# Patient Record
Sex: Female | Born: 1955 | Race: White | Hispanic: No | Marital: Married
Health system: Midwestern US, Community
[De-identification: ages and names within clinical notes are randomized; demographics above are authoritative.]

## PROBLEM LIST (undated history)

## (undated) DIAGNOSIS — M47816 Spondylosis without myelopathy or radiculopathy, lumbar region: Secondary | ICD-10-CM

## (undated) DIAGNOSIS — Z01818 Encounter for other preprocedural examination: Secondary | ICD-10-CM

## (undated) DIAGNOSIS — Z1231 Encounter for screening mammogram for malignant neoplasm of breast: Secondary | ICD-10-CM

## (undated) DIAGNOSIS — M961 Postlaminectomy syndrome, not elsewhere classified: Secondary | ICD-10-CM

## (undated) DIAGNOSIS — H9390 Unspecified disorder of ear, unspecified ear: Secondary | ICD-10-CM

## (undated) DIAGNOSIS — M461 Sacroiliitis, not elsewhere classified: Secondary | ICD-10-CM

## (undated) DIAGNOSIS — R52 Pain, unspecified: Secondary | ICD-10-CM

## (undated) DIAGNOSIS — R9230 Dense breasts, unspecified: Secondary | ICD-10-CM

## (undated) DIAGNOSIS — G894 Chronic pain syndrome: Secondary | ICD-10-CM

## (undated) DIAGNOSIS — R748 Abnormal levels of other serum enzymes: Secondary | ICD-10-CM

## (undated) DIAGNOSIS — R922 Inconclusive mammogram: Secondary | ICD-10-CM

## (undated) DIAGNOSIS — R079 Chest pain, unspecified: Secondary | ICD-10-CM

## (undated) DIAGNOSIS — Z1239 Encounter for other screening for malignant neoplasm of breast: Secondary | ICD-10-CM

## (undated) DIAGNOSIS — H6592 Unspecified nonsuppurative otitis media, left ear: Secondary | ICD-10-CM

## (undated) DIAGNOSIS — M4800 Spinal stenosis, site unspecified: Secondary | ICD-10-CM

## (undated) DIAGNOSIS — G4733 Obstructive sleep apnea (adult) (pediatric): Secondary | ICD-10-CM

## (undated) DIAGNOSIS — M5417 Radiculopathy, lumbosacral region: Secondary | ICD-10-CM

## (undated) DIAGNOSIS — Z9989 Dependence on other enabling machines and devices: Secondary | ICD-10-CM

## (undated) DIAGNOSIS — J209 Acute bronchitis, unspecified: Secondary | ICD-10-CM

## (undated) DIAGNOSIS — R519 Headache, unspecified: Secondary | ICD-10-CM

---

## 2009-08-25 LAB — GLOMERULAR FILTRATION RATE, ESTIMATED: Est, Glom Filt Rate: 75 mL/min/{1.73_m2} — AB

## 2009-08-25 LAB — LIPID PANEL
Cholesterol, Total: 175 mg/dl (ref 100–199)
HDL: 40 mg/dl
LDL Calculated: 92 mg/dl
Triglycerides: 216 mg/dl — ABNORMAL HIGH (ref 0–199)

## 2009-08-25 LAB — ELECTROLYTES
CO2: 29 meq/l (ref 23–33)
Chloride: 106 meq/l (ref 98–111)
Potassium: 3.9 meq/l (ref 3.5–5.2)
Sodium: 139 meq/l (ref 135–145)

## 2009-08-25 LAB — ALT/SGPT ALANINE TRANSAMINASE: ALT: 19 U/L (ref 11–66)

## 2009-08-25 LAB — GLUCOSE: Glucose: 115 mg/dl — ABNORMAL HIGH (ref 70–108)

## 2009-08-25 LAB — CREATININE: Creatinine: 0.8 mg/dl (ref 0.4–1.2)

## 2009-08-25 LAB — BUN: BUN: 16 mg/dl (ref 7–22)

## 2009-08-28 LAB — OCCULT BLOOD X 3, STOOL
Occult Blood Fecal: NEGATIVE
Occult Blood Fecal: NEGATIVE
Occult Blood Fecal: NEGATIVE

## 2010-02-09 NOTE — Progress Notes (Signed)
Subjective:      Patient ID: Theresa Vargas is a 55 y.o. female comes to the office today for a 6 month follow up for her ears. An Audiogram was completed on 11/04/09.    HPI SP LMAT 07-2009 (T-tube) by Dr Randell Loop.    Ears:         x No Complaints   Both  Right  Left     Onset:                            Insidious    Respiratory Infections    Barotrauma    Closed Head Injury    Ototoxic Drug Exposure    Noise Exposure    Previous Surgery       Ear Pain:   Pain Scale   /10 denies   Continuous    Intermittent     Precipitated by:    Relieved by:    Discharge from ear: denies   Mucoid    Pus    Mixed    Foul Smelling     Blood Stained     Hearing Loss: denies   Progressive    Improving    In status quo   x Poor discrimination in crowds    Hearing aids     Ringing in Ears: occasional buzzing   Pulsatile   x Non pulsatile    Continuous   x Intermittent    Sleep disturbance from tinnitus denies     Precipitated by:    Relieved by:    Dizziness: denies   Imbalances    Rotation    Nausea    Vomiting    Loss of COnsciousness    Positional    Duration     Previous Imaging:    Previous Labs:    Treatment so far:    Patient Active Problem List   Diagnoses   ??? OM (otitis media)   ??? ETD (eustachian tube dysfunction)       Past Medical History   Diagnosis Date   ??? Hypertension    ??? Psychiatric problem         Past Surgical History   Procedure Date   ??? Cholecystectomy 2007   ??? Hysterectomy 2007       Theresa Vargas does not currently have medications on file.    Allergies   Allergen Reactions   ??? Anesthetic Ether Other (See Comments)     Reaction to Local and general       Family History   Problem Relation Age of Onset   ??? Diabetes Other    ??? Hypertension Other    ??? Mental Illness Other    ??? Heart Disease Other    ??? Other Other      bone and joint problems       History     Social History   ??? Marital Status: Married     Spouse Name: N/A     Number of Children: N/A   ??? Years of Education: N/A     Occupational History   ??? Not on file.      Social History Main Topics   ??? Smoking status: Never Smoker    ??? Smokeless tobacco: Not on file   ??? Alcohol Use: No   ??? Drug Use: No   ??? Sexually Active: Not on file     Other Topics Concern   ??? Not on file  Social History Narrative   ??? No narrative on file         Review of Systems    General ROS: negative for - chills, fever or recent illness  ENT ROS: As in HPI  Allergy and Immunology ROS: negative for - nasal congestion, postnasal drip or seasonal allergies  Respiratory ROS: no cough, shortness of breath, or wheezing  Cardiovascular ROS: no chest pain or dyspnea on exertion  Neurological ROS: negative for - dizziness or headaches            Objective:   Physical Exam    Blood pressure 122/90, pulse 70, temperature 98.5 ??F (36.9 ??C), temperature source Oral, resp. rate 12, height 5\' 6"  (1.676 m), weight 184 lb (83.462 kg).    General appearance - alert, well appearing, and in no distress, oriented to person, place, and time and normal appearing weight  Mental status - alert, oriented to person, place, and time, normal mood, behavior, speech, dress, motor activity, and thought processes  Eyes - pupils equal and reactive, extraocular eye movements intact  Ears - Right: normal pinnae, eac, and pearly grey TM, left PET intact and aerated  Nose - normal and patent, no erythema, discharge or polyps and septal deviation to right  Mouth - mucous membranes moist, pharynx normal without lesions  Neck - supple, no significant adenopathy  Chest - clear to auscultation, no wheezes, rales or rhonchi, symmetric air entry  Heart - normal rate, regular rhythm, normal S1, S2, no murmurs, rubs, clicks or gallops  Neurological - alert, oriented, normal speech, no focal findings or movement disorder noted    Assessment:      1. OM (otitis media)    2. ETD (eustachian tube dysfunction)            Plan:      I have discussed all treatment options.  Reviewed audiogram and tympanogram. Audio: normal hearing to high FReq HL at 8000  HZ, SDS: 100:96, Tympanogram:as, B consistent with open PET    No orders of the defined types were placed in this encounter.       I will follow up with the patient in the office in 6 months

## 2010-02-23 LAB — HEMOGLOBIN A1C
AVERAGE GLUCOSE: 123 mg/dl (ref 70–126)
Hemoglobin A1C: 6.1 % (ref 4.4–6.4)

## 2010-02-23 LAB — LIPID PANEL
Cholesterol, Total: 213 mg/dl — ABNORMAL HIGH (ref 100–199)
HDL: 42 mg/dl
LDL Calculated: 114 mg/dl
Triglycerides: 285 mg/dl — ABNORMAL HIGH (ref 0–199)

## 2010-02-23 LAB — ELECTROLYTES
CO2: 27 meq/l (ref 23–33)
Chloride: 107 meq/l (ref 98–111)
Potassium: 4.2 meq/l (ref 3.5–5.2)
Sodium: 139 meq/l (ref 135–145)

## 2010-02-23 LAB — GLOMERULAR FILTRATION RATE, ESTIMATED: Est, Glom Filt Rate: 88 mL/min/{1.73_m2} — AB

## 2010-02-23 LAB — BUN: BUN: 10 mg/dl (ref 7–22)

## 2010-02-23 LAB — GLUCOSE: Glucose: 110 mg/dl — ABNORMAL HIGH (ref 70–108)

## 2010-02-23 LAB — CREATININE: Creatinine: 0.7 mg/dl (ref 0.4–1.2)

## 2010-02-23 LAB — TSH: TSH: 1.668 mcIU/ml (ref 0.400–4.200)

## 2010-02-23 LAB — ALT/SGPT ALANINE TRANSAMINASE: ALT: 31 U/L (ref 11–66)

## 2010-08-11 MED ORDER — MECLIZINE HCL 25 MG PO TABS
25 MG | ORAL_TABLET | Freq: Three times a day (TID) | ORAL | Status: AC | PRN
Start: 2010-08-11 — End: 2010-09-10

## 2010-08-11 NOTE — Patient Instructions (Signed)
Post op care of ear tubes:purpose of tubes in the ear is to equalize the pressure in the ears and improve air conduction for hearing. The ears should be protected from water and soap by using vaseline on a cotton ball, or ear plugs before showers, swimming, or shampooing. If pt experiences drainage (milky white, bloody, green, or foul-smelling) from the ears pt needs to be seen in ENT office. Please call ENT if experiencing ear ache or decreased hearing.

## 2010-08-11 NOTE — Progress Notes (Signed)
Subjective:      Patient ID: Theresa Vargas is a 55 y.o. female.    HPISandra A Vargas is a 55 y.o. female comes to the office today for a 6 month follow up for her ears. An Audiogram was completed on 11/04/09.   HPI SP Theresa Vargas 07-2009 (T-tube) by Dr Randell Loop. Pt reports intermittent dizziness "lightheadedness" comes on insidiously lasts a few minutes. Reports taking ativan during the day seems to relieve her symptoms. Takes antivert if it occurs at hs.   Ears:     No Complaints   Both   Right   Left    Onset:    Insidious     Respiratory Infections     Barotrauma     Closed Head Injury     Ototoxic Drug Exposure     Noise Exposure    x Previous Surgery Theresa Vargas x2   Ear Pain: Pain Scale /10 denies    Continuous     Intermittent    Precipitated by:   Relieved by:   Discharge from ear: denies    Mucoid     Pus     Mixed     Foul Smelling     Blood Stained    Hearing Loss: denies    Progressive     Improving     In status quo    x  Poor discrimination in crowds     Hearing aids    Ringing in Ears: reports tinnitus decreased   Pulsatile    x  Non pulsatile     Continuous    x  Intermittent     Sleep disturbance from tinnitus denies    Precipitated by:   Relieved by:   Dizziness: reports occasional lightheadedness    Imbalances     Rotation denies      Nausea denies      Vomiting denies      Loss of COnsciousness     Positional    x Duration lasting a few minutes   Previous Imaging:   Previous Labs:   Treatment so far: ativan and antivert, Theresa Vargas  Patient Active Problem List    Diagnoses    ???  OM (otitis media)    ???  ETD (eustachian tube dysfunction)      Past Medical History    Diagnosis  Date    ???  Hypertension     ???  Psychiatric problem       Past Surgical History    Procedure  Date    ???  Cholecystectomy  2007    ???  Hysterectomy  2007      Theresa Vargas does not currently have medications on file.   Allergies    Allergen  Reactions    ???  Anesthetic Ether  Other (See Comments)      Reaction to Local and general      Family History     Problem  Relation  Age of Onset    ???  Diabetes  Other     ???  Hypertension  Other     ???  Mental Illness  Other     ???  Heart Disease  Other     ???  Other  Other         bone and joint problems      History      Social History    ???  Marital Status:  Married      Spouse Name:  N/A  Number of Children:  N/A    ???  Years of Education:  N/A      Occupational History    ???  Not on file.      Social History Main Topics    ???  Smoking status:  Never Smoker    ???  Smokeless tobacco:  Not on file    ???  Alcohol Use:  No    ???  Drug Use:  No    ???  Sexually Active:  Not on file      Other Topics  Concern    ???  Not on file      Social History Narrative    ???  No narrative on file        Review of Systems  General ROS: negative for - chills, fever, sleep disturbance, weight gain, weight loss or recent illness  ENT ROS: negative  Cardiovascular ROS: no chest pain or dyspnea on exertion  Respiratory ROS: no cough, shortness of breath, or wheezing  Gastrointestinal ROS: negative  Skin ROS:  negative  Neurological ROS: positive for - dizziness, visual changes and reports vision seems worse the last year sees her eye doctor yearly  negative for - headaches  Psychiatric ROS:  positive for depressed mood, anxiety and stable with medications  Hematologic ROS:  No abnormal bleeding and No chills  Allergy and Immunology ROS: positive cats, runny nose watery eyes and nose                        Objective:   Physical Exam  Blood pressure 108/72, pulse 88, temperature 98.2 ??F (36.8 ??C), temperature source Oral, resp. rate 16, height 5\' 6"  (1.676 m), weight 180 lb (81.647 kg).    General appearance - alert, well appearing, and in no distress, oriented to person, place, and time and normal appearing weight  Mental status - alert, oriented to person, place, and time, normal mood, behavior, speech, dress, motor activity, and thought processes  Eyes - pupils equal and reactive, extraocular eye movements intact  Ears - normal pinnae, eac, right:EAC  narrow, TM pearly gray and translucent, left: pet intact and appears aerated  Nose - normal and patent, no erythema, discharge or polyps and clear rhinorrhea  Mouth - mucous membranes moist, pharynx normal without lesions  Neck - supple, no significant adenopathy  Chest - clear to auscultation, no wheezes, rales or rhonchi, symmetric air entry  Heart - normal rate, regular rhythm, normal S1, S2, no murmurs, rubs, clicks or gallops  Neurological - alert, oriented, normal speech, no focal findings or movement disorder noted    Assessment:      1. OM (otitis media)    2. ETD (eustachian tube dysfunction)    3. Dizziness and giddiness            Plan:      I have discussed all treatment options.  Encouraged to follow-up with optometrist. Post op care of ear tubes:purpose of tubes in the ear is to equalize the pressure in the ears and improve air conduction for hearing. The ears should be protected from water and soap by using vaseline on a cotton ball, or ear plugs before showers, swimming, or shampooing. If pt experiences drainage (milky white, bloody, green, or foul-smelling) from the ears pt needs to be seen in ENT office. Please call ENT if experiencing ear ache or decreased hearing.    No orders of the defined types were placed in  this encounter.   Conferred with dr Randell Loop.   Pt fitted for Doc Proplugs: size Adult Small  I will follow up with the patient in the office in 6 months

## 2010-08-31 LAB — ALT/SGPT ALANINE TRANSAMINASE: ALT: 27 U/L (ref 11–66)

## 2010-08-31 LAB — LIPID PANEL
Cholesterol, Total: 191 mg/dl (ref 100–199)
HDL: 40 mg/dl
LDL Calculated: 115 mg/dl
Triglycerides: 180 mg/dl (ref 0–199)

## 2010-08-31 LAB — ELECTROLYTE PANEL
CO2: 31 meq/l (ref 23–33)
Chloride: 106 meq/l (ref 98–111)
Potassium: 4.2 meq/l (ref 3.5–5.2)
Sodium: 143 meq/l (ref 135–145)

## 2010-08-31 LAB — GLUCOSE, RANDOM: Glucose: 117 mg/dl — ABNORMAL HIGH (ref 70–108)

## 2010-08-31 LAB — GLOMERULAR FILTRATION RATE, ESTIMATED: Est, Glom Filt Rate: 88 mL/min/{1.73_m2} — AB

## 2010-08-31 LAB — BUN: BUN: 11 mg/dl (ref 7–22)

## 2010-08-31 LAB — CREATININE: Creatinine: 0.7 mg/dl (ref 0.4–1.2)

## 2010-11-03 LAB — CBC
Hematocrit: 42.6 % (ref 37.0–47.0)
Hemoglobin: 13.9 gm/dl (ref 12.0–16.0)
MCH: 29 pg (ref 27.0–31.0)
MCHC: 32.5 gm/dl — ABNORMAL LOW (ref 33.0–37.0)
MCV: 89.3 fL (ref 81.0–99.0)
MPV: 7.2 mcm — ABNORMAL LOW (ref 7.4–10.4)
Platelets: 344 10*3/uL (ref 130–400)
RBC: 4.77 10*6/uL (ref 4.20–5.40)
RDW: 13.1 % (ref 11.5–14.5)
WBC: 6.9 10*3/uL (ref 4.8–10.8)

## 2010-11-03 LAB — BASIC METABOLIC PANEL
BUN: 15 mg/dl (ref 7–22)
CO2: 29 meq/l (ref 23–33)
Calcium: 9.4 mg/dl (ref 8.5–10.5)
Chloride: 104 meq/l (ref 98–111)
Creatinine: 0.9 mg/dl (ref 0.4–1.2)
Glucose: 96 mg/dl (ref 70–108)
Potassium: 3.7 meq/l (ref 3.5–5.2)
Sodium: 142 meq/l (ref 135–145)

## 2010-11-03 LAB — GLOMERULAR FILTRATION RATE, ESTIMATED: Est, Glom Filt Rate: 66 mL/min/{1.73_m2} — AB

## 2010-11-04 LAB — EKG 12-LEAD
Atrial Rate: 74 {beats}/min
P Axis: 56 degrees
P-R Interval: 160 ms
Q-T Interval: 382 ms
QRS Duration: 82 ms
QTc Calculation (Bazett): 424 ms
R Axis: 57 degrees
T Axis: 58 degrees
Ventricular Rate: 74 {beats}/min

## 2010-11-04 MED ORDER — DEXTROSE 5 % IV SOLN (MINI-BAG)
5 % | INTRAVENOUS | Status: DC
Start: 2010-11-04 — End: 2010-11-07
  Administered 2010-11-05: 16:00:00 via INTRAVENOUS

## 2010-11-04 NOTE — Patient Instructions (Signed)
Instructed patient to take med the morning of procedure Instructed patient to take med the morning of procedure

## 2010-11-04 NOTE — Patient Instructions (Signed)
Bring insurance info and drivers license  Wear comfortable clean clothing  Do not bring jewelry or valuables  Shower night before and morning of surgery with a liquid antibacterial soap

## 2010-11-04 NOTE — H&P (Signed)
[facility]                                    [place], [place]                             PREOPERATIVE HISTORY and PHYSICAL     PATIENT NAME: Theresa Vargas, Theresa Vargas              DOB:  [birth year]  MEDICAL RECORD NO: [id number]                 ROOM: SD  ACCOUNT NO: [id number]                          DATE: 11/05/2010  PHYSICIAN: Margret Chance, M.D.        DATE OF SCHEDULED PROCEDURE:  11/05/2010.     PREOPERATIVE DIAGNOSIS:  1.  Thrombosed hemorrhoids, internal hemorrhoids.     SCHEDULED PROCEDURE:  Excision of thrombosed external hemorrhoids, rectal  examination under anesthesia, possible internal hemorrhoidal banding versus  surgical hemorrhoidectomy.     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 who has been having problems with hemorrhoids  for some time, mostly with complaints of bleeding and occasional anorectal  pain.  She has been treated for this by Dr. Rickey Primus with banding in his  office.  She states she had severe pain with the last episode of banding and  developed a very large thrombosed external hemorrhoid.  It is at least 3 cm  in size in the right posterior position.  Due to her significant pain, she  desires not to proceed with watchful waiting at this point.  She was informed  that as this is an external hemorrhoid, it cannot be banded and needs to be  surgically excised.  To this she agreed.  She desired that if there are any  other significant internal hemorrhoids to go ahead and proceed with either  banding or hemorrhoidectomy as well.     PAST MEDICAL HISTORY: Significant for panic disorder, depression, cervical  disk disease.     PAST SURGERIES:  Hysterectomy, cholecystectomy, ACDF, levels unknown.     SOCIAL HABITS:  She denies tobacco or alcohol use.     FAMILY HISTORY:  Significant for diabetes.     HOME MEDICATIONS:  Cozaar 100 mg daily.  She has non-insulin dependent  diabetes as well, metformin 500  mg in the morning, 1000 mg in the afternoon,  Pristiq 100 mg daily, Ativan 1 mg p.r.n., Ambien 20 mg p.r.n. at bedtime,  fish oil, Ultram and Citracal.     DRUG ALLERGIES:  None known.     IMPRESSION:  1.  Large thrombosed, symptomatic external hemorrhoid.     PLAN:  Hemorrhoidectomy and examination of internal hemorrhoids under  anesthesia and internal hemorrhoidectomy as indicated.  Risks include but not  limited to bleeding, infection, pain and recurrence were all discussed with  [facility] in the office.  She expressed understanding and wishes to proceed.     Margret Chance, M.D.        D: 11/04/2010 12:50  T: 11/04/2010 13:06  lgl     CC:  Margret Chance, M.D.                    Robert L. Rickey Primus, M.D.  [address]                  [facility]  [address]                             [address]  [place], [place] [address]                        [place], [place] [address]     Beverly Gust. Bowlus, M.D.  [address]  [place], [place] [address]

## 2010-11-04 NOTE — Patient Instructions (Signed)
1000 left message and instructions, loaded info from Dr. Kandis Fantasialt's office

## 2010-11-05 LAB — POTASSIUM: Potassium: 4 meq/l (ref 3.5–5.2)

## 2010-11-05 MED ORDER — SODIUM CHLORIDE 0.9 % IJ SOLN
0.9 % | INTRAMUSCULAR | Status: DC | PRN
Start: 2010-11-05 — End: 2010-11-05

## 2010-11-05 MED ORDER — ACETAMINOPHEN 325 MG PO TABS
325 MG | ORAL | Status: DC | PRN
Start: 2010-11-05 — End: 2010-11-07

## 2010-11-05 MED ORDER — SODIUM CHLORIDE 0.9 % IV SOLN
0.9 % | INTRAVENOUS | Status: DC
Start: 2010-11-05 — End: 2010-11-05
  Administered 2010-11-05 (×2): via INTRAVENOUS

## 2010-11-05 MED ORDER — LIDOCAINE HCL (CARDIAC) 20 MG/ML IV SOLN
20 MG/ML | INTRAVENOUS | Status: AC
Start: 2010-11-05 — End: ?

## 2010-11-05 MED ORDER — GELATIN ABSORBABLE 12-7 MM EX MISC
12-7 MM | CUTANEOUS | Status: AC
Start: 2010-11-05 — End: ?

## 2010-11-05 MED ORDER — ONDANSETRON HCL 4 MG/2ML IJ SOLN
4 MG/2ML | Freq: Four times a day (QID) | INTRAMUSCULAR | Status: DC | PRN
Start: 2010-11-05 — End: 2010-11-07

## 2010-11-05 MED ORDER — SCOPOLAMINE 1 MG/3DAYS TD PT72
1 MG/3DAYS | TRANSDERMAL | Status: AC
Start: 2010-11-05 — End: ?

## 2010-11-05 MED ORDER — DIBUCAINE 1 % RE OINT
1 % | RECTAL | Status: AC
Start: 2010-11-05 — End: ?

## 2010-11-05 MED ORDER — PROMETHAZINE HCL 25 MG/ML IJ SOLN
25 MG/ML | INTRAMUSCULAR | Status: DC | PRN
Start: 2010-11-05 — End: 2010-11-07

## 2010-11-05 MED ORDER — NALOXONE HCL 0.4 MG/ML IJ SOLN
0.4 MG/ML | INTRAMUSCULAR | Status: DC | PRN
Start: 2010-11-05 — End: 2010-11-07

## 2010-11-05 MED ORDER — CEFOXITIN SODIUM 1 G IV SOLR
1 g | INTRAVENOUS | Status: DC
Start: 2010-11-05 — End: 2010-11-07

## 2010-11-05 MED ORDER — HYDROCODONE-ACETAMINOPHEN 5-325 MG PO TABS
5-325 MG | ORAL | Status: AC
Start: 2010-11-05 — End: 2010-11-05
  Administered 2010-11-05: 17:00:00 via ORAL

## 2010-11-05 MED ORDER — MORPHINE SULFATE (PF) 2 MG/ML IJ SOLN
2 MG/ML | INTRAMUSCULAR | Status: AC
Start: 2010-11-05 — End: 2010-11-06

## 2010-11-05 MED ORDER — FENTANYL CITRATE 0.05 MG/ML IJ SOLN
0.05 MG/ML | INTRAMUSCULAR | Status: DC | PRN
Start: 2010-11-05 — End: 2010-11-07
  Administered 2010-11-05 (×2): 25 ug via INTRAVENOUS

## 2010-11-05 MED ORDER — DEXTROSE 50 % IV SOLN
50 % | INTRAVENOUS | Status: DC | PRN
Start: 2010-11-05 — End: 2010-11-07

## 2010-11-05 MED ORDER — SODIUM CHLORIDE 0.9 % IJ SOLN
0.9 % | Freq: Two times a day (BID) | INTRAMUSCULAR | Status: DC
Start: 2010-11-05 — End: 2010-11-07

## 2010-11-05 MED ORDER — DEXTROSE 5 % IV SOLN
5 % | INTRAVENOUS | Status: DC | PRN
Start: 2010-11-05 — End: 2010-11-07

## 2010-11-05 MED ORDER — ACETAMINOPHEN 650 MG RE SUPP
650 MG | RECTAL | Status: DC | PRN
Start: 2010-11-05 — End: 2010-11-07

## 2010-11-05 MED ORDER — HYDROCODONE-ACETAMINOPHEN 5-325 MG PO TABS
5-325 MG | Freq: Four times a day (QID) | ORAL | Status: DC | PRN
Start: 2010-11-05 — End: 2010-11-07
  Administered 2010-11-05: 18:00:00 1 via ORAL

## 2010-11-05 MED ORDER — ONDANSETRON HCL 4 MG/2ML IJ SOLN
4 MG/2ML | Freq: Once | INTRAMUSCULAR | Status: DC | PRN
Start: 2010-11-05 — End: 2010-11-07

## 2010-11-05 MED ORDER — MORPHINE SULFATE (PF) 2 MG/ML IJ SOLN
2 MG/ML | INTRAMUSCULAR | Status: DC | PRN
Start: 2010-11-05 — End: 2010-11-07
  Administered 2010-11-05 (×2): 2 mg via INTRAVENOUS

## 2010-11-05 MED ORDER — SODIUM CHLORIDE 0.9 % IV SOLN
0.9 % | INTRAVENOUS | Status: DC
Start: 2010-11-05 — End: 2010-11-07

## 2010-11-05 MED ORDER — ONDANSETRON HCL 4 MG/2ML IJ SOLN
4 MG/2ML | INTRAMUSCULAR | Status: AC
Start: 2010-11-05 — End: ?

## 2010-11-05 MED ORDER — SODIUM CHLORIDE 0.9 % IJ SOLN
0.9 % | INTRAMUSCULAR | Status: DC | PRN
Start: 2010-11-05 — End: 2010-11-07

## 2010-11-05 MED ORDER — SODIUM CHLORIDE 0.9 % IJ SOLN
0.9 % | Freq: Two times a day (BID) | INTRAMUSCULAR | Status: DC
Start: 2010-11-05 — End: 2010-11-05

## 2010-11-05 MED ORDER — HYDROCODONE-ACETAMINOPHEN 5-325 MG PO TABS
5-325 MG | ORAL | Status: DC | PRN
Start: 2010-11-05 — End: 2010-11-07

## 2010-11-05 MED ORDER — MORPHINE SULFATE 4 MG/ML IJ SOLN
4 MG/ML | INTRAMUSCULAR | Status: DC | PRN
Start: 2010-11-05 — End: 2010-11-07

## 2010-11-05 MED ORDER — GLUCAGON HCL (RDNA) 1 MG IJ SOLR
1 MG | INTRAMUSCULAR | Status: DC | PRN
Start: 2010-11-05 — End: 2010-11-07

## 2010-11-05 MED ORDER — MIDAZOLAM HCL 2 MG/2ML IJ SOLN
2 MG/ML | INTRAMUSCULAR | Status: AC
Start: 2010-11-05 — End: ?

## 2010-11-05 MED ORDER — DEXAMETHASONE SODIUM PHOSPHATE 4 MG/ML IJ SOLN
4 MG/ML | INTRAMUSCULAR | Status: AC
Start: 2010-11-05 — End: ?

## 2010-11-05 MED ORDER — MEPERIDINE HCL 25 MG/ML IJ SOLN
25 MG/ML | INTRAMUSCULAR | Status: DC | PRN
Start: 2010-11-05 — End: 2010-11-07

## 2010-11-05 MED ORDER — PROPOFOL 10 MG/ML IV EMUL
10 MG/ML | INTRAVENOUS | Status: AC
Start: 2010-11-05 — End: ?

## 2010-11-05 MED ORDER — FENTANYL CITRATE 0.05 MG/ML IJ SOLN
0.05 MG/ML | INTRAMUSCULAR | Status: AC
Start: 2010-11-05 — End: ?

## 2010-11-05 MED ORDER — HYDROCODONE-ACETAMINOPHEN 5-325 MG PO TABS
5-325 MG | ORAL_TABLET | ORAL | Status: AC | PRN
Start: 2010-11-05 — End: 2010-11-12

## 2010-11-05 MED ORDER — FENTANYL CITRATE 0.05 MG/ML IJ SOLN
0.05 MG/ML | INTRAMUSCULAR | Status: AC
Start: 2010-11-05 — End: 2010-11-06

## 2010-11-05 MED ORDER — GELATIN ABSORBABLE 100 EX MISC
100 | CUTANEOUS | Status: AC
Start: 2010-11-05 — End: ?

## 2010-11-05 MED ORDER — MORPHINE SULFATE 10 MG/ML IJ SOLN
10 MG/ML | INTRAMUSCULAR | Status: AC
Start: 2010-11-05 — End: ?

## 2010-11-05 MED ORDER — HYDRALAZINE HCL 20 MG/ML IJ SOLN
20 MG/ML | INTRAMUSCULAR | Status: DC | PRN
Start: 2010-11-05 — End: 2010-11-07

## 2010-11-05 MED ORDER — BUPIVACAINE-EPINEPHRINE (PF) 0.5% -1:200000 IJ SOLN
INTRAMUSCULAR | Status: AC
Start: 2010-11-05 — End: ?

## 2010-11-05 MED ORDER — DEXTROSE 5 % IV SOLN
5 % | INTRAVENOUS | Status: DC
Start: 2010-11-05 — End: 2010-11-07

## 2010-11-05 MED FILL — FENTANYL CITRATE 0.05 MG/ML IJ SOLN: 0.05 MG/ML | INTRAMUSCULAR | Qty: 2

## 2010-11-05 MED FILL — HYDROCODONE-ACETAMINOPHEN 5-325 MG PO TABS: 5-325 mg | ORAL | Qty: 1

## 2010-11-05 MED FILL — ONDANSETRON HCL 4 MG/2ML IJ SOLN: 4 MG/2ML | INTRAMUSCULAR | Qty: 2

## 2010-11-05 MED FILL — ACETAMINOPHEN 325 MG PO TABS: 325 MG | ORAL | Qty: 2

## 2010-11-05 MED FILL — MIDAZOLAM HCL 2 MG/2ML IJ SOLN: 2 MG/ML | INTRAMUSCULAR | Qty: 2

## 2010-11-05 MED FILL — GELFOAM SPONGE SIZE 100 EX MISC: CUTANEOUS | Qty: 1

## 2010-11-05 MED FILL — DIBUCAINE 1 % RE OINT: 1 % | RECTAL | Qty: 28

## 2010-11-05 MED FILL — GELFOAM SPONGE 12-7 MM EX MISC: 12-7 MM | CUTANEOUS | Qty: 1

## 2010-11-05 MED FILL — DEXAMETHASONE SODIUM PHOSPHATE 4 MG/ML IJ SOLN: 4 MG/ML | INTRAMUSCULAR | Qty: 5

## 2010-11-05 MED FILL — LIDOCAINE HCL (CARDIAC) 20 MG/ML IV SOLN: 20 MG/ML | INTRAVENOUS | Qty: 5

## 2010-11-05 MED FILL — CEFOXITIN SODIUM 1 G IV SOLR: 1 g | INTRAVENOUS | Qty: 1

## 2010-11-05 MED FILL — MORPHINE SULFATE (PF) 2 MG/ML IJ SOLN: 2 MG/ML | INTRAMUSCULAR | Qty: 3

## 2010-11-05 MED FILL — DIPRIVAN 10 MG/ML IV EMUL: 10 MG/ML | INTRAVENOUS | Qty: 20

## 2010-11-05 MED FILL — MORPHINE SULFATE 10 MG/ML IJ SOLN: 10 MG/ML | INTRAMUSCULAR | Qty: 1

## 2010-11-05 MED FILL — DEXTROSE 5 % IV SOLN: 5 % | INTRAVENOUS | Qty: 50

## 2010-11-05 MED FILL — SENSORCAINE-MPF/EPINEPHRINE 0.5% -1:200000 IJ SOLN: INTRAMUSCULAR | Qty: 30

## 2010-11-05 MED FILL — TRANSDERM-SCOP (1.5 MG) 1 MG/3DAYS TD PT72: 1 MG/3DAYS | TRANSDERMAL | Qty: 1

## 2010-11-05 MED FILL — HYDROCODONE-ACETAMINOPHEN 5-325 MG PO TABS: 5-325 MG | ORAL | Qty: 1

## 2010-11-05 NOTE — Progress Notes (Signed)
Pt. Requesting 2nd pain pill.

## 2010-11-05 NOTE — H&P (Signed)
[facility]  History and Physical Update    Pt Name: Theresa Vargas  MRN: [id number]  Birthdate: [birth year]  Date of evaluation: 11/05/2010    [x]  I have examined the patient and reviewed the H&P/Consult and there are no changes to the patient or plans.    []  I have examined the patient and reviewed the H&P/Consult and have noted the following changes:        Latif Nazareno L. Egidio Lofgren MD  Electronically signed 11/05/2010 at 11:28 AM

## 2010-11-05 NOTE — Progress Notes (Signed)
Returned to SDS.  Alert.  Husband at bedside.  Soda and toast taken.

## 2010-11-05 NOTE — Anesthesia Post-Procedure Evaluation (Signed)
[facility]  POST-ANESTHESIA NOTE       Name:  Theresa Vargas                                         Age:  55 y.o.  MRN:  [id number]      Last Vitals:  BP 133/64  Pulse 84  Temp(Src) 97.4 F (36.3 C) (Temporal)  Resp 14  Ht 5\' 6"  (1.676 m)  Wt 177 lb (80.287 kg)  BMI 28.57 kg/m2  SpO2 100%  Patient Vitals for the past 4 hrs:   BP Temp Temp src Pulse Resp SpO2   11/05/10 1250 133/64 mmHg - - 84  14  100 %   11/05/10 1245 146/69 mmHg - - 87  10  99 %   11/05/10 1240 125/60 mmHg - - 77  8  100 %   11/05/10 1235 122/60 mmHg - - 81  10  99 %   11/05/10 1230 127/58 mmHg - - 84  12  95 %   11/05/10 1225 123/58 mmHg - - 86  12  95 %   11/05/10 1220 106/52 mmHg - - 87  11  97 %   11/05/10 1215 117/57 mmHg - - 89  10  97 %   11/05/10 1210 120/60 mmHg - - 101  21  95 %   11/05/10 1205 127/61 mmHg - - 105  24  95 %   11/05/10 1200 134/61 mmHg 97.4 F (36.3 C) Temporal 111  10  95 %       Level of Consciousness:  Awake    Respiratory:  Stable    Oxygen Saturation:  Stable    Cardiovascular:  Stable    Hydration:  Adequate    PONV:  Stable    Post-op Pain:  Adequate analgesia    Post-op Assessment:  No apparent anesthetic complications    Additional Follow-Up / Treatment / Comment:  None    Ronalee Belts. Quinnton Bury, DO  November 05, 2010   12:55 PM

## 2010-11-05 NOTE — Progress Notes (Signed)
Amb. To br, gait steady.  Voided qs urine.

## 2010-11-05 NOTE — Op Note (Signed)
[facility]  RECORD OF OPERATION       PATIENT NAME: Theresa Vargas  MEDICAL RECORD NO. @mrn   PROCEDURE DATE: 11/05/2010  SURGEON: Dema Severin. Pharrell Ledford MD  PRIMARY CARE PHYSICIAN: Fayrene Fearing T. Bowlus, MD        PREOPERATIVE DIAGNOSIS:  I external hemorrhoids, thrombosed.     POSTOPERATIVE DIAGNOSIS:  external hemorrhoidsthrombosed     PROCEDURE PERFORMED: hemorrhoidectomy external     SURGEON: Dashanae Longfield L. Tanesha Arambula MD     ANESTHESIA:  General with local.     ESTIMATED BLOOD LOSS: 5    ml.     SPECIMENS:  Hemorrhoids to Pathology.     COMPLICATIONS:  None immediately appreciated.     DISCUSSION:  [name] is a 55 y.o.-year-old female who presented to my office and seen in evaluation at the request of Dr. Seleta Rhymes regarding symptomatic internal  and external hemorrhoids.  After history and physical examination performed,  potential diagnostic and therapeutic modalities were discussed with the  patient.  Operative and nonoperative management was discussed.  Variations on  hemorrhoidal techniques were reviewed.  The patient was given the opportunity to ask  questions.  Once answered, informed was obtained.  The patient brought to the  operating room on 11/05/2010 for procedure.     PROCEDURE:  The patient was brought to the operating room and placed in the  dorsolithotomy position.  Placed under continuous cardiac telemetry, blood  pressure, and pulse oximetry monitoring.  Placed under general anesthesia by  the Anesthesia Department.  The perianal area was prepped and draped in  sterile fashion.  A digital rectal exam performed, again revealing the  Thrombosed external hemorrhoids.  Adequate sphincter tone.  No masses palpable.  A  Ferguson retractor was then placed within the rectum.  Visual inspection was  carried out.  He had a large anterior and1 posterior complexes on the right  and left respectively.  The Ferguson retractor was then moved, and bivalve  retractor was then placed, and attention taken to the anterior  hemorrhoid  first.  This was grasped and elevated using the Pennington forceps.  Internal  sphincter fibers were palpated.  An additional dissection carried out with  electrocautery, elevating the hemorrhoidal tissue off theexternal sphincter.  The remainder of the hemorrhoid was then removed using the Harmonic Scalpel.  There appeared to be no evidence of sphincter injury, and the mucosal edge  was then re-approximated using 2-0 chromic suture in running fashion.  Adequate hemostasis appreciated.  In similar fashion, the left posterior  complexes were also removed.  And then following removal, the sphincter was  noted to be intact.  No evidence of bleeding was evident.  A Gelfoam pack  with triple antibiotic ointment placed in the rectum, and the wounds were  infiltrated with local aesthetic.  The wound was then cleansed.  Sterile  dressings were applied.  The patient brought out of anesthesia and  transferred to PACU in stable and satisfactory condition.  No immediate  complication evident.  All sponge, instrument, and needle counts were correct  at completion of procedure.     Postoperative findings were discussed with the patient's family.The patient was given  discharge instructions, prescriptions for analgesics, antibiotics, and will  follow up in my office in a 3 week period of time for evaluation.       Decorey Wahlert L. Melvenia Favela MD  Electronically signed @td  at 11:56 AM

## 2010-11-05 NOTE — Progress Notes (Signed)
1200 Arouses to verbal stimulus. Relaxed facial muscles. Sleepy. Denies pain.  1216 Complains of 6/10 rectal pain.   1217 Titrating analgesics.  1230 Rates rectal pain 2/10. Relaxed. SPO2 decreased to 90% while asleep. Oxygen started at 2 liters/ nasal cannula.  1232 SPO2 =97 %.  1240 Alert. Oriented. Comfortable. Sleeps for brief intervals.  1250 To SDS.

## 2010-11-05 NOTE — Progress Notes (Signed)
Admitted to SDS.  Oriented to room and surgery.

## 2010-11-05 NOTE — Progress Notes (Signed)
Up in room, dressed.  Pt. States ready for discharge, rectal pain easing, 4/10.  Pt. And husband voice understanding of discharge instructions.

## 2010-11-05 NOTE — Anesthesia Pre-Procedure Evaluation (Signed)
Kennedy Bucker  [facility]  PRE-ANESTHESIA EVALUATION FORM       Name:  Theresa Vargas                                         Age:  55 y.o.  MRN:  [id number]           Allergies   Allergen Reactions   . Anesthetic Ether Other (See Comments)     Reaction to Local and general     Patient Active Problem List   Diagnoses   . OM (otitis media)   . ETD (eustachian tube dysfunction)   . Dizziness and giddiness     Past Medical History   Diagnosis Date   . Hypertension    . Psychiatric problem    . Nausea & vomiting      Past Surgical History   Procedure Date   . Cholecystectomy 2007   . Hysterectomy 2007   . Neck surgery 2008 [facility]     ACDF   . Tympanostomy tube placement [id number]   . Skin biopsy 2007     left cheek   . Colonoscopy      2006   . Hemorrhoid surgery 05/2010     History   Substance Use Topics   . Smoking status: Never Smoker    . Smokeless tobacco: Not on file   . Alcohol Use: No     Medications  Current Outpatient Prescriptions on File Prior to Encounter   Medication Sig Dispense Refill   . Calcium Citrate (CITRACAL PO) Take 1 each by mouth 2 times daily. 2 am, 2pm        . Omega-3 Fatty Acids (FISH OIL) 1000 MG CAPS Take 1,000 mg by mouth daily. Hold last dose as of  10-1       . HYDROcodone-acetaminophen (NORCO) 5-325 MG per tablet Take 1 tablet by mouth every 6 hours as needed.         Marland Kitchen losartan (COZAAR) 50 MG tablet Take 100 mg by mouth daily.       Marland Kitchen desvenlafaxine (PRISTIQ) 50 MG TB24 Take 100 mg by mouth daily.       Marland Kitchen lorazepam (ATIVAN) 1 MG tablet Take 1 mg by mouth every 6 hours as needed.         . traMADol (ULTRAM) 50 MG tablet Take 50-100 mg by mouth every 6 hours as needed.         . fluoxetine (PROZAC) 40 MG capsule Take 40 mg by mouth daily.       . metformin (GLUCOPHAGE) 1000 MG tablet Take 1,000 mg by mouth Daily with supper.       . metformin (GLUCOPHAGE) 500 MG tablet Take 500 mg by mouth daily (with breakfast).       Marland Kitchen zolpidem (AMBIEN) 10 MG tablet Take 10  mg by mouth nightly as needed.           No current facility-administered medications on file prior to encounter.     Current Outpatient Prescriptions   Medication Sig Dispense Refill   . Calcium Citrate (CITRACAL PO) Take 1 each by mouth 2 times daily. 2 am, 2pm        . Omega-3 Fatty Acids (FISH OIL) 1000 MG CAPS Take 1,000 mg by mouth daily. Hold last dose as of  10-1       .  HYDROcodone-acetaminophen (NORCO) 5-325 MG per tablet Take 1 tablet by mouth every 6 hours as needed.         Marland Kitchen losartan (COZAAR) 50 MG tablet Take 100 mg by mouth daily.       Marland Kitchen desvenlafaxine (PRISTIQ) 50 MG TB24 Take 100 mg by mouth daily.       Marland Kitchen lorazepam (ATIVAN) 1 MG tablet Take 1 mg by mouth every 6 hours as needed.         . traMADol (ULTRAM) 50 MG tablet Take 50-100 mg by mouth every 6 hours as needed.         . fluoxetine (PROZAC) 40 MG capsule Take 40 mg by mouth daily.       . metformin (GLUCOPHAGE) 1000 MG tablet Take 1,000 mg by mouth Daily with supper.       . metformin (GLUCOPHAGE) 500 MG tablet Take 500 mg by mouth daily (with breakfast).       Marland Kitchen zolpidem (AMBIEN) 10 MG tablet Take 10 mg by mouth nightly as needed.           Current Facility-Administered Medications   Medication Dose Route Frequency Provider Last Rate Last Dose   . 0.9%  NaCl infusion   Intravenous Continuous Salina April, MD 125 mL/hr at 11/05/10 [phone]     . sodium chloride (PF) 0.9 % injection 10 mL  10 mL Intravenous Q12H [facility] Salina April, MD       . sodium chloride (PF) 0.9 % injection 10 mL  10 mL Intravenous PRN Salina April, MD       . cefOXitin (MEFOXIN) 1 g in dextrose 5% 50 mL (mini-bag)  1 g Intravenous 60 Min Pre-Op Salina April, MD         Vitals   Filed Vitals:    11/05/10 0835   BP: 131/76   Pulse: 93   Temp: 97.6 F (36.4 C)   Resp: 20     BP Readings from Last 3 Encounters:   11/05/10 131/76   08/11/10 108/72   02/09/10 122/90     BMI  Ht Readings from Last 1 Encounters:   11/05/10 5\' 6"  (1.676 m)     Wt Readings from Last 1 Encounters:   11/05/10 177  lb (80.287 kg)     Body mass index is 28.57 kg/(m^2).  Estimated Body mass index is 28.57 kg/(m^2) as calculated from the following:    Height as of this encounter: 5\' 6" (1.676 m).    Weight as of this encounter: 177 lb(80.287 kg).    CBC   Lab Results   Component Value Date    WBC 6.9 11/03/2010    RBC 4.77 11/03/2010    HGB 13.9 11/03/2010    HCT 42.6 11/03/2010    MCV 89.3 11/03/2010    RDW 13.1 11/03/2010    PLT 344 11/03/2010     CMP    Lab Results   Component Value Date    NA 142 11/03/2010    K 4.0 11/05/2010    CL 104 11/03/2010    CO2 29 11/03/2010    BUN 15 11/03/2010    BUN 10 02/23/2010    CREATININE 0.9 11/03/2010    CREATININE 0.7 02/23/2010    LABGLOM 66 11/03/2010    GLUCOSE 96 11/03/2010    GLUCOSE 110 02/23/2010    CALCIUM 9.4 11/03/2010    ALT 27 08/31/2010     BMP    Lab Results   Component Value Date  NA 142 11/03/2010    K 4.0 11/05/2010    CL 104 11/03/2010    CO2 29 11/03/2010    BUN 15 11/03/2010    BUN 10 02/23/2010    CREATININE 0.9 11/03/2010    CREATININE 0.7 02/23/2010    CALCIUM 9.4 11/03/2010    LABGLOM 66 11/03/2010    GLUCOSE 96 11/03/2010    GLUCOSE 110 02/23/2010     POCGlucose  Recent Labs   Basename 11/03/10 1026    GLUCOSE 96      Coags    No results found for this basename: PROTIME, INR, APTT     HCG (If Applicable)   No results found for this basename: PREGTESTUR, PREGSERUM, HCG, HCGQUANT      ABGs   No results found for this basename: PHART, PO2ART, PCO2ART, HCO3ART, BEART, O2SATART      Type & Screen (If Applicable)  No results found for this basename: LABABO, LABRH     EKG: NSR    CXR (If Applicable)   Cardiac Testing (If Applicable)      Anesthesia Evaluation     Patient summary reviewed    History of anesthetic complications (ponv)   Airway   Mallampati: II  TM distance: >3 FB  Neck ROM: full  Dental      Pulmonary - normal exam   Cardiovascular - normal exam    Neuro/Psych    GI/Hepatic/Renal      Endo/Other    (+) type II diabetes  Abdominal   (-) obese  Abdomen: soft.                  Allergies:  Anesthetic ether    NPO Status: Time of last liquid consumption: 2330                            Anesthesia Plan    ASA 2     general     intravenous induction   Anesthetic plan and risks discussed with patient.    Plan discussed with attending.          Juliet Rude  11/05/2010        DOS STAFF ADDENDUM:    Chart reviewed.  Pt seen and examined.  Plan as above, discussed with CRNA.    Discussed risks, benefits, alternatives, and personnel involved with patient, who agrees to proceed.      Ronalee Belts Abbegail Matuska, DO  Staff Anesthesiologist  1:00 PM

## 2010-11-05 NOTE — Progress Notes (Signed)
To surgery per cart.

## 2010-11-05 NOTE — Discharge Instructions (Addendum)
DR OLT'S DISCHARGE INSTRUCTIONS    Pt Name: Theresa Vargas  Medical Record Number: [id number]  Today's Date: 11/05/2010    GENERAL ANESTHESIA OR SEDATION  1. Do not drive or operate hazardous machinery for 24 hours.  2. Do not make important business or personal decisions for 24 hours.  3. Do not drink alcoholic beverages or use tobacco for 24 hours.    ACTIVITY INSTRUCTIONS:  []  Rest today. Resume light to normal activity tomorrow.   []  You may resume normal activity tomorrow. Do not engage in strenuous activity that may place stress on your incision.  [x]  Do not drive for 3-5 days and avoid heavy lifting, tugging, pullings greater than 10-20 lbs until seen in the office.      DIET INSTRUCTIONS:  [] Begin with clear liquids. If not nauseated, may increase to a low-fat diet when you desire. Greasy and spicy foods are not advised.  [x] Regular diet as tolerated.  [] Other:     MEDICATIONS  [x] Prescription sent with you to be used as directed.   [] Lortab   [x] Vicodin   [] Percocet   [] Tylenol #3   [] Oxycontin   Do not drink alcohol or drive while taking these medications. You may experience dizziness or drowsiness with these medications. You may also experience constipation which can be relieved with stool softners or laxatives.  [x] You may resume your daily prescription medication schedule unless otherwise specified.  [x] Do not take 325mg  Aspirin or other blood thinners such as Coumadin or Plavix for 5 days.     WOUND/DRESSING INSTRUCTIONS:  Always ensure you and your care giver clean hands before and after caring for the wound.  []  Keep dressing clean and dry for 48 hours. Change when soiled or wet.      []  Allow steri-strips to fall off on their own.   []  Ice operative site for 20 minutes 4 times a day.     []  May wash over incision in shower in 48 hours, but do not soak in a bath.  [x]  Take sitz bath for 20 minutes twice daily and after bowel movements.  []  Keep the abdominal binder in place during the day. May remove  to shower and at night.  []  Remove packing from wound in 24 hours and replace with AMD dressings daily.  []  Empty JP drain daily and record the amounts.    BREAST PROCEDURES  [] Following a breast procedure, it is important to continue to where supportive garments.  [] Following a sentinal lymph node biopsy, you should not be alarmed if your urine has a blue color to it. This is your body eliminating the dye used for the procedure.    ABDOMINAL/LAPAROSCOPIC SURGERY  [x] You are encouraged to get up and move around as this helps with the circulation and speeds up the healing process.  [x] Breath deeply and cough from time to time. This helps to clear your lungs and helps prevent pneumonia.  [] Supporting your incision with a pillow or your hand helps to minimize discomfort and pain.  [] Laparoscopic patients may develop shoulder pain in the first 48 hours from the gas used during the procedure.    FOLLOW-UP CARE. SPECIFICALLY WATCH FOR:   Fever over 101 degrees by mouth   Increased redness, warmth, hardness at operative site.   Blood soaked dressing (small amounts of oozing may be normal.)   Increased or progressive drainage from the surgical area   Inability to urinate or blood in the urine   Pain not relieved by the  medications ordered   Persistent nausea and/or vomiting, unable to retain fluids.    FOLLOW-UP APPOINTMENT   [] 1 week   [] 2 weeks   [x] Other 3 weeks  Call my office if you have any problem that concerns you [phone]. After hours, you can reach the answering service via the office phone number. IF YOU NEED IMMEDIATE ATTENTION, GO TO THE EMERGENCY ROOM AND YOUR DOCTOR WILL BE CONTACTED.    NORCO 2 TABS TAKEN AT [facility] 1:45PM      Sarah L. Olt MD  [address]  [place], [place] [address]  Electronically signed 11/05/2010 at 11:55 AM

## 2011-02-10 MED ORDER — HYDROCODONE-ACETAMINOPHEN 5-325 MG PO TABS
5-325 MG | ORAL_TABLET | Freq: Four times a day (QID) | ORAL | Status: DC | PRN
Start: 2011-02-10 — End: 2011-02-23

## 2011-02-10 MED ORDER — LIDOCAINE-EPINEPHRINE 1 %-1:100000 IJ SOLN
1 %-:00000 | Freq: Once | INTRAMUSCULAR | Status: AC
Start: 2011-02-10 — End: 2011-02-10
  Administered 2011-02-10: 15:00:00 via INTRADERMAL

## 2011-02-10 MED FILL — LIDOCAINE-EPINEPHRINE 1-1:100000 % IJ SOLN: INTRAMUSCULAR | Qty: 20

## 2011-02-10 NOTE — Discharge Instructions (Addendum)
DR OLT'S DISCHARGE INSTRUCTIONS    Pt Name: Theresa Vargas  Medical Record Number: [id number]  Today's Date: 02/10/2011    GENERAL ANESTHESIA OR SEDATION  1. Do not drive or operate hazardous machinery for 24 hours.  2. Do not make important business or personal decisions for 24 hours.  3. Do not drink alcoholic beverages or use tobacco for 24 hours.    ACTIVITY INSTRUCTIONS:   Rest today. Resume light to normal activity tomorrow.    You may resume normal activity tomorrow. Do not engage in strenuous activity that may place stress on your incision.   Do not drive for 3-5 days and avoid heavy lifting, tugging, pullings greater than 10-20 lbs until seen in the office.      DIET INSTRUCTIONS:  Begin with clear liquids. If not nauseated, may increase to a low-fat diet when you desire. Greasy and spicy foods are not advised.  Regular diet as tolerated.  Other:     MEDICATIONS  Prescription sent with you to be used as directed.   Lortab   Vicodin   Percocet   Tylenol #3   Oxycontin   Do not drink alcohol or drive while taking these medications. You may experience dizziness or drowsiness with these medications. You may also experience constipation which can be relieved with stool softners or laxatives.  You may resume your daily prescription medication schedule unless otherwise specified.  Do not take  Aspirin or other blood thinners such as Coumadin or Plavix for 5 days.     WOUND/DRESSING INSTRUCTIONS:  Always ensure you and your care giver clean hands before and after caring for the wound.   Keep dressing clean and dry for 48 hours. Change when soiled or wet.       Allow steri-strips to fall off on their own.    Ice operative site for 20 minutes 4 times a day.      May wash over incision in shower in 48 hours, but do not soak in a bath.   Take sitz bath for 20 minutes twice daily and after bowel movements.   Keep the abdominal binder in place during the day. May remove  to shower and at night.   Remove packing from wound in 24 hours and replace with AMD dressings daily.   Empty JP drain daily and record the amounts.    BREAST PROCEDURES  Following a breast procedure, it is important to continue to where supportive garments.  Following a sentinal lymph node biopsy, you should not be alarmed if your urine has a blue color to it. This is your body eliminating the dye used for the procedure.    ABDOMINAL/LAPAROSCOPIC SURGERY  You are encouraged to get up and move around as this helps with the circulation and speeds up the healing process.  Breath deeply and cough from time to time. This helps to clear your lungs and helps prevent pneumonia.  Supporting your incision with a pillow or your hand helps to minimize discomfort and pain.  Laparoscopic patients may develop shoulder pain in the first 48 hours from the gas used during the procedure.    FOLLOW-UP CARE. SPECIFICALLY WATCH FOR:   Fever over 101 degrees by mouth   Increased redness, warmth, hardness at operative site.   Blood soaked dressing (small amounts of oozing may be normal.)   Increased or progressive drainage from the surgical area   Inability to urinate or blood in the urine   Pain not relieved by the  medications ordered   Persistent nausea and/or vomiting, unable to retain fluids.    FOLLOW-UP APPOINTMENT   [] 1 week   [] 2 weeks   [x] Other  3 months or call for problems mon. April 8 @ 1000  Call my office if you have any problem that concerns you [phone]. After hours, you can reach the answering service via the office phone number. IF YOU NEED IMMEDIATE ATTENTION, GO TO THE EMERGENCY ROOM AND YOUR DOCTOR WILL BE CONTACTED.      Sarah L. Olt MD  [address]  [place], [place] [id number]  Electronically signed 02/10/2011 at 10:38 AM

## 2011-02-10 NOTE — Progress Notes (Signed)
10:00 AM ADMITTED TO [facility] FOR SURGERY. PROCEDURE REVIEWED WITH PT.

## 2011-02-10 NOTE — Brief Op Note (Signed)
Brief Postoperative Note    Theresa Vargas  Date of Birth:  1955/03/25  [id number]    Pre-operative Diagnosis: anal tag    Post-operative Diagnosis: Same    Procedure:excision anal tag    Anesthesia: local 3 ml 1% lidocaine with epinephrine    Surgeons/Assistants: Noa Constante    Estimated Blood Loss: Minimal    Complications: none    Specimens: were obtained  Discarded per pt request      Margret Chance, MD 02/10/2011  10:36 AM

## 2011-02-10 NOTE — Op Note (Signed)
[facility]                                    [place], [place]                                   RECORD OF OPERATION     PATIENT NAME: Theresa Vargas, Theresa Vargas.              DOB: [birth year]  MEDICAL RECORD NO. [id number]                 ROOM: ON  ACCOUNT NO: [id number]                          DATE: 02/10/2011  SURGEON: Margret Chance, M.D.        DATE:  02/10/2011.     PRE-PROCEDURE DIAGNOSIS:  Anal tags/fiber epithelial polyp.     POST-PROCEDURE DIAGNOSIS:  Anal tags/fiber epithelial polyp.     PROCEDURE:  Excision of anal fibroepithelial polyp.     ANESTHESIA:  1 percent lidocaine with epinephrine.     ESTIMATED BLOOD LOSS:  5 ml.     INDICATIONS:  The patient is a 56 year old female who has had prior  hemorrhoidal banding and then subsequent, surgical hemorrhoidectomy.  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  She desires  excision.  She desired to proceed to do this in outpatient nursing.     PROCEDURE:  In the outpatient nursing unit, the patient was placed in the  left lateral decubitus position.  The perianal area was prepped with  Betadine.  The base of the polyp was infiltrated locally with 1 percent  lidocaine with epinephrine.  She did still experience some discomfort.  This  was amputated at its base using a vasector cautery device.  There was a small  amount of bleeding at the stump.  This was additionally cauterized with  adequate hemostasis.  The patient tolerated the procedure well.  Call the  office for any problems.  Otherwise, follow up in the office in 3 months.  She was given a prescription for Vicodin 5/325 mg, 1 every 6 hours as needed  for pain.           Margret Chance, M.D.        D: 02/10/2011 10:39                                    T: 02/10/2011 14:18  bw     CC:  Margret Chance, M.D.  [address]  [address]  [place], [place] [address]

## 2011-02-10 NOTE — Progress Notes (Signed)
Dr. Neoma Laming here.     Site cleansed with betadine.     1030: Anal tag removed using cautery. Pressure applied to site for several minutes.    1035: No bleeding noted. Pain level # 3 @ present time.    1040:Pt discharged with instructions.  Pt discharged ambulatory in stable condition.

## 2011-02-23 LAB — GLOMERULAR FILTRATION RATE, ESTIMATED: Est, Glom Filt Rate: 75 mL/min/{1.73_m2} — AB

## 2011-02-23 LAB — LIPID PANEL
Cholesterol, Total: 200 mg/dl — ABNORMAL HIGH (ref 100–199)
HDL: 41 mg/dl
LDL Calculated: 117 mg/dl
Triglycerides: 208 mg/dl — ABNORMAL HIGH (ref 0–199)

## 2011-02-23 LAB — BUN: BUN: 10 mg/dl (ref 7–22)

## 2011-02-23 LAB — ELECTROLYTE PANEL
CO2: 30 meq/l (ref 23–33)
Chloride: 103 meq/l (ref 98–111)
Potassium: 4 meq/l (ref 3.5–5.2)
Sodium: 139 meq/l (ref 135–145)

## 2011-02-23 LAB — CREATININE: Creatinine: 0.8 mg/dl (ref 0.4–1.2)

## 2011-02-23 LAB — HEMOGLOBIN A1C
AVERAGE GLUCOSE: 126 mg/dl (ref 70–126)
Hemoglobin A1C: 6.2 % (ref 4.4–6.4)

## 2011-02-23 LAB — GLUCOSE, RANDOM: Glucose: 115 mg/dl — ABNORMAL HIGH (ref 70–108)

## 2011-02-23 LAB — ALT/SGPT ALANINE TRANSAMINASE: ALT: 26 U/L (ref 11–66)

## 2011-02-23 MED ORDER — MOMETASONE FUROATE 50 MCG/ACT NA SUSP
50 MCG/ACT | Freq: Two times a day (BID) | NASAL | Status: DC
Start: 2011-02-23 — End: 2013-07-19

## 2011-02-23 NOTE — Progress Notes (Signed)
Subjective:      Patient ID: Theresa Vargas is a 56 y.o. female.    HPI  dizzness HPI SP LMAT 07-2009 (T-tube) by Dr Randell Loop    Dizziness: reports dizziness has resolved.   Tinnitus: reports bilateral "swooshing' sounds in both ears worse in the mornings and during winter. Reports had an acute URI in late December has had a round on antibiotics. denies fever. Denies headaches and dizziness. Reports increased nasal congestion and post nasal drainage and "green'rhintis resolved now. Denies ear drainage or ear infections sp LMAT.  Sinusitis: Patient presents with acute on chronic sinusitis. The patient reports chronic sinus infections for 2 months. Patient reports2 infections in the last 2 months reports took left over antibiotics on her own and rx from pcp. Patient reports onset insidious/ after respiratory symptoms. Patient denies nasal trauma. Her symptoms include nasal congestion, purulent rhinorrhea, cough, sneezing, sniffing, sore throats, frequent clearing of the throat, facial pain.  There has not been a history of fevers, epistaxis. There has been a history of chronic otitis media or pharyngotonsillitis.  Prior antibiotic therapy has included multiple antibiotics. Other medications have included nothing additional. Shehas not utilized nasal sinus rinse.  She has had allergy testing which was positive - cats.         Patient Active Problem List   Diagnosis   . OM (otitis media)   . ETD (eustachian tube dysfunction)   . Dizziness and giddiness   . Anal skin tag   . Tinnitus of both ears   . Chronic sinusitis   . Acute URI       Past Medical History   Diagnosis Date   . Hypertension    . Psychiatric problem    . Nausea & vomiting         Past Surgical History   Procedure Laterality Date   . Cholecystectomy  2007   . Hysterectomy  2007   . [facility]ck [facility]  [facility]     [facility]   . Tympanostomy tube placement  [id number]   . Skin biopsy  2007     left cheek   . Colonoscopy       2006   . Hemorrhoid surgery   05/2010   . Other surgical history  11/05/10     hemmroidectomy       Theresa Vargas had no medications administered during this visit.    Allergies   Allergen Reactions   . Anesthetic Ether Other (See Comments)     Reaction to Local and general       Family History   Problem Relation Age of Onset   . Diabetes Other    . Hypertension Other    . Mental Illness Other    . Heart Disease Other    . Other Other      bone and joint problems       History     Social History   . Marital Status: Married     Spouse Name: N/A     Number of Children: N/A   . Years of Education: N/A     Occupational History   . Not on file.     Social History Main Topics   . Smoking status: Never Smoker    . Smokeless tobacco: Not on file   . Alcohol Use: No   . Drug Use: No   . Sexually Active: Not on file     Other Topics Concern   . Not  on file     Social History Narrative   . No narrative on file       Review of Systems   Constitutional: Negative for fever, chills, diaphoresis, activity change, appetite change, fatigue and unexpected weight change.   HENT: Positive for tinnitus. Negative for hearing loss, ear pain, nosebleeds, congestion, sore throat, facial swelling, rhinorrhea, sneezing, drooling, mouth sores, trouble swallowing, neck pain, neck stiffness, dental problem, voice change, postnasal drip, sinus pressure and ear discharge.    Eyes: Negative for photophobia, pain, discharge, redness, itching and visual disturbance.   Respiratory: Negative for apnea, cough, choking, chest tightness, shortness of breath, wheezing and stridor.    Cardiovascular: Negative for chest pain, palpitations and leg swelling.   Gastrointestinal: Negative for nausea, vomiting, abdominal pain, diarrhea, constipation, blood in stool, abdominal distention, anal bleeding and rectal pain.   Endocrine: Negative for cold intolerance, heat intolerance, polydipsia, polyphagia and polyuria.   Genitourinary: Negative for dysuria, urgency, frequency, hematuria, flank pain,  decreased urine volume, vaginal bleeding, vaginal discharge, enuresis, difficulty urinating, genital sores, vaginal pain, menstrual problem, pelvic pain and dyspareunia.   Musculoskeletal: Negative for myalgias, back pain, joint swelling, arthralgias and gait problem.   Skin: Negative for color change, pallor, rash and wound.   Allergic/Immunologic: Negative for environmental allergies, food allergies and immunocompromised state.   Neurological: Negative for dizziness, tremors, seizures, syncope, facial asymmetry, speech difficulty, weakness, light-headedness, numbness and headaches.   Hematological: Negative for adenopathy. Does not bruise/bleed easily.   Psychiatric/Behavioral: Negative for suicidal ideas, hallucinations, behavioral problems, confusion, disturbed wake/sleep cycle, self-injury, dysphoric mood, decreased concentration and agitation. The patient is nervous/anxious. The patient is not hyperactive.        Objective:   Physical Exam  Blood pressure 130/84, pulse 84, temperature 97.9 F (36.6 C), temperature source Oral, resp. rate 16, height 5\' 6"  (1.676 m), weight 180 lb (81.647 kg).    General appearance - alert, well appearing, and in no distress, oriented to person, place, and time and normal appearing weight  Mental status - alert, oriented to person, place, and time, normal mood, behavior, speech, dress, motor activity, and thought processes  Eyes - pupils equal and reactive, extraocular eye movements intact  Ears - normal pinne and eac. Right:translucent, left; pet intact and appears aerated.   Nose - mucosal congestion, purulent rhinorrhea and septal deviation to left  Mouth - mucous membranes moist, pharynx normal without lesions  Neck - supple, no significant adenopathy  Chest - clear to auscultation, no wheezes, rales or rhonchi, symmetric air entry  Heart - normal rate, regular rhythm, normal S1, S2, no murmurs, rubs, clicks or gallops  Neurological - alert, oriented, normal speech, no  focal findings or movement disorder noted    Assessment:      1. OM (otitis media)  Audiometry with tympanometry   2. ETD (eustachian tube dysfunction)  Audiometry with tympanometry   3. Dizziness and giddiness     4. Tinnitus of both ears  Audiometry with tympanometry   5. Chronic sinusitis  mometasone (NASONEX) 50 MCG/ACT nasal spray   6. Acute URI  mometasone (NASONEX) 50 MCG/ACT nasal spray           Plan:      I have discussed all treatment options.  Use nasal sinus rinse twice a day morning and night. Mix packets with distilled water. Warm water to a little warmer than luke warm . Spray half up the right nostril keeping your chin to your chest, then  rinse the left nostril with remaining. Wait 15 minutes use nasal steroid spray. Nasonex: One spray each nostril twice a day for 30 days. Use opposite hand to side of nose to instill spray.    Orders Placed This Encounter   Procedures   . Audiometry with tympanometry     Orders Placed This Encounter   Medications   . mometasone (NASONEX) 50 MCG/ACT nasal spray     Sig: 1 spray by Nasal route 2 times daily for 30 days.     Dispense:  1 Inhaler     Refill:  3     Sample given of nasonex.   I will follow up with the patient in the office in 3 months

## 2011-02-25 NOTE — Progress Notes (Signed)
Chart reviewed and agree with plan of care.

## 2011-05-25 NOTE — Progress Notes (Signed)
Subjective:      Patient ID: Theresa Vargas is a 56 y.o. female.  "audio done on same day."    HPI  Theresa Vargas is a very pleasant 56 year old caucasian female who was reviewed back at this office today regarding her ear problems.    She is status post placement of a left PET (T tube) and endoscopic biopsy of the nasopharynx on 07/30/2009. Pathological exam suggested only benign findings on the biopsy specimen.     Audio today suggested her hearing thresholds are within normal limits, SDS scores normal at 100% bilaterally, and tympanograms of type A on the right side and no seal on the left side.     Today she reported no further ear problems.  She also denied any nasal or throat symptoms at this time.    Patient Active Problem List   Diagnosis   . OM (otitis media)   . ETD (eustachian tube dysfunction)   . Dizziness and giddiness   . Anal skin tag   . Tinnitus of both ears   . Chronic sinusitis   . Acute URI     Past Medical History   Diagnosis Date   . Hypertension    . Psychiatric problem    . Nausea & vomiting       Past Surgical History   Procedure Laterality Date   . Cholecystectomy  2007   . Hysterectomy  2007   . Neck surgery  2008 [facility]     [facility]DF   . Tympanostomy tube placement  [id number]   . Skin biopsy  2007     left cheek   . Colonoscopy       2006   . Hemorrhoid surgery  05/2010   . Other surgical history  11/05/10     hemmroidectomy     Theresa Vargas had no medications administered during this visit.    Allergies   Allergen Reactions   . Anesthetic Ether Other (See Comments)     Reaction to Local and general     Family History   Problem Relation Age of Onset   . Diabetes Other    . Hypertension Other    . Mental Illness Other    . Heart Disease Other    . Other Other      bone and joint problems     History     Social History   . Marital Status: Married     Spouse Name: N/A     Number of Children: N/A   . Years of Education: N/A     Occupational History   . Not on file.     Social History Main  Topics   . Smoking status: Never Smoker    . Smokeless tobacco: Not on file   . Alcohol Use: No   . Drug Use: No   . Sexually Active: Not on file     Other Topics Concern   . Not on file     Social History Narrative   . No narrative on file     Review of Systems   Constitutional: Negative for fever, chills, diaphoresis, activity change, appetite change, fatigue and unexpected weight change.   HENT: Negative for hearing loss, ear pain, nosebleeds, congestion, sore throat, facial swelling, rhinorrhea, sneezing, drooling, mouth sores, trouble swallowing, neck pain, neck stiffness, dental problem, voice change, postnasal drip, sinus pressure, tinnitus and ear discharge.    Eyes: Negative for pain, discharge, redness, itching and  visual disturbance.   Respiratory: Negative for apnea, cough, choking, chest tightness, shortness of breath, wheezing and stridor.    Cardiovascular: Negative for chest pain, palpitations and leg swelling.   Gastrointestinal: Negative for nausea, vomiting, abdominal pain, diarrhea, constipation, blood in stool, abdominal distention, anal bleeding and rectal pain.   Endocrine: Negative for cold intolerance, heat intolerance, polydipsia, polyphagia and polyuria.   Genitourinary: Negative for dysuria, urgency, frequency, hematuria, flank pain, decreased urine volume, vaginal bleeding, vaginal discharge, enuresis, difficulty urinating, genital sores, vaginal pain, menstrual problem and pelvic pain.   Musculoskeletal: Negative for myalgias, back pain, joint swelling, arthralgias and gait problem.   Skin: Negative for color change, pallor, rash and wound.   Allergic/Immunologic: Negative for environmental allergies, food allergies and immunocompromised state.   Neurological: Negative for dizziness, tremors, seizures, syncope, facial asymmetry, speech difficulty, weakness, light-headedness, numbness and headaches.   Hematological: Negative for adenopathy. Does not bruise/bleed easily.    Psychiatric/Behavioral: Negative.  Negative for suicidal ideas, hallucinations, behavioral problems, confusion, disturbed wake/sleep cycle, self-injury, dysphoric mood, decreased concentration and agitation. The patient is not nervous/anxious and is not hyperactive.      Objective:   Physical Exam    Blood pressure 120/100, pulse 64, temperature 98.4 F (36.9 C), temperature source Oral, resp. rate 20, height 5\' 6"  (1.676 m), weight 178 lb 4.8 oz (80.876 kg).    General appearance - alert, well appearing, and in no distress  Mental status - alert, oriented to person, place, and time  Eyes - pupils equal and reactive, extraocular eye movements intact  Ears - The left PE tube was in place and functioning.  Both the tympanic membranes appeared normal without any acute findings.  The ear canals also appeared normal.  Nose - Nasal septal deviation to the right, hypertrophic inferior turbinates  Mouth - mucous membranes moist, pharynx normal without lesions  Neck - supple, no significant adenopathy  Chest - deferred  Heart - deferred  Neurological - alert, oriented, normal speech, no focal findings or movement disorder noted    Assessment:     1. Unspecified otitis media    2. Eustachian tube dysfunction    3. Status post myringotomy with insertion of tube    4. Nasal septal deviation    5. Hypertrophy of nasal turbinates       Plan:     I have discussed all treatment options.    I have reassured her that the left PE tube is still in place and functioning and she has to continue following the water precautions for this ear.    We will follow up with the patient in the office in 11 months time.

## 2011-05-26 NOTE — Communication Body (Signed)
Subjective:      Patient ID: Theresa Vargas is a 56 y.o. female.  "audio done on same day."    HPI  Theresa Vargas is a very pleasant 56 year old caucasian female who was reviewed back at this office today regarding her ear problems.    She is status post placement of a left PET (T tube) and endoscopic biopsy of the nasopharynx on 07/30/2009. Pathological exam suggested only benign findings on the biopsy specimen.     Audio today suggested her hearing thresholds are within normal limits, SDS scores normal at 100% bilaterally, and tympanograms of type A on the right side and no seal on the left side.     Today she reported no further ear problems.  She also denied any nasal or throat symptoms at this time.    Patient Active Problem List   Diagnosis   . OM (otitis media)   . ETD (eustachian tube dysfunction)   . Dizziness and giddiness   . Anal skin tag   . Tinnitus of both ears   . Chronic sinusitis   . Acute URI     Past Medical History   Diagnosis Date   . Hypertension    . Psychiatric problem    . Nausea & vomiting       Past Surgical History   Procedure Laterality Date   . Cholecystectomy  2007   . Hysterectomy  2007   . Neck surgery  2008 [facility]     ACDF   . Tympanostomy tube placement  [id number]   . Skin biopsy  2007     left cheek   . Colonoscopy       2006   . Hemorrhoid surgery  05/2010   . Other surgical history  11/05/10     hemmroidectomy     Theresa Vargas had no medications administered during this visit.    Allergies   Allergen Reactions   . Anesthetic Ether Other (See Comments)     Reaction to Local and general     Family History   Problem Relation Age of Onset   . Diabetes Other    . Hypertension Other    . Mental Illness Other    . Heart Disease Other    . Other Other      bone and joint problems     History     Social History   . Marital Status: Married     Spouse Name: N/A     Number of Children: N/A   . Years of Education: N/A     Occupational History   . Not on file.     Social History Main  Topics   . Smoking status: Never Smoker    . Smokeless tobacco: Not on file   . Alcohol Use: No   . Drug Use: No   . Sexually Active: Not on file     Other Topics Concern   . Not on file     Social History Narrative   . No narrative on file     Review of Systems   Constitutional: Negative for fever, chills, diaphoresis, activity change, appetite change, fatigue and unexpected weight change.   HENT: Negative for hearing loss, ear pain, nosebleeds, congestion, sore throat, facial swelling, rhinorrhea, sneezing, drooling, mouth sores, trouble swallowing, neck pain, neck stiffness, dental problem, voice change, postnasal drip, sinus pressure, tinnitus and ear discharge.    Eyes: Negative for pain, discharge, redness, itching and  visual disturbance.   Respiratory: Negative for apnea, cough, choking, chest tightness, shortness of breath, wheezing and stridor.    Cardiovascular: Negative for chest pain, palpitations and leg swelling.   Gastrointestinal: Negative for nausea, vomiting, abdominal pain, diarrhea, constipation, blood in stool, abdominal distention, anal bleeding and rectal pain.   Endocrine: Negative for cold intolerance, heat intolerance, polydipsia, polyphagia and polyuria.   Genitourinary: Negative for dysuria, urgency, frequency, hematuria, flank pain, decreased urine volume, vaginal bleeding, vaginal discharge, enuresis, difficulty urinating, genital sores, vaginal pain, menstrual problem and pelvic pain.   Musculoskeletal: Negative for myalgias, back pain, joint swelling, arthralgias and gait problem.   Skin: Negative for color change, pallor, rash and wound.   Allergic/Immunologic: Negative for environmental allergies, food allergies and immunocompromised state.   Neurological: Negative for dizziness, tremors, seizures, syncope, facial asymmetry, speech difficulty, weakness, light-headedness, numbness and headaches.   Hematological: Negative for adenopathy. Does not bruise/bleed easily.    Psychiatric/Behavioral: Negative.  Negative for suicidal ideas, hallucinations, behavioral problems, confusion, disturbed wake/sleep cycle, self-injury, dysphoric mood, decreased concentration and agitation. The patient is not nervous/anxious and is not hyperactive.      Objective:   Physical Exam    Blood pressure 120/100, pulse 64, temperature 98.4 F (36.9 C), temperature source Oral, resp. rate 20, height 5\' 6"  (1.676 m), weight 178 lb 4.8 oz (80.876 kg).    General appearance - alert, well appearing, and in no distress  Mental status - alert, oriented to person, place, and time  Eyes - pupils equal and reactive, extraocular eye movements intact  Ears - The left PE tube was in place and functioning.  Both the tympanic membranes appeared normal without any acute findings.  The ear canals also appeared normal.  Nose - Nasal septal deviation to the right, hypertrophic inferior turbinates  Mouth - mucous membranes moist, pharynx normal without lesions  Neck - supple, no significant adenopathy  Chest - deferred  Heart - deferred  Neurological - alert, oriented, normal speech, no focal findings or movement disorder noted    Assessment:     1. Unspecified otitis media    2. Eustachian tube dysfunction    3. Status post myringotomy with insertion of tube    4. Nasal septal deviation    5. Hypertrophy of nasal turbinates       Plan:     I have discussed all treatment options.    I have reassured her that the left PE tube is still in place and functioning and she has to continue following the water precautions for this ear.    We will follow up with the patient in the office in 11 months time.

## 2011-09-02 LAB — CBC
Hematocrit: 41.8 % (ref 37.0–47.0)
Hemoglobin: 13.6 gm/dl (ref 12.0–16.0)
MCH: 28.8 pg (ref 27.0–31.0)
MCHC: 32.5 gm/dl — ABNORMAL LOW (ref 33.0–37.0)
MCV: 88.5 fL (ref 81.0–99.0)
MPV: 7 mcm — ABNORMAL LOW (ref 7.4–10.4)
Platelets: 299 10*3/uL (ref 130–400)
RBC: 4.72 10*6/uL (ref 4.20–5.40)
RDW: 13.1 % (ref 11.5–14.5)
WBC: 5.5 10*3/uL (ref 4.8–10.8)

## 2011-09-02 LAB — LIPID PANEL
Cholesterol, Total: 189 mg/dl (ref 100–199)
HDL: 43 mg/dl
LDL Calculated: 115 mg/dl
Triglycerides: 156 mg/dl (ref 0–199)

## 2011-09-02 LAB — BUN: BUN: 12 mg/dl (ref 7–22)

## 2011-09-02 LAB — TSH: TSH: 1.74 mcIU/ml (ref 0.400–4.200)

## 2011-09-02 LAB — ALT: ALT: 13 U/L (ref 11–66)

## 2011-09-02 LAB — ELECTROLYTE PANEL
CO2: 30 meq/l (ref 23–33)
Chloride: 108 meq/l (ref 98–111)
Potassium: 4.3 meq/l (ref 3.5–5.2)
Sodium: 141 meq/l (ref 135–145)

## 2011-09-02 LAB — GLUCOSE, RANDOM: Glucose: 123 mg/dl — ABNORMAL HIGH (ref 70–108)

## 2011-09-02 LAB — CREATININE: Creatinine: 0.9 mg/dl (ref 0.4–1.2)

## 2011-09-02 LAB — GLOMERULAR FILTRATION RATE, ESTIMATED: Est, Glom Filt Rate: 65 mL/min/{1.73_m2} — AB

## 2012-01-24 ENCOUNTER — Inpatient Hospital Stay: Admit: 2012-01-24 | Discharge: 2012-01-24 | Disposition: A | Discharge status: Home / Self Care

## 2012-01-24 MED ORDER — IBUPROFEN 800 MG PO TABS
800 MG | ORAL_TABLET | Freq: Three times a day (TID) | ORAL | Status: DC | PRN
Start: 2012-01-24 — End: 2013-07-19

## 2012-01-24 MED ORDER — HYDROCODONE-ACETAMINOPHEN 5-325 MG PO TABS
5-325 MG | ORAL_TABLET | Freq: Four times a day (QID) | ORAL | Status: AC | PRN
Start: 2012-01-24 — End: 2012-01-27

## 2012-01-24 NOTE — ED Notes (Signed)
Appt. Made with [facility] for Thursday at 12:10    Collene Mares, RN  01/24/12 1358

## 2012-01-24 NOTE — ED Provider Notes (Signed)
Patient is a 56 y.o. female presenting with foot injury. The history is provided by the patient.   Foot Injury   The incident occurred more than 1 week ago. The incident occurred at home. The injury mechanism was a direct blow. The pain is present in the left foot and left toes. The quality of the pain is described as aching and throbbing. The pain is at a severity of 10/10. The pain is severe. The pain has been intermittent since onset. Pertinent negatives include no numbness, no inability to bear weight, no loss of motion, no muscle weakness, no loss of sensation and no tingling. She reports no foreign bodies present. The symptoms are aggravated by activity, bearing weight and palpation. She has tried nothing for the symptoms.    C/o left lateral/midfoot pain and left 3rd digit pain.  Pt stubbed her toe 1 week ago.  States pain is no better.  Denies weakness or numbness/tingling.  Past Medical History   Diagnosis Date   ??? Hypertension    ??? Psychiatric problem    ??? Nausea & vomiting      Past Surgical History   Procedure Laterality Date   ??? Cholecystectomy  2007   ??? Hysterectomy  2007   ??? [facility]ck surgery  [facility]     [facility]DF   ??? Tympanostomy tube placement  [id number]   ??? Skin biopsy  2007     left cheek   ??? Colonoscopy       2006   ??? Hemorrhoid surgery  05/2010   ??? Other surgical history  11/05/10     hemmroidectomy     Family History   Problem Relation Age of Onset   ??? Diabetes Other    ??? Hypertension Other    ??? Mental Illness Other    ??? Heart Disease Other    ??? Other Other      bone and joint problems     BP 127/81   Pulse 90   Temp(Src) 97.5 ??F (36.4 ??C)   Resp 16   Ht 5\' 6"  (1.676 m)   Wt 178 lb (80.74 kg)   BMI 28.74 kg/m2   SpO2 97%      Review of Systems   Musculoskeletal: Positive for arthralgias. Negative for joint swelling and gait problem.   Neurological: Negative for tingling, weakness and numbness.   All other systems reviewed and are negative.        Physical Exam   Nursing note and vitals  reviewed.  Constitutional: She is oriented to person, place, and time. Vital signs are normal. She appears well-developed and well-nourished.  Non-toxic appearance. She does not have a sickly appearance. She does not appear ill. No distress.   HENT:   Head: Normocephalic and atraumatic.   Cardiovascular: Intact distal pulses.    Pulses:       Dorsalis pedis pulses are 2+ on the left side.        Posterior tibial pulses are 2+ on the left side.   Pulmonary/Chest: Effort normal. No respiratory distress.   Musculoskeletal: Normal range of motion. She exhibits tenderness.        Left foot: She exhibits tenderness and bony tenderness. She exhibits normal range of motion, no swelling, normal capillary refill and no deformity.        Feet:    Neurological: She is alert and oriented to person, place, and time.   Skin: Skin is warm and dry. She is not diaphoretic.  Procedures    MDM  Number of Diagnoses or Management Options  Toe fracture: minor     Amount and/or Complexity of Data Reviewed  Tests in the radiology section of CPT??: ordered and reviewed    Risk of Complications, Morbidity, and/or Mortality  Presenting problems: low  Diagnostic procedures: low  Management options: low  General comments: Fx proximal phalanx of 3rd digit.  Buddy tape/post op shoe.  Pt has own crutches.  To f/u with [facility] on 12/26.        Keep follow up for 01/27/12 at 1210.  Arrive 15 minutes early.  Meds as prescribed.  Activity as tolerated.  If worse go to ER.  Problem List Items Addressed This Visit    None      Visit Diagnoses    Toe fracture    -  Primary     Relevant Medications        ibuprofen (MOTRIN) tablet        HYDROcodone-acetaminophen (NORCO) 5-325 MG per tablet     Other Relevant Orders        XR Foot Left Standard        Post-op shoe        Buddy tape (specify site)           Labs      Radiology    Left foot: 3rd prox phalanx fx read by Dr. Hyacinth Meeker at 1334    EKG Interpretation.        Estill Dooms, NP  01/24/12 [phone]

## 2012-01-24 NOTE — Discharge Instructions (Signed)
Hard-Soled Shoe Use  This is a flat, soft shoe with a hard (sometimes wood) sole. It is used for toe fractures and certain foot surgeries. Your doctor will tell you how much weight to put on your foot.  HOME CARE INSTRUCTIONS    Lace the shoe to make it secure and comfortable. You do not want to feel pressure or rubbing on the painful area.   Follow instructions for wear as directed by your caregiver.  Document Released: 10/24/2003 Document Revised: 04/12/2011 Document Reviewed: 01/18/2005  [facility] Patient Information 2013 [place].    Toe Fracture  You have a broken toe. Unless you have broken the big toe, these injuries usually heal in 2 to 3 weeks. The fifth toe (little toe) is the toe that is broken most often. Pain and swelling are common, and sometimes there is a deformity of the toe. Treatment includes reducing deformity, followed by rest, elevation, ice, mild pain medicine, and taping the toe. For the next 2 to 3 days stay off your feet as much as possible and apply ice packs to the injury for 20 to 30 minutes every 2 to 3 hours. Pain with weight bearing often is present until the fracture heals.   When you start walking again, use a shoe with a firm sole and plenty of room to keep the pressure off the broken toe. You can immobilize a toe fracture by taping it to the toe next to it. Place a small cotton ball between the toes to reduce skin rubbing and irritation when you apply the tape. Use paper tape if you are sensitive to adhesive tape. Change the tape every 2 days or more often if it gets wet. For fractures of the big toe, you may need crutches for several weeks and a better way to immobilize your toe such as a splint, cast or fracture boot. The reason for this is that the big toe bears most of your weight when you walk. Follow up with your caregiver as recommended to ensure that your injury is healing.   SEEK MEDICAL CARE IF:   You have increased pain, swelling, redness, or deformity of your  toe.  MAKE SURE YOU:    Understand these instructions.   Will watch your condition.   Will get help right away if you are not doing well or get worse.  Document Released: 02/26/2004 Document Revised: 04/12/2011 Document Reviewed: 02/21/2008  [facility] Patient Information 2013 [place].

## 2012-03-31 NOTE — Telephone Encounter (Signed)
Theresa Vargas would like to cancel the following appointments:     Tommie ArdSusan Lesinski, CNP in [facility] ([phone]), 04/24/2012  2:00 PM       Comments: please cancel my appointment due to dr. Randell Loopvanan leaving the      practice. thank you. [name]     APPT HAS BEEN CANCELLED -ba*

## 2012-10-12 MED ORDER — METHYLPREDNISOLONE ACETATE 80 MG/ML IJ SUSP
80 MG/ML | INTRAMUSCULAR | Status: AC
Start: 2012-10-12 — End: ?

## 2012-10-12 MED ORDER — BUPIVACAINE HCL (PF) 0.25 % IJ SOLN
0.25 % | Freq: Once | INTRAMUSCULAR | Status: AC
Start: 2012-10-12 — End: 2012-10-12
  Administered 2012-10-12: 17:00:00 via EPIDURAL

## 2012-10-12 MED ORDER — IOHEXOL 180 MG/ML IJ SOLN
180 MG/ML | Freq: Once | INTRAMUSCULAR | Status: AC | PRN
Start: 2012-10-12 — End: 2012-10-12
  Administered 2012-10-12: 17:00:00

## 2012-10-12 MED ORDER — METHYLPREDNISOLONE ACETATE 80 MG/ML IJ SUSP
80 MG/ML | Freq: Once | INTRAMUSCULAR | Status: AC
Start: 2012-10-12 — End: 2012-10-12
  Administered 2012-10-12: 17:00:00 via EPIDURAL

## 2012-10-12 MED ORDER — BUPIVACAINE HCL (PF) 0.25 % IJ SOLN
0.25 % | INTRAMUSCULAR | Status: AC
Start: 2012-10-12 — End: ?

## 2012-10-12 MED ORDER — LIDOCAINE HCL 2 % IJ SOLN
2 % | INTRAMUSCULAR | Status: AC
Start: 2012-10-12 — End: ?

## 2012-10-12 MED FILL — LIDOCAINE HCL 2 % IJ SOLN: 2 % | INTRAMUSCULAR | Qty: 20

## 2012-10-12 MED FILL — SENSORCAINE-MPF 0.25 % IJ SOLN: 0.25 % | INTRAMUSCULAR | Qty: 30

## 2012-10-12 MED FILL — DEPO-MEDROL 80 MG/ML IJ SUSP: 80 MG/ML | INTRAMUSCULAR | Qty: 1

## 2012-10-12 NOTE — Plan of Care (Signed)
_M___ Safety:       (Environmental)  ??? Orient to environment  ??? Ensure ID band is correct and in place/ allergy band as needed  ??? Assess for fall risk  ??? Initiate fall precautions as applicable (fall band, side rails, etc.)  ??? Call light within reach  ??? Bed in low position/ wheels locked    _M___ Pain:       ??? Assess pain level and characteristics  ??? Administer analgesics as ordered  ??? Assess effectiveness of pain management and report to MD as needed    _M___ Knowledge Deficit:  ??? Assess baseline knowledge  ??? Provide teaching at level of understanding  ??? Provide teaching via preferred learning method  ??? Evaluate teaching effectiveness    __M__ Hemodynamic/Respiratory Status:       (Pre and Post Procedure Monitoring)  ??? Assess/Monitor vital signs  ??? Obtain weight/height  ???   ??? Monitor procedure site and notify MD of any issues    ???   ???

## 2012-10-12 NOTE — Progress Notes (Signed)
Pt here for epidural . Procedure explained to patient and husband    1300 : To radiology

## 2012-10-12 NOTE — Discharge Instructions (Signed)
NERVE BLOCK DISCHARGE INSTRUCTION    1.  Take it easy and rest the remainder of the day.  2.  Do not drive for the remainder of the day.  3.  You may use ice on the area of the injection to help alleviate discomfort.  Avoid heat for the remainder of the day.  4.  Do not soak in water or use a tub bath or hot tub for the remainder of the day.  5.  You may resume normal eating and drinking.  6. This evening you may resume your pain medications and any other medications you had stopped prior to the injection.  7.  Resume activities starting tomorrow in gradual manner.  Avoid activities with a lot of bending and twisting (golf, tennis, etc.) for at least a week.  You may resume physical therapy.  8.  Schedule an appointment to have another injection if this is the first or second of the series of three injections ordered.  Cancel 48 hrs prior the scheduled appointment if you are pain free.  Call [phone].  9.  Date:  Thursday Oct 16   Time:  145 pm           Be here at: 1 pm   Bring someone to drive you home.   Nothing to eat or drink 2 hrs prior.   No blood thinners or aspirin products 5 days prior.  10.  Notify Radiology [phone]), or go to the emergency room immediately if you develop any of the following symptoms: fever, chills, changes in mental status, severe pain, difficulty breathing, prolonged, severe headache, numbness or weakness in your arm or legs, loss of control of your bladder or bowel, excessive redness, swelling, or drainage from the area of injection.    ____________________________________________  Signature of patient, guardian, or other responsible party    Theresa MainlandCharlotte R Cypress Fanfan 10/12/2012 2:11 PM

## 2012-10-12 NOTE — Progress Notes (Signed)
Pt returned to floor. Band aid  Dry and intact to low back. Pt complaining of some numbness in left hip and thigh.    1430: Pt up in room. Gait steady. No pain or numbness @ present. Discharge instructions given. Pt verbalizes understanding. Pt discharged in stable condition. Pt taken to car per wheelchair.

## 2012-10-12 NOTE — Progress Notes (Signed)
1300 Pt arrived to special procedure room 1 for lumbar epidural.   1302 Procedure explained using teach back method. Pt states understanding.  1303 Dr Consepcion HearingPunukollu into assess patient and explain procedure.  1305 This procedure has been fully reviewed with the patient and written informed consent has been obtained.   1310 Pt assisted to table in prone position. Comfort ensured.  1324 Pre-procedure images obtained.  1327 Procedure begins.  1330 Procedure complete.   1332 Pt assisted to cart in supine position. Comfort ensured.  1336 Pt transported to OPN in stable condition.

## 2012-11-17 MED ORDER — BUPIVACAINE HCL (PF) 0.25 % IJ SOLN
0.25 % | Freq: Once | INTRAMUSCULAR | Status: AC
Start: 2012-11-17 — End: 2012-11-17
  Administered 2012-11-17: 18:00:00 via EPIDURAL

## 2012-11-17 MED ORDER — LIDOCAINE HCL 2 % IJ SOLN
2 % | INTRAMUSCULAR | Status: AC
Start: 2012-11-17 — End: ?

## 2012-11-17 MED ORDER — IOHEXOL 180 MG/ML IJ SOLN
180 MG/ML | Freq: Once | INTRAMUSCULAR | Status: AC | PRN
Start: 2012-11-17 — End: 2012-11-17
  Administered 2012-11-17: 18:00:00

## 2012-11-17 MED ORDER — METHYLPREDNISOLONE ACETATE 80 MG/ML IJ SUSP
80 MG/ML | Freq: Once | INTRAMUSCULAR | Status: AC
Start: 2012-11-17 — End: 2012-11-17
  Administered 2012-11-17: 18:00:00 via EPIDURAL

## 2012-11-17 MED ORDER — METHYLPREDNISOLONE ACETATE 80 MG/ML IJ SUSP
80 MG/ML | INTRAMUSCULAR | Status: AC
Start: 2012-11-17 — End: ?

## 2012-11-17 MED ORDER — BUPIVACAINE HCL (PF) 0.25 % IJ SOLN
0.25 % | INTRAMUSCULAR | Status: AC
Start: 2012-11-17 — End: ?

## 2012-11-17 MED FILL — DEPO-MEDROL 80 MG/ML IJ SUSP: 80 MG/ML | INTRAMUSCULAR | Qty: 1

## 2012-11-17 MED FILL — LIDOCAINE HCL 2 % IJ SOLN: 2 % | INTRAMUSCULAR | Qty: 20

## 2012-11-17 MED FILL — SENSORCAINE-MPF 0.25 % IJ SOLN: 0.25 % | INTRAMUSCULAR | Qty: 30

## 2012-11-17 NOTE — Progress Notes (Signed)
Department of Radiology  Post Procedure Progress Note      Pre-Procedure Diagnosis:  Pain   Post-Procedure Diagnosis:  Same    Procedure Performed:  EPIDURAL BLOCK at the left L3/4 level    Physician and Assistants:  Derrill Kay. Wandalee Klang, MD    Anesthesia: Lidocaine    Findings: As per dictated report    Complications:  None.    Estimated Blood Loss: Negligble.    See dictated procedure report for full details.    Reyce Lubeck K. Brigette Hopfer 11/17/2012 2:24 PM

## 2012-11-17 NOTE — Plan of Care (Signed)
M____ Safety:       (Environmental)  ??? Orient to environment  ??? Ensure ID band is correct and in place/ allergy band as needed  ??? Assess for fall risk  ??? Initiate fall precautions as applicable (fall band, side rails, etc.)  ??? Call light within reach  ??? Bed in low position/ wheels locked    _NM___ Pain:       ??? Assess pain level and characteristics  ??? Administer analgesics as ordered  ??? Assess effectiveness of pain management and report to MD as needed    M____ Knowledge Deficit:  ??? Assess baseline knowledge  ??? Provide teaching at level of understanding  ??? Provide teaching via preferred learning method  ??? Evaluate teaching effectiveness    _M___ Hemodynamic/Respiratory Status:       (Pre and Post Procedure Monitoring)  ??? Assess/Monitor vital signs and LOC  ??? Assess Baseline SpO2 prior to any sedation  ??? Obtain weight/height  ??? Assess vital signs/ LOC until patient meets discharge criteria  ??? Monitor procedure site and notify MD of any issues    _

## 2012-11-17 NOTE — Progress Notes (Signed)
1:18 PM PT ADMITTED TO OPND FOR NERVE BLOCK #2-PT STATES TO RELIEF OF LEG PAIN POST FIRST BLOCK-CONT. WITH BACK PAIN  1:31 PM PT NPO FOR 2 HRS  1:31 PM PEDAL PUSH AND PULL STRONG AND EQUAL.

## 2012-11-17 NOTE — Progress Notes (Signed)
2:40 PM PT RETURNS FROM X-RAY-BAND AID DRY AND INTACT TO LOWER BACK AREA  2:40 PM NEURO STATUS AS BEFORE-LUNCH OFFERED  3:14 PM UP IN ROOM-GAIT STEADY-INJECTION SITE SOFT AND DRY  3:15 PM WRITTEN DISCHARGE INSTRUCTIONS GIVEN TO PT-VERBALIZES UNDERSTANDING  3:15 PM PT DISCHARGED AMBULATORY Vitali Seibert A Sharnika Binney 11/17/2012 3:15 PM

## 2012-11-17 NOTE — Discharge Instructions (Signed)
NERVE BLOCK DISCHARGE INSTRUCTION    1.  Take it easy and rest the remainder of the day.  2.  Do not drive for the remainder of the day.  3.  You may use ice on the area of the injection to help alleviate discomfort.  Avoid heat for the remainder of the day.  4.  Do not soak in water or use a tub bath or hot tub for the remainder of the day.  5.  You may resume normal eating and drinking.  6. This evening you may resume your pain medications and any other medications you had stopped prior to the injection.  7.  Resume activities starting tomorrow in gradual manner.  Avoid activities with a lot of bending and twisting (golf, tennis, etc.) for at least a week.  You may resume physical therapy.  8.  Schedule an appointment to have another injection if this is the first or second of the series of three injections ordered.  Cancel 48 hrs prior the scheduled appointment if you are pain free.  Call [phone].  9.  Date: 12-14-12   Time:  2;00 pm           Be here at: 1;15 PM   Bring someone to drive you home.   Nothing to eat or drink 2 hrs prior.   No blood thinners or aspirin products 5 days prior.  10.  Notify Radiology [phone]), or go to the emergency room immediately if you develop any of the following symptoms: fever, chills, changes in mental status, severe pain, difficulty breathing, prolonged, severe headache, numbness or weakness in your arm or legs, loss of control of your bladder or bowel, excessive redness, swelling, or drainage from the area of injection.    ____________________________________________  Signature of patient, guardian, or other responsible party    Theresa Vargas 11/17/2012 2:43 PM

## 2012-11-17 NOTE — Progress Notes (Signed)
1409 Pt arrived to special procedure room for lumbar epirdural.   1410 Procedure explained using teach back method. Pt states understanding.  1411 Dr Consepcion HearingPunukollu into assess patient and explain procedure.  1412 This procedure has been fully reviewed with the patient and written informed consent has been obtained.   1414 Pt assisted to table in prone position. Comfort ensured.  1415 Pre-procedure images obtained.  1418 Procedure begins.  1421 Procedure complete.   1422 Pt assisted to cart in supine position. Comfort ensured.  1434 Pt transported to OPN in stable condition.

## 2012-12-20 MED ORDER — BUPIVACAINE HCL (PF) 0.25 % IJ SOLN
0.25 % | Freq: Once | INTRAMUSCULAR | Status: AC
Start: 2012-12-20 — End: 2012-12-20
  Administered 2012-12-20: 20:00:00 via INTRA_ARTICULAR

## 2012-12-20 MED ORDER — IOHEXOL 180 MG/ML IJ SOLN
180 MG/ML | Freq: Once | INTRAMUSCULAR | Status: AC | PRN
Start: 2012-12-20 — End: 2012-12-20
  Administered 2012-12-20: 20:00:00 via INTRAVENOUS

## 2012-12-20 MED ORDER — BUPIVACAINE HCL (PF) 0.25 % IJ SOLN
0.25 % | INTRAMUSCULAR | Status: AC
Start: 2012-12-20 — End: ?

## 2012-12-20 MED ORDER — METHYLPREDNISOLONE ACETATE 80 MG/ML IJ SUSP
80 MG/ML | INTRAMUSCULAR | Status: AC
Start: 2012-12-20 — End: ?

## 2012-12-20 MED ORDER — METHYLPREDNISOLONE ACETATE 80 MG/ML IJ SUSP
80 MG/ML | Freq: Once | INTRAMUSCULAR | Status: AC
Start: 2012-12-20 — End: 2012-12-20
  Administered 2012-12-20: 20:00:00 via EPIDURAL

## 2012-12-20 MED ORDER — LIDOCAINE HCL 2 % IJ SOLN
2 % | INTRAMUSCULAR | Status: AC
Start: 2012-12-20 — End: ?

## 2012-12-20 MED FILL — LIDOCAINE HCL 2 % IJ SOLN: 2 % | INTRAMUSCULAR | Qty: 20

## 2012-12-20 MED FILL — SENSORCAINE-MPF 0.25 % IJ SOLN: 0.25 % | INTRAMUSCULAR | Qty: 30

## 2012-12-20 MED FILL — DEPO-MEDROL 80 MG/ML IJ SUSP: 80 MG/ML | INTRAMUSCULAR | Qty: 1

## 2012-12-20 NOTE — Plan of Care (Signed)
M____ Safety:       (Environmental)  ??? Orient to environment  ??? Ensure ID band is correct and in place/ allergy band as needed  ??? Assess for fall risk  ??? Initiate fall precautions as applicable (fall band, side rails, etc.)  ??? Call light within reach  ??? Bed in low position/ wheels locked    NM____ Pain:       ??? Assess pain level and characteristics  ??? Administer analgesics as ordered  ??? Assess effectiveness of pain management and report to MD as needed    _M___ Knowledge Deficit:  ??? Assess baseline knowledge  ??? Provide teaching at level of understanding  ??? Provide teaching via preferred learning method  ??? Evaluate teaching effectiveness    M____ Hemodynamic/Respiratory Status:       (Pre and Post Procedure Monitoring)  ??? Assess/Monitor vital signs and LOC  ??? Assess Baseline SpO2 prior to any sedation  ??? Obtain weight/height  ??? Assess vital signs/ LOC until patient meets discharge criteria  ??? Monitor procedure site and notify MD of any issues    _

## 2012-12-20 NOTE — Discharge Instructions (Signed)
NERVE BLOCK DISCHARGE INSTRUCTION    1.  Take it easy and rest the remainder of the day.  2.  Do not drive for the remainder of the day.  3.  You may use ice on the area of the injection to help alleviate discomfort.  Avoid heat for the remainder of the day.  4.  Do not soak in water or use a tub bath or hot tub for the remainder of the day.  5.  You may resume normal eating and drinking.  6. This evening you may resume your pain medications and any other medications you had stopped prior to the injection.  7.  Resume activities starting tomorrow in gradual manner.  Avoid activities with a lot of bending and twisting (golf, tennis, etc.) for at least a week.  You may resume physical therapy.  8.Follow up with Dr. Seleta RhymesBowlus if any problems  9. If another injection is ordered, follow instructions below.    Bring someone to drive you home.   Nothing to eat or drink 2 hrs prior.   No blood thinners or aspirin products 5 days prior.  10.  Notify Radiology [phone]), or go to the emergency room immediately if you develop any of the following symptoms: fever, chills, changes in mental status, severe pain, difficulty breathing, prolonged, severe headache, numbness or weakness in your arm or legs, loss of control of your bladder or bowel, excessive redness, swelling, or drainage from the area of injection.    ____________________________________________  Signature of patient, guardian, or other responsible party    Theresa MainlandCharlotte R Dimitri Dsouza 12/20/2012 3:12 PM

## 2012-12-20 NOTE — Progress Notes (Signed)
Pt returned to unit. Band aid dry and intact to low back. No pain at present. Fluids given    1520: Pt up in room. gait steady.  Discharge instructions were given to patient. Verbalizes understanding.    1525: Pt discharged ambulatory in stable condition.

## 2012-12-20 NOTE — Progress Notes (Signed)
12:38 PM PT ADMITTED TO OPND FOR #3 NERVE BLOCK  12:38 PM PT NPO FOR 2 HRS   12:39 PM PEDAL PUSH AND PULL STRONG AND EQUAL  12:39 PM PATIENT RIGHTS AND RESPONSIBILITIES SHEET GIVEN TO PT TO READ.  2:12 PM TO X-RAY PER CART Harriett Sine A Brizeyda Holtmeyer 12/20/2012 2:13 PM

## 2012-12-20 NOTE — Progress Notes (Signed)
1420 Pt in specials radiology for lumbar epidural. Explained procedure to pt and pt verbalizes understanding. Consent signed.  1427 Dr Wendee Coppecanio to speak to pt.  1433 Positioned prone on table.  1442 Lumbar epidural complete per Dr Wendee Coppecanio. Pt tolerated well.  1449 Transferred to OPN per cart. [facility].

## 2013-07-19 ENCOUNTER — Inpatient Hospital Stay: Admit: 2013-07-19 | Discharge: 2013-07-19 | Disposition: A | Discharge status: Home / Self Care

## 2013-07-19 MED ORDER — CHLORZOXAZONE 500 MG PO TABS
500 MG | ORAL_TABLET | Freq: Four times a day (QID) | ORAL | Status: AC | PRN
Start: 2013-07-19 — End: 2013-07-29

## 2013-07-19 MED ORDER — IBUPROFEN 200 MG PO TABS
200 MG | Freq: Once | ORAL | Status: AC
Start: 2013-07-19 — End: 2013-07-19
  Administered 2013-07-19: 13:00:00 400 mg via ORAL

## 2013-07-19 MED FILL — IBU-200 200 MG PO TABS: 200 MG | ORAL | Qty: 2

## 2013-07-19 NOTE — ED Notes (Signed)
Pt presents to ED with fall. Pt states she was at work when she sat on a stool with wheels and fell of stool. Pt states she did hit her head. Pt denies passing out at any time. Pt rates pain 7/10 in head and hip. Will continue to monitor.     Theresa Vargas  07/19/13 0932

## 2013-07-19 NOTE — ED Provider Notes (Signed)
[facility]      CHIEF COMPLAINT       Chief Complaint   Patient presents with   ??? Fall       Nurses Notes reviewed and I agree except as noted in the HPI.      HISTORY OF PRESENT ILLNESS    Theresa Vargas is a 58 y.o. female who presents to emergency department patient states that while at work today she went to sit on a chair, the chair slid out from underneath her and she fell.  Patient complains of pain posterior neck, occipital head, right hip, right hand.  States pain is an ache 4-5/10.  Denies any loss of consciousness, nausea or vomiting, numbness paresthesias, any decreased range of motion of the extremities, any lower back pain, thoracic pain, visual hearing changes.     REVIEW OF SYSTEMS     Review of Systems   Constitutional: Negative for fever, chills, activity change, appetite change and fatigue.   HENT: Negative for congestion, drooling, ear discharge, ear pain, sore throat and trouble swallowing.    Eyes: Negative for pain, discharge and visual disturbance.   Respiratory: Negative for cough, chest tightness and shortness of breath.    Cardiovascular: Negative for chest pain and leg swelling.   Gastrointestinal: Negative for nausea, vomiting, abdominal pain, diarrhea and constipation.   Endocrine: Negative for cold intolerance and heat intolerance.   Genitourinary: Negative for dysuria, hematuria, flank pain and difficulty urinating.   Musculoskeletal: Positive for arthralgias and neck pain. Negative for joint swelling and neck stiffness.   Skin: Negative for pallor and rash.   Allergic/Immunologic: Negative for immunocompromised state.   Neurological: Positive for headaches. Negative for dizziness and light-headedness.   Hematological: Negative for adenopathy.   Psychiatric/Behavioral: Negative for confusion and agitation. The patient is not nervous/anxious.         PAST MEDICAL HISTORY    has a past medical history of Hypertension; Psychiatric problem; Nausea & vomiting; and  Diabetes mellitus ([place]).    SURGICAL HISTORY      has past surgical history that includes Cholecystectomy (2007); Hysterectomy (2007); Neck surgery (2008 [facility]); Tympanostomy tube placement [id number]); skin biopsy (2007); Colonoscopy; Hemorrhoid surgery (05/2010); and other surgical history (11/05/10).    CURRENT MEDICATIONS       Previous Medications    CHOLESTYRAMINE (QUESTRAN) 4 GM/DOSE POWDER    Take  by mouth as needed.    DESVENLAFAXINE (PRISTIQ) 50 MG TB24    Take 100 mg by mouth daily.    HYDROCODONE-ACETAMINOPHEN (NORCO) 5-325 MG PER TABLET    Take 1 tablet by mouth every 6 hours as needed for Pain    LORAZEPAM (ATIVAN) 1 MG TABLET    Take 1 mg by mouth every 6 hours as needed.      LOSARTAN (COZAAR) 50 MG TABLET    Take 100 mg by mouth daily.    METFORMIN (GLUCOPHAGE) 1000 MG TABLET    Take 1,000 mg by mouth Daily with supper.    METFORMIN (GLUCOPHAGE) 500 MG TABLET    Take 500 mg by mouth daily (with breakfast).    VORTIOXETINE (BRINTELLIX) 10 MG TABS TABLET    Take 10 mg by mouth daily    ZOLPIDEM (AMBIEN) 10 MG TABLET    Take 10 mg by mouth nightly as needed.         ALLERGIES     is allergic to anesthetic ether.    FAMILY HISTORY  has no family status information on file.  family history includes Diabetes in an other family member; Heart Disease in an other family member; Hypertension in an other family member; Mental Illness in an other family member; Other in an other family member.    SOCIAL HISTORY      reports that she has never smoked. She does not have any smokeless tobacco history on file. She reports that she does not drink alcohol or use illicit drugs.    PHYSICAL EXAM     INITIAL VITALS:  height is 5' 6"$  (1.676 m) and weight is 200 lb (90.719 kg). Her oral temperature is 98.3 ??F (36.8 ??C). Her blood pressure is 158/85 and her pulse is 76. Her respiration is 18 and oxygen saturation is 96%.    Physical Exam   Constitutional: She is oriented to person, place, and time. She appears  well-developed and well-nourished. No distress.   HENT:   Head: Normocephalic and atraumatic.   Mouth/Throat: Oropharynx is clear and moist.   Eyes: Conjunctivae are normal. Pupils are equal, round, and reactive to light. Right eye exhibits no discharge. Left eye exhibits no discharge.   Neck: Normal range of motion. Neck supple. No tracheal deviation present.   Cardiovascular: Normal rate and regular rhythm.    Pulmonary/Chest: Effort normal and breath sounds normal. No respiratory distress.   Abdominal: Soft. Bowel sounds are normal. There is no tenderness. There is no guarding.   Musculoskeletal: Normal range of motion.   Pain on palpation posterior neck.   Neurological: She is alert and oriented to person, place, and time. No cranial nerve deficit.   Skin: Skin is warm and dry. No rash noted. She is not diaphoretic. No pallor.   Psychiatric: She has a normal mood and affect. Her behavior is normal. Thought content normal.   Nursing note and vitals reviewed.      DIFFERENTIAL DIAGNOSIS:   Head contusion, cervical strain, hip contusion, hand injury, rule out fractures intercranial subdural    DIAGNOSTIC RESULTS     EKG: All EKG's are interpreted by the Emergency Department Physician who either signs or Co-signs this chart in the absence of a cardiologist.  None    RADIOLOGY: non-plain film images(s) such as CT, Ultrasound and MRI are read by the radiologist.  Plain radiographic images are visualized and preliminarily interpreted by the emergency physician unless otherwise stated below.  CT of the head and C-spine were negative as read by the radiologist; x-rays of the pelvis and right hand were negative as read by the radiologist.    LABS:   Labs Reviewed - No data to display    EMERGENCY DEPARTMENT COURSE:   Vitals:    Filed Vitals:    07/19/13 0914 07/19/13 1014   BP: 160/80 158/85   Pulse: 81 76   Temp: 98.3 ??F (36.8 ??C)    TempSrc: Oral    Resp: 18 18   Height: 5' 6"$  (1.676 m)    Weight: 200 lb (90.719 kg)     SpO2: 98% 96%     H&P is obtained.  CAT scan x-rays were ordered, results reviewed.  Patient is discharged home stable condition, home-going prescription for Parafon forte and ibuprofen patient was Norco at home.    CRITICAL CARE:   None    CONSULTS:      PROCEDURES:  None    FINAL IMPRESSION      1. Head contusion    2. Neck pain    3.  Right hip pain    4. Hand pain          DISPOSITION/PLAN       PATIENT REFERRED TO:  No follow-up provider specified.    DISCHARGE MEDICATIONS:  New Prescriptions    No medications on file       (Please note that portions of this note were completed with a voice recognition program.  Efforts were made to edit the dictations but occasionally words are mis-transcribed.)    Lajean Silvius, [place], [place]  07/19/13 1032

## 2013-07-19 NOTE — ED Notes (Signed)
Reassessment of the patients Fall   is unchanged, the patients pain reassessment is a 7/10, Side rails up times 2, call light in reach, will continue to monitor.      Theresa BuddLeslie A Wannetta Vargas  07/19/13 1015

## 2013-09-13 ENCOUNTER — Encounter

## 2014-05-01 ENCOUNTER — Encounter

## 2014-05-01 ENCOUNTER — Encounter: Admit: 2014-05-01 | Discharge: 2014-05-01 | Payer: PRIVATE HEALTH INSURANCE | Attending: Otolaryngology

## 2014-05-01 ENCOUNTER — Ambulatory Visit: Payer: PRIVATE HEALTH INSURANCE | Attending: Otolaryngology

## 2014-05-01 MED ORDER — HYDROGEN PEROXIDE 3 % EX MISC
3 % | Freq: Two times a day (BID) | CUTANEOUS | Status: DC
Start: 2014-05-01 — End: 2014-08-16

## 2014-05-01 MED ORDER — OFLOXACIN 0.3 % OT SOLN
0.3 % | Freq: Two times a day (BID) | OTIC | Status: DC
Start: 2014-05-01 — End: 2014-08-16

## 2014-05-01 NOTE — Progress Notes (Signed)
Subjective:      Patient ID: Theresa BuckerSandra A [place] is a 59 y.o. female. Patient is here for ear pain, thinks she might of puncture ear drum    HPI: When last seen in 2013, still had tube in left ear.  In today because stuck a Qtip in left ear, and ear ear bleeding X 2 days.    Review of Systems   Constitutional: Negative for fever, chills, diaphoresis, activity change, appetite change, fatigue and unexpected weight change.   HENT: Positive for ear pain, rhinorrhea and tinnitus. Negative for congestion, dental problem, drooling, ear discharge, facial swelling, hearing loss, mouth sores, nosebleeds, postnasal drip, sinus pressure, sneezing, sore throat, trouble swallowing and voice change.    Eyes: Negative for photophobia, pain, discharge, redness, itching and visual disturbance.   Respiratory: Negative for apnea, cough, choking, chest tightness, shortness of breath, wheezing and stridor.    Cardiovascular: Negative for chest pain, palpitations and leg swelling.   Gastrointestinal: Negative for nausea, vomiting, abdominal pain, diarrhea, constipation, blood in stool, abdominal distention, anal bleeding and rectal pain.   Endocrine: Negative for cold intolerance, heat intolerance, polydipsia, polyphagia and polyuria.   Genitourinary: Negative for dysuria, urgency, frequency, hematuria, flank pain, decreased urine volume, vaginal bleeding, vaginal discharge, enuresis, difficulty urinating, genital sores, vaginal pain, menstrual problem, pelvic pain and dyspareunia.   Musculoskeletal: Negative for myalgias, back pain, joint swelling, arthralgias, gait problem, neck pain and neck stiffness.   Skin: Negative for color change, pallor, rash and wound.   Allergic/Immunologic: Negative for environmental allergies, food allergies and immunocompromised state.   Neurological: Positive for dizziness. Negative for tremors, seizures, syncope, facial asymmetry, speech difficulty, weakness, light-headedness, numbness and headaches.    Hematological: Negative for adenopathy. Does not bruise/bleed easily.   Psychiatric/Behavioral: Negative for suicidal ideas, hallucinations, behavioral problems, confusion, sleep disturbance, self-injury, dysphoric mood, decreased concentration and agitation. The patient is not nervous/anxious and is not hyperactive.      Patient Active Problem List   Diagnosis   ??? OM (otitis media)   ??? ETD (eustachian tube dysfunction)   ??? Dizziness and giddiness   ??? Anal skin tag   ??? Tinnitus of both ears   ??? Chronic sinusitis   ??? Acute URI   ??? Hypertrophy of nasal turbinates   ??? Nasal septal deviation   ??? Status post myringotomy with insertion of tube   ??? Eustachian tube dysfunction   ??? Otitis media       Past Medical History   Diagnosis Date   ??? Hypertension    ??? Psychiatric problem    ??? Nausea & vomiting    ??? Diabetes mellitus [facility](HCC)         Past Surgical History   Procedure Laterality Date   ??? Cholecystectomy  2007   ??? Hysterectomy  2007   ??? Neck surgery  2008 [facility]     ACDF   ??? Tympanostomy tube placement  [id number]   ??? Skin biopsy  2007     left cheek   ??? Colonoscopy       2006   ??? Hemorrhoid surgery  05/2010   ??? Other surgical history  11/05/10     hemmroidectomy       Current Outpatient Prescriptions   Medication Sig Dispense Refill   ??? SEROQUEL XR 50 MG XR tablet      ??? losartan (COZAAR) 100 MG tablet      ??? PRISTIQ 100 MG TB24      ??? HYDROcodone-acetaminophen (  NORCO) 7.5-325 MG per tablet      ??? Hydrogen Peroxide 3 % MISC Place 5 drops in ear(s) 2 times daily Mix equal portions of hydrogen peroxide and distilled water and instill 7 drops into affected ear; wait 10 minutes; drain off; then instill antibiotic drops.  Do twice a day.  Stop peroxide after 6 days. 1 each 1   ??? ofloxacin (FLOXIN) 0.3 % otic solution Place 3 drops into the left ear 2 times daily 1 Bottle 0   ??? metformin (GLUCOPHAGE) 1000 MG tablet Take 1,000 mg by mouth Daily with supper.     ??? metformin (GLUCOPHAGE) 500 MG tablet Take 500 mg by mouth  daily (with breakfast).     ??? zolpidem (AMBIEN) 10 MG tablet   Take 10 mg by mouth nightly as needed for Sleep Takes every night     ??? lorazepam (ATIVAN) 1 MG tablet Take 1 mg by mouth every 6 hours as needed.         No current facility-administered medications for this visit.       Allergies   Allergen Reactions   ??? Anesthetic Ether Nausea Only     Reaction to Local and general       Family History   Problem Relation Age of Onset   ??? Diabetes Other    ??? Hypertension Other    ??? Mental Illness Other    ??? Heart Disease Other    ??? Other Other      bone and joint problems       History     Social History   ??? Marital Status: Married     Spouse Name: N/A     Number of Children: N/A   ??? Years of Education: N/A     Occupational History   ??? Not on file.     Social History Main Topics   ??? Smoking status: Never Smoker    ??? Smokeless tobacco: Not on file   ??? Alcohol Use: No   ??? Drug Use: No   ??? Sexual Activity: Not on file     Other Topics Concern   ??? Not on file     Social History Narrative       Objective:   Physical Exam  This is a 59 y.o. female. Patient is in no respiratory distress, alert and oriented to time and place. Not nasal, not hoarse. Not obviously hearing impaired.    Filed Vitals:    05/01/14 1307   BP: 110/70   Pulse: 96   Temp: 98.2 ??F (36.8 ??C)   TempSrc: Oral   Resp: 16   Height: 5' 6.5" (1.689 m)   Weight: 178 lb 8 oz (80.967 kg)       Head is normocephalic. PERL, EOM full,  no diplopia, no nystagmus. Conjunctivae pink, no discharge    Pinnae are WNL  R External auditory canal clear and free of any pathology  L  External auditory canal: old dried blood fills ear canal, unable to remove                                              Tympanic membranes:      R intact, translucent  L not seen    Nasal bones midline  Discharge:  none    No facial redness, tenderness or swelling    No facial weakness.    Salivary glands not enlarged or palpable    Lips, tongue and  oral cavity show tongue is midline, mobile.   Oropharynx: clear  Uvula midline.   Gag reflex present and symmetrical      Neck supple  Cervical adenopathy: none    Trachea midline  Thyroid not enlarged, no masses    Chest equal and symmetrical expansion, no retractions    Extremities: no clubbing, cyanosis or edema                        Gait normal    Cranial nerves grossly intact      Assessment:      1. Trauma of ear canal, initial encounter             Plan:       No orders of the defined types were placed in this encounter.     Orders Placed This Encounter   Medications   ??? Hydrogen Peroxide 3 % MISC     Sig: Place 5 drops in ear(s) 2 times daily Mix equal portions of hydrogen peroxide and distilled water and instill 7 drops into affected ear; wait 10 minutes; drain off; then instill antibiotic drops.  Do twice a day.  Stop peroxide after 6 days.     Dispense:  1 each     Refill:  1   ??? ofloxacin (FLOXIN) 0.3 % otic solution     Sig: Place 3 drops into the left ear 2 times daily     Dispense:  1 Bottle     Refill:  0         RTC: 2 weeks

## 2014-05-15 ENCOUNTER — Ambulatory Visit: Admit: 2014-05-15 | Discharge: 2014-05-15 | Payer: PRIVATE HEALTH INSURANCE | Attending: Otolaryngology

## 2014-05-15 DIAGNOSIS — S0991XD Unspecified injury of ear, subsequent encounter: Secondary | ICD-10-CM

## 2014-05-15 NOTE — Progress Notes (Signed)
Subjective:      Patient ID: Theresa Vargas is a 59 y.o. female. Patient is here for follow up after ear drops.     HPI: had scratched ear with QTip. In for follow up    Review of Systems   Constitutional: Negative for fever, chills, diaphoresis, activity change, appetite change, fatigue and unexpected weight change.   HENT: Positive for sinus pressure. Negative for congestion, dental problem, drooling, ear discharge, ear pain, facial swelling, hearing loss, mouth sores, nosebleeds, postnasal drip, rhinorrhea, sneezing, sore throat, tinnitus, trouble swallowing and voice change.    Eyes: Positive for visual disturbance. Negative for photophobia, pain, discharge, redness and itching.   Respiratory: Negative for apnea, cough, choking, chest tightness, shortness of breath, wheezing and stridor.    Cardiovascular: Negative for chest pain, palpitations and leg swelling.   Gastrointestinal: Negative for nausea, vomiting, abdominal pain, diarrhea, constipation, blood in stool, abdominal distention, anal bleeding and rectal pain.   Endocrine: Negative for cold intolerance, heat intolerance, polydipsia, polyphagia and polyuria.   Genitourinary: Negative for dysuria, urgency, frequency, hematuria, flank pain, decreased urine volume, vaginal bleeding, vaginal discharge, enuresis, difficulty urinating, genital sores, vaginal pain, menstrual problem, pelvic pain and dyspareunia.   Musculoskeletal: Negative for myalgias, back pain, joint swelling, arthralgias, gait problem, neck pain and neck stiffness.   Skin: Negative for color change, pallor, rash and wound.   Allergic/Immunologic: Negative for environmental allergies, food allergies and immunocompromised state.   Neurological: Positive for numbness and headaches. Negative for dizziness, tremors, seizures, syncope, facial asymmetry, speech difficulty, weakness and light-headedness.   Hematological: Negative for adenopathy. Does not bruise/bleed easily.    Psychiatric/Behavioral: Negative for suicidal ideas, hallucinations, behavioral problems, confusion, sleep disturbance, self-injury, dysphoric mood, decreased concentration and agitation. The patient is nervous/anxious. The patient is not hyperactive.      Patient Active Problem List   Diagnosis   ??? OM (otitis media)   ??? ETD (eustachian tube dysfunction)   ??? Dizziness and giddiness   ??? Anal skin tag   ??? Tinnitus of both ears   ??? Chronic sinusitis   ??? Acute URI   ??? Hypertrophy of nasal turbinates   ??? Nasal septal deviation   ??? Status post myringotomy with insertion of tube   ??? Eustachian tube dysfunction   ??? Otitis media       Past Medical History   Diagnosis Date   ??? Hypertension    ??? Psychiatric problem    ??? Nausea & vomiting    ??? Diabetes mellitus [facility])         Past Surgical History   Procedure Laterality Date   ??? Cholecystectomy  2007   ??? Hysterectomy  2007   ??? Neck surgery  2008 [facility]     ACDF   ??? Tympanostomy tube placement  [id number]   ??? Skin biopsy  2007     left cheek   ??? Colonoscopy       2006   ??? Hemorrhoid surgery  05/2010   ??? Other surgical history  11/05/10     hemmroidectomy       Current Outpatient Prescriptions   Medication Sig Dispense Refill   ??? VORTIoxetine (BRINTELLIX) 10 MG TABS tablet Take 10 mg by mouth daily     ??? SEROQUEL XR 50 MG XR tablet      ??? losartan (COZAAR) 100 MG tablet      ??? PRISTIQ 100 MG TB24      ??? HYDROcodone-acetaminophen (NORCO) 7.5-325 MG  per tablet      ??? Hydrogen Peroxide 3 % MISC Place 5 drops in ear(s) 2 times daily Mix equal portions of hydrogen peroxide and distilled water and instill 7 drops into affected ear; wait 10 minutes; drain off; then instill antibiotic drops.  Do twice a day.  Stop peroxide after 6 days. 1 each 1   ??? ofloxacin (FLOXIN) 0.3 % otic solution Place 3 drops into the left ear 2 times daily 1 Bottle 0   ??? metformin (GLUCOPHAGE) 1000 MG tablet Take 1,000 mg by mouth Daily with supper.     ??? metformin (GLUCOPHAGE) 500 MG tablet Take 500 mg  by mouth daily (with breakfast).     ??? zolpidem (AMBIEN) 10 MG tablet   Take 10 mg by mouth nightly as needed for Sleep Takes every night     ??? lorazepam (ATIVAN) 1 MG tablet Take 1 mg by mouth every 6 hours as needed.         No current facility-administered medications for this visit.       Allergies   Allergen Reactions   ??? Anesthetic Ether Nausea Only     Reaction to Local and general       Family History   Problem Relation Age of Onset   ??? Diabetes Other    ??? Hypertension Other    ??? Mental Illness Other    ??? Heart Disease Other    ??? Other Other      bone and joint problems       History     Social History   ??? Marital Status: Married     Spouse Name: N/A     Number of Children: N/A   ??? Years of Education: N/A     Occupational History   ??? Not on file.     Social History Main Topics   ??? Smoking status: Never Smoker    ??? Smokeless tobacco: Not on file   ??? Alcohol Use: No   ??? Drug Use: No   ??? Sexual Activity: Not on file     Other Topics Concern   ??? Not on file     Social History Narrative       Objective:   Physical Exam  This is a 59 y.o. female. Patient is in no respiratory distress, alert and oriented to time and place. Not nasal, not hoarse. Not obviously hearing impaired.    Filed Vitals:    05/15/14 1429   BP: 122/68   Pulse: 72   Temp: 99.1 ??F (37.3 ??C)   TempSrc: Oral   Resp: 12   Height: 5' 6.5" (1.689 m)   Weight: 176 lb 1.6 oz (79.878 kg)       Head is normocephalic. PERL, EOM full,  no diplopia, no nystagmus. Conjunctivae pink, no discharge    Pinnae are WNL  R External auditory canal clear and free of any pathology  L  External auditory canal : clump of dried blood removed. T tube in old blood                                               Tympanic membranes:      R intact, tanslucent  L intact, translucent          Assessment:      1. Trauma of ear canal, subsequent encounter            Plan:      Reviewed and discussed: Advised against using  Qtips.  RTC: prn

## 2014-05-16 ENCOUNTER — Encounter: Attending: Otolaryngology

## 2014-08-16 ENCOUNTER — Institutional Professional Consult (permissible substitution): Admit: 2014-08-16 | Discharge: 2014-08-16 | Payer: PRIVATE HEALTH INSURANCE | Attending: Pulmonary Disease

## 2014-08-16 DIAGNOSIS — R0683 Snoring: Secondary | ICD-10-CM

## 2014-08-16 NOTE — Progress Notes (Deleted)
Chief Complaint: [name] is here as a new patient for a sleep consult, referred by Dr. Seleta RhymesBowlus for snoring, and sleep apnea.    Mallampati airway Class: 4   Neck Circumference: 14.5 Inches    Epworth sleepiness score 08/16/14: 0
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
## 2014-08-16 NOTE — Patient Instructions (Signed)
Recommendations/Plan:  -Will schedule patient for polysomnogram in the sleep lab to evaluate for obstructive Vs central sleep apnea once her sleep hygiene and insomnia improved   - She was advised to discuss with Dr. Kandis CockingBruno,MD regarding Cognitive behavioral therapy for her insomia.  -We will see [name] A Jafari back in 1week after the sleep study to go over the sleep study results and further management options.  -She was educated about sleep restriction therapy and advised to practice to improve her insomnia.  -She was educated about stimulus control therapy and advise to practice.  -She was instructed to restrict her sleep schedule to 7 to 9 hours in a given 24hour period continuously.  -She was educated to practice good sleep hygiene practices. She was provided with a good sleep hygiene hand out.  -[name] was advised to make earlier appointment with my clinic if she develops any worsening of sleep symptoms. She verbalizes understanding.  - Kennedy BuckerSandra A Frankum was educated about my impression and plan. She verbalizes understanding.

## 2014-08-19 ENCOUNTER — Ambulatory Visit: Admit: 2014-08-19 | Discharge: 2014-08-19 | Payer: PRIVATE HEALTH INSURANCE | Attending: Cardiovascular Disease

## 2014-08-19 DIAGNOSIS — R079 Chest pain, unspecified: Secondary | ICD-10-CM

## 2014-08-19 MED ORDER — DILTIAZEM HCL ER COATED BEADS 240 MG PO CP24
240 MG | ORAL_CAPSULE | Freq: Every day | ORAL | 3 refills | Status: DC
Start: 2014-08-19 — End: 2014-12-03

## 2014-08-19 MED ORDER — HYDROCHLOROTHIAZIDE 25 MG PO TABS
25 MG | ORAL_TABLET | Freq: Every day | ORAL | 3 refills | Status: DC
Start: 2014-08-19 — End: 2014-12-03

## 2014-08-19 NOTE — Addendum Note (Signed)
Addended by: Jacqualyn PoseyHAWK, Kiernan Farkas A on: 08/19/2014 03:35 PM     Modules accepted: Orders

## 2014-08-19 NOTE — Progress Notes (Signed)
Theresa Vargas is a 59 y.o. female is here for chest pains and was lifting 5 pounds weight and developed sharpe and 10/10 and took nor co and asa and ativan without radiation and has had no new chest pains .  Intensity of the pain 10/10 , provocation of the pain none , what relieves the pain motrin was used and nor co and ativan where does  the pain migrates to  No where  and no nausea no fever and chills and no sob with and without activity. No evidence of swelling in legs no palpitations and no syncopal episodes and no dizziness ,no bleeding ,or urination  Problems ,no double visions ,no speech problems and no bowel problems or diarrhea and tolerating medications       Family history for cad mother     Patient Active Problem List   Diagnosis   ??? OM (otitis media)   ??? ETD (eustachian tube dysfunction)   ??? Dizziness and giddiness   ??? Anal skin tag   ??? Tinnitus of both ears   ??? Chronic sinusitis   ??? Acute URI   ??? Hypertrophy of nasal turbinates   ??? Nasal septal deviation   ??? Status post myringotomy with insertion of tube   ??? Eustachian tube dysfunction   ??? Otitis media     Past Medical History   Diagnosis Date   ??? Diabetes mellitus (HCC)    ??? Hypertension    ??? Nausea & vomiting    ??? Psychiatric problem      Social History   Substance Use Topics   ??? Smoking status: Never Smoker   ??? Smokeless tobacco: Never Used   ??? Alcohol use No     Allergies   Allergen Reactions   ??? Anesthetic Ether Nausea Only     Reaction to Local and general     Current Outpatient Prescriptions   Medication Sig Dispense Refill   ??? diltiazem (TIAZAC) 180 MG SR capsule Take 180 mg by mouth daily     ??? SEROQUEL XR 50 MG XR tablet Take 100 mg by mouth daily     ??? cyclobenzaprine (FLEXERIL) 10 MG tablet Take 10 mg by mouth nightly Indications: as needed     ??? ibuprofen (ADVIL;MOTRIN) 800 MG tablet Take 800 mg by mouth every 6 hours as needed for Pain     ??? VORTIoxetine (BRINTELLIX) 10 MG TABS tablet Take 10 mg by mouth daily     ??? PRISTIQ 100 MG TB24       ??? HYDROcodone-acetaminophen (NORCO) 7.5-325 MG per tablet      ??? metformin (GLUCOPHAGE) 1000 MG tablet Take 1,000 mg by mouth Daily with supper.     ??? metformin (GLUCOPHAGE) 500 MG tablet Take 500 mg by mouth daily (with breakfast).     ??? zolpidem (AMBIEN) 10 MG tablet   Take 10 mg by mouth nightly as needed for Sleep Takes every night     ??? lorazepam (ATIVAN) 1 MG tablet Take 1 mg by mouth every 6 hours as needed.         No current facility-administered medications for this visit.        REVIEW OF SYSTEM  Constitutional  symptoms such as fever chills and malaise and weakness  Are negative   HE ENT negative  HEART all negative  LUNGS all negative   ABDOMEN all negative  GENITAL URINARY all within normal limits  NEUROLOGY all negative  NECK all negative  SKIN all negative  MUSCULOSKELETAL negative  HEMATOLOGICAL negative  PSYCHIATRIC  negative    Vitals:    08/19/14 1432   BP: (!) 180/114   Site: Right Arm   Position: Sitting   Pulse: 97   Weight: 181 lb (82.1 kg)   Height: 5' 6.5" (1.689 m)       EXAMINATION   Gen. Appearance is reflective of nutritional normal nutrition and well-groomed   Appears  stated age    Carotid the amplitude of  carotid artery  And up stroke are present or diminished and bruits are absent   Thyroid palpation appears to be  normal  HEENT  teeth appears to be symmetrical without poor dentition and gums  and palate appears to be healthy and  head is normal cephalic  Without signs of trauma and eyes are without trauma no conjunctiva and Iids retracting and not retracted ptosis and no ptosis and without  erythematous changes and  Nose is symmetrical  without drainage  and ears are  without tinnitus  and throat is clear   JUGULAR VEINS  are not elevated and tongue is mid line no masses presence and no cannon A waves present   LYMPHATICS are without adenopathies at least on exam   CHEST  No biventricular heaves and thrills and no right ventricular heaves and examination without signs of  trauma and lesions  HEART not distant and regular rate and rhythm without   gallop signs and and no murmurs and systolic crescendo  Decrescendo  normal s1 and s2 sounds and no s3 and s4 split   Point of maximum intensity of the heartbeat  is at or displaced from the fifth intercostal space  LUNGS no   presence of intercostal retractions and no  use of the accessory muscles with a diaphragmatic movement present and not present pectus and pigeon chest and without wheezes and rhonchi and non diminished in all posterior lungs  and without rales  ABDOMEN abdominal bruit not present  And  Not present morbid obesity and with no   anasarca and no  pickwickian syndrome non distended without organomegaly and no rebound tenderness and bowel sound are present  all quadrants   NEUROLOGY all the cranial nerves are intact and no motor deficits  and no sensory deficits  MUSCULAR SKELETAL without signs of trauma and kyphosis and scoliosis  INTEGUMENTARY without tenting and skin rashes indentation and  dryness  NECK without lymph adenopathy   NEUROPSYCHIATRIC has no anxiety, no mood swings and depression  VASCULAR EXAM for carotid ,radial femoral arteries are  steady  and not diminished   Extremities no evidence of peripheral edema  And pulses are as follows     Assessment and plans    1. Chest pain, unspecified type    2. Essential hypertension        EKG with anterior wall mi     cardizem to 240 mg day   hctz 25 mg a day  Bmp in one week     patient was advised of the side effects of the medications and watch for any change in medical status if any of those side effects develop to call immediately and may consider  discontinuing the medicine after talking to [place] and  depending on the clinical scenario    Nuclear stress test is recommended  to patient to exclude ischemia  Due to chest pains and patient  agrees with that work up and its risks and benefits and potential  need for additional testing if stress test is abnormal such as  cardiac catheterization.  In addition patient is advised to avoid strenuous activity that not only  Includes all strenuous activities  and is not limited to sexual activities and driving heavy equipments and traveling long distances and hunting or even stressfully events  In the mean time while we a wait stress test testing.       Echocardiogram was ordered to exclude structural heart disease  Due to chest  pains and patient agrees too.    Patient was advised to return in 3 months for follow up or sooner if there is any change in care.    Electronically signed by Modena Nunnery, DO on 08/19/2014 at 2:54 PM

## 2014-08-19 NOTE — Progress Notes (Signed)
Patient here to establish cardiologist with c/o chest pain  HTN, and tachycardia  EKG done today

## 2014-08-22 NOTE — Telephone Encounter (Signed)
Get it sooner and increase potassium intake bananas and or orange juice

## 2014-08-22 NOTE — Telephone Encounter (Signed)
Pt called the office and said you started her on HCTZ and cardizem on Monday. SHEalso went to see Dr Seleta Rhymes on Monday and he started her on gabapentin. She is having charlie horses all over her body now. She is getting her BMP drawn on Monday unless you would like it sooner. She wants to know if this is all from the HCTZ or is it a combo of all three of them

## 2014-08-23 NOTE — Telephone Encounter (Signed)
Patient returned call. Informed on orders below. Will have BMP done today. Need to follow up with results.

## 2014-08-23 NOTE — Telephone Encounter (Signed)
Left message on both patient's home machine and voicemail to call office back.

## 2014-08-24 LAB — BASIC METABOLIC PANEL
Anion Gap: 16
BUN: 17 mg/dl (ref 10–20)
CO2: 29 mmol/L (ref 21–32)
Calcium: 10.9 mg/dl — ABNORMAL HIGH (ref 8.7–10.8)
Chloride: 94 mmol/L — ABNORMAL LOW (ref 95–111)
Creatinine: 1 mg/dl (ref 0.5–1.3)
EGFR IF NonAfrican American: 57 — ABNORMAL LOW (ref >60)
Glucose: 130 mg/dl — ABNORMAL HIGH (ref 70–100)
Potassium: 4.2 mmol/L (ref 3.5–5.4)
Sodium: 135 mmol/L (ref 134–147)
eGFR African American: 69 (ref >60)

## 2014-08-26 NOTE — Telephone Encounter (Signed)
None at this time

## 2014-08-26 NOTE — Telephone Encounter (Signed)
BMP results in chart from 08-23-14. Any recommendations?

## 2014-08-27 NOTE — Telephone Encounter (Signed)
Patient notified and voiced understanding.

## 2014-08-27 NOTE — Telephone Encounter (Signed)
LM for patient to call back.

## 2014-09-06 ENCOUNTER — Encounter: Admit: 2014-09-06

## 2014-09-06 ENCOUNTER — Inpatient Hospital Stay: Admit: 2014-09-06

## 2014-09-06 ENCOUNTER — Encounter: Admit: 2014-09-06 | Attending: Cardiovascular Disease

## 2014-09-06 DIAGNOSIS — R079 Chest pain, unspecified: Secondary | ICD-10-CM

## 2014-09-06 LAB — CARDIAC STRESS TEST EXERCISE ONLY: Left Ventricular Ejection Fraction: 71

## 2014-09-06 LAB — ECHOCARDIOGRAM COMPLETE 2D W DOPPLER W COLOR: Left Ventricular Ejection Fraction: 55

## 2014-09-06 MED ORDER — TECHNETIUM TC 99M SESTAMIBI - CARDIOLITE
Freq: Once | Status: AC | PRN
Start: 2014-09-06 — End: 2014-09-06
  Administered 2014-09-06: 17:00:00 31.2 via INTRAVENOUS

## 2014-09-06 MED ORDER — TECHNETIUM TC 99M SESTAMIBI - CARDIOLITE
Freq: Once | Status: AC | PRN
Start: 2014-09-06 — End: 2014-09-06
  Administered 2014-09-06: 15:00:00 9.2 via INTRAVENOUS

## 2014-09-06 MED FILL — TECHNETIUM TC 99M SESTAMIBI - CARDIOLITE: Qty: 0.4

## 2014-09-06 MED FILL — TECHNETIUM TC 99M SESTAMIBI - CARDIOLITE: Qty: 0.1

## 2014-09-11 NOTE — Telephone Encounter (Signed)
Why are u asking me this what  is up

## 2014-09-11 NOTE — Telephone Encounter (Signed)
Why are asking me this

## 2014-09-11 NOTE — Telephone Encounter (Signed)
PATIENT HAD STRESS TEST AND ECHO DONE ON 09/06/14, WHEN DO YOU WANT TO SEE HER BACK?  SHE HAS APPT SCHEDULED FOR NOVEMBER BUT SO YOU WANT HER BACK BEFORE THEN ? PLEASE ADVISE

## 2014-09-13 NOTE — Telephone Encounter (Signed)
Patient informed of ECHO and Stress Test results. She will keep 12-03-14 appointment.

## 2014-09-20 ENCOUNTER — Ambulatory Visit

## 2014-09-20 DIAGNOSIS — R0683 Snoring: Secondary | ICD-10-CM

## 2014-09-21 NOTE — Progress Notes (Signed)
Belanna arrived to sleep center for PSG study. Paper work and questions answered. R.Ross,RPSGT, RST

## 2014-09-23 LAB — BASELINE DIAGNOSTIC SLEEP STUDY

## 2014-09-23 NOTE — Progress Notes (Signed)
[facility]                       [address] [place]  [address]                                  SLEEP CENTER REPORT    PATIENT NAME:  Theresa Vargas, Theresa A.                DOB:      05/11/1955  MED REC NO:    [id number]                        ROOM:     SL   ACCOUNT NO:    [id number]                          ADMISSION DATE: 09/20/2014  PHYSICIAN:     Kathlyn Sacramento, M.D.          DATE OF STUDY:  09/20/2014    SLEEP STUDY REPORT    REFERRING PHYSICIAN:  Dr. Colin Ina, MD.    The patient's height is 56.5 inches, weight is 181 pounds with a BMI of 39.9    HISTORY:  The patient is a 59 year old female who was initially evaluated by  me on 08/16/2014.  The patient gave a history of snoring with witnessed  apneas.  The patient had associated comorbidities including depression and  diabetes mellitus.  The patient is scheduled for a sleep study to further  workup for sleep apnea.     METHODS:  The patient underwent digital polysomnography in compliance with the  standards and specifications from the AASM Manual including the simultaneous  recording of 3 EEG channels (F4-M1, C4-M1, and O2-M1 with back up electrodes  F3-M2, C3-M2, and O1-M2), 2 EOG channels (E1-M2, and E2-M1,), EMG (chin, left  and right leg), EKG, Nonin pulse oximetry with  less than 2 second averaging  time, body position, airflow recorded by oral-nasal thermal sensor and nasal  air pressure transducer, plus respiratory effort recorded by calibrated  respiratory inductance plethysmography (RIP), flow volume loop, sound and  video.  Sleep staging and scoring followed the standard put forth by the  American Academy of Sleep Medicine and utilized the 4A obstructive hypopnea  event desaturation of 4 percent or greater.    INTERPRETATION:  This is a baseline sleep study and the study was performed on  09/20/2014.  The study was started at 10:55 p.m. and was terminated at 5:22  Vargasm. with a total recording time of  386.9 minutes.  Sleep period time was  357.1 minutes and the total sleep time was 334.6 minutes.  Overall sleep  efficiency was 86.5%.  The sleep onset latency was 29.8 minutes and wake after  sleep onset was 22.5 minutes and REM sleep latency was 336.5 minutes.    SLEEP STAGING AND DISTRIBUTION SUMMARY:  Revealed the patient spent 18.5  minutes in stage I consisting of 5.5%, 274 minutes in stage II consisting of  81.9%, 22 minutes in stage III consisting of 6.6%, and 20.1 minutes in REM  sleep consisting of 6% of the total sleep time.    RESPIRATORY EVENT ANALYSIS:  Revealed the patient had a total of 1 apnea  event, which was central in nature.  The patient also had a  total of 79  obstructive hypopneas.  The total number of apneas and hypopneas recorded  during the study were 80 with the apnea-hypopnea index of 14.3.  The patient's  REM sleep apnea-hypopnea index was 3.    POSITION ANALYSIS:  Revealed the patient spent 143.5 minutes in supine  position and 109.1 minutes in the left lateral position and 82 minutes in the  right lateral position with a supine apnea-hypopnea index was 41.8, whereas  left lateral position apnea-hypopnea index was 1.1, and the right lateral  position apnea-hypopnea index was 2.9.    PERIODIC LIMB MOVEMENT ANALYSIS:  Revealed the patient had a total of zero  periodic limb movements.  The patient had a total of 28 spontaneous arousals  with a spontaneous arousal index of 5.    OXYGEN SATURATION MONITORING:  Revealed the patient had a maximum oxygen  desaturation to 88% with a mean oxygen saturation of 92.8%.  The patient spent  a total of 0.8 minutes below oxygen saturation less than 88%.    EKG monitoring revealed a normal sinus rhythm.  The patient was found to have  mild snoring during the sleep study.  The patient took Ambien 10 mg as a sleep  aid before the onset of the sleep study.    IMPRESSION:  1.  Mild obstructive sleep apnea with worsening of respiratory events during  supine  position.  2.  Decreased and delayed REM sleep.  3.  Depression.  4.  Hypertension.  5.  Chronic back pain, currently on treatment with Norco.  6.  Panic disorder.  7.  Diabetes mellitus.    RECOMMENDATIONS:  1.  For the patient's sleep disordered breathing, the patient needs treatment.  2.  If the patient chooses to go for CPAP titration as a treatment, the  patient should be scheduled for a CPAP titration and follow with my clinic in  6-8 weeks on recommended CPAP therapy for clinical evaluation with review of  download.  3.  If the patient wishes to discuss her sleep study report, the patient  should be scheduled for followup with my clinic as soon as possible.    I have reviewed the raw data of the above sleep study epoch by epoch and  interpreted.    Thanks to Dr. Colin Ina for giving me this opportunity to participate in  the care of this pleasant lady.        Kathlyn Sacramento, M.D.      D: 09/23/2014 19:02:00  T: 09/23/2014 20:21:30  SC/nts  Job#: [id number]  Doc#: [id number]

## 2014-10-01 ENCOUNTER — Ambulatory Visit
Admit: 2014-10-01 | Discharge: 2014-10-01 | Payer: PRIVATE HEALTH INSURANCE | Attending: Physical Medicine & Rehabilitation

## 2014-10-01 DIAGNOSIS — M47816 Spondylosis without myelopathy or radiculopathy, lumbar region: Secondary | ICD-10-CM

## 2014-10-01 NOTE — Patient Instructions (Addendum)
Learning About Medial Branch Block and Neurotomy  What are medial branch block and neurotomy?     Facet joints connect your vertebrae to each other. Problems in these joints can cause chronic (long-term) pain in the neck or back. They can sometimes affect the shoulders, arms, buttocks, or legs.  Medial branch nerves are the nerves that carry many of the pain messages from your facet joints.  Radiofrequency medial branch neurotomy is a type of medial branch neurotomy that is used to relieve arthritis pain. It uses radio waves to damage nerves in your neck or back so that they can no longer send pain messages to your brain.  Before your doctor knows if a neurotomy will help you, he or she will do a medial branch block to find out if certain nerves are the ones that are a source of your pain. You will need two separate visits to the outpatient center or hospital to have both procedures.  How is a medial branch block done?  The doctor will use a tiny needle to numb the skin where you will get the block. Then he or she puts the block needle into the numbed area. You may feel some pressure, but you should not feel pain. Using fluoroscopy (live X-ray) to guide the needle, the doctor injects medicine onto one or more nerves to make them numb.  If you get relief from your pain in the next 4 to 6 hours, it's a sign that those nerves may be contributing to your pain. The relief will last only a short time. You may then have a medial branch neurotomy at a later visit to try to get longer relief.  It takes 20 to 30 minutes to get the block. You can go home after the doctor watches you for about an hour. You will get instructions on how to report how much pain you have when you are at home.  You will need someone to drive you home.  How is medial branch neurotomy done?  The doctor will use a tiny needle to numb the skin where you will get the neurotomy. Then he or she puts the neurotomy needle into the numbed area. You may feel  some pressure. Using fluoroscopy (live X-ray) to guide the needle, the doctor sends radio waves through the needle to the nerve for 60 to 90 seconds. The radio waves heat the nerve, which damages it. The doctor may do this several times. And he or she may treat more than one nerve.  It takes 45 to 90 minutes to get a neurotomy, depending on how many nerves are heated. You will probably go home 30 to 60 minutes later.  You will need someone to drive you home.  What can you expect after a neurotomy?  You may feel a little sore or tender at the injection site at first. But after a successful neurotomy, most people have pain relief right away. It often lasts for 9 to 12 months or longer. Sometimes the pain relief is permanent.  If your pain does come back, it may mean that the damaged nerve has healed and can send pain messages again. Or it can mean that a different nerve is causing pain. Your doctor will discuss your options with you.  Follow-up care is a key part of your treatment and safety. Be sure to make and go to all appointments, and call your doctor if you are having problems. It's also a good idea to know your test results   and keep a list of the medicines you take.   Where can you learn more?   Go to [email or web address] and sign in to your MyChart account. Enter T494 in the Search Health Information box to learn more about ???Learning About Medial Branch Block and Neurotomy.???    If you do not have an account, please click on the ???Sign Up Now??? link.   ?? 2006-2016 [employer]. Care instructions adapted under license by [facility]. This care instruction is for use with your licensed healthcare professional. If you have questions about a medical condition or this instruction, always ask your healthcare professional. [employer] disclaims any warranty or liability for your use of this information.  Content Version: 10.8.513193; Current as of: May 25, 2013

## 2014-10-01 NOTE — Progress Notes (Signed)
[facility]    Physical Medicine & Rehabilitation  Outpatient Consult Note         Chief Complaint:    Chief Complaint   Patient presents with   ??? New Patient     Lumbar disc disease        Referring Provider: Alena Bills, MD    History of Present Illness:  I had the opportunity to evaluate Theresa Vargas today in my office.  As you know, she   is a 59 y.o. female who presents for evaluation of both lumbar and cervical complaints today.    We first discussed lumbar issues.  She has a long-standing history of lumbar pain which has lasted several years.  She has been through therapy, chiropractics, imaging, conservative medication management, and epidural steroidal injections.  Transient benefit from any of the therapeutic activities mentioned above.  Her last MRI was in 2014 was reviewed with her today.  There is a previous disc extrusion on prior imaging that was improved, but there is considerable facet arthropathy in the lower lumbar region.  Patient describes that she has continuous pain that worsens throughout the day.  She works as a Engineer, civil (consulting) at [employer] and the workday significantly increases her pain.  She is with considerable pain as well when she tries to become more physically active.  Current medications prescribed by PCP for relief symptoms include Norco and cyclobenzaprine.    She also describes pain in the left cervical spine region referred into left upper limb.  This happens with increased activities.  She reports that has been slowly progressive since she had a cervical surgery in 2008.  At first not so severe but now becoming worse.  She would like some recommendations on further workup and management of this issue as well.  She denies any persistent paresthesias or weakness.  She reports on the pain which refers down the arm at certain times when more active.    Past Medical and Surgical History:    The patient has a past medical history of Diabetes  mellitus (HCC); Hypertension; Nausea & vomiting; and Psychiatric problem.    The patient has a past surgical history that includes Cholecystectomy (2007); Hysterectomy (2007); Neck surgery (2008 [facility]); Tympanostomy tube placement (1610,9604); skin biopsy (2007); Colonoscopy; Hemorrhoid surgery (05/2010); and other surgical history (11/05/10).     PCP: Beverly Gust. Bowlus, MD    Allergies:    Allergies   Allergen Reactions   ??? Anesthetic Ether Nausea Only     Reaction to Local and general        Current Medications:     Current Outpatient Prescriptions:   ???  HYDROcodone-acetaminophen (NORCO) 5-325 MG per tablet, Take by mouth 2 times daily as needed, Disp: , Rfl:   ???  SEROQUEL XR 50 MG XR tablet, Take 100 mg by mouth daily, Disp: , Rfl:   ???  hydrochlorothiazide (HYDRODIURIL) 25 MG tablet, Take 1 tablet by mouth daily, Disp: 30 tablet, Rfl: 3  ???  diltiazem (CARDIZEM CD) 240 MG ER capsule, Take 1 capsule by mouth daily, Disp: 30 capsule, Rfl: 3  ???  cyclobenzaprine (FLEXERIL) 10 MG tablet, Take 10 mg by mouth nightly Indications: as needed, Disp: , Rfl:   ???  ibuprofen (ADVIL;MOTRIN) 800 MG tablet, Take 800 mg by mouth every 6 hours as needed for Pain, Disp: , Rfl:   ???  VORTIoxetine (BRINTELLIX) 10 MG TABS tablet, Take 10 mg by mouth daily, Disp: ,  Rfl:   ???  PRISTIQ 100 MG TB24, Take by mouth daily , Disp: , Rfl:   ???  metformin (GLUCOPHAGE) 1000 MG tablet, Take 1,000 mg by mouth Daily with supper., Disp: , Rfl:   ???  metformin (GLUCOPHAGE) 500 MG tablet, Take 500 mg by mouth daily (with breakfast)., Disp: , Rfl:   ???  zolpidem (AMBIEN) 10 MG tablet, Take 10 mg by mouth nightly Takes every night, Disp: , Rfl:   ???  lorazepam (ATIVAN) 1 MG tablet, Take 1 mg by mouth every 6 hours as needed.  , Disp: , Rfl:      Social History:  Social History     Social History   ??? Marital status: Married     Spouse name: N/A   ??? Number of children: N/A   ??? Years of education: N/A     Occupational History   ??? Not on file.     Social  History Main Topics   ??? Smoking status: Never Smoker   ??? Smokeless tobacco: Never Used   ??? Alcohol use No   ??? Drug use: No   ??? Sexual activity: Not on file     Other Topics Concern   ??? Not on file     Social History Narrative     Works at [employer] as Engineer, civil (consulting).      Family History:   family history includes Diabetes in an other family member; Heart Disease in an other family member; Hypertension in an other family member; Mental Illness in an other family member; Other in an other family member.    Review of Systems:  Review of Systems   Constitutional: Negative for chills, fever, malaise/fatigue and weight loss.   HENT: Negative for hearing loss.    Eyes: Negative for blurred vision and double vision.   Respiratory: Negative for cough and shortness of breath.    Cardiovascular: Negative for chest pain, palpitations, claudication and leg swelling.   Gastrointestinal: Negative for abdominal pain, constipation, diarrhea, heartburn, nausea and vomiting.   Genitourinary: Negative for dysuria, frequency and urgency.   Musculoskeletal: Positive for back pain and neck pain. Negative for joint pain and myalgias.   Skin: Negative for itching and rash.   Neurological: Negative for dizziness, tingling, tremors, sensory change, speech change, focal weakness, weakness and headaches.   Psychiatric/Behavioral: Negative for depression, hallucinations and memory loss. The patient is not nervous/anxious and does not have insomnia.        Physical Exam:  Visit Vitals   ??? BP 135/87 (Site: Right Arm, Position: Sitting, Cuff Size: Small Adult)   ??? Pulse 96   ??? Ht 5\' 6"  (1.676 m)   ??? Wt 182 lb 6.4 oz (82.7 kg)   ??? BMI 29.44 kg/m2     Physical Exam   Constitutional: She is oriented to person, place, and time. She appears well-developed and well-nourished.   Cardiovascular: Intact distal pulses.    Musculoskeletal: She exhibits no edema.        Cervical back: She exhibits decreased range of motion, tenderness and pain.        Lumbar  back: She exhibits decreased range of motion and tenderness (worse with lumbar extension).        Back:         Left upper arm: She exhibits no swelling, no edema and no deformity.        Left forearm: She exhibits no tenderness, no swelling and no edema.   Neurological: She  is alert and oriented to person, place, and time. She has normal strength and normal reflexes. She displays no tremor. No cranial nerve deficit or sensory deficit. She exhibits normal muscle tone. Coordination and gait normal.   Spurling negative bilateral.   Skin: Skin is warm and dry. No rash noted. No erythema.   Psychiatric: She has a normal mood and affect. Her speech is normal and behavior is normal. Judgment and thought content normal. Cognition and memory are normal.       Diagnostics:  MRI lumbar 07/2012:   At L4-5, there are moderate facet degenerative changes. ??There is loss of   ???? ?? disc height. ??There is mild spinal canal stenosis. ??There is stenosis of   ???? ?? both lateral recesses. ??There is mild left foraminal stenosis. ??The   ???? ?? previously visualized focal disc protrusion at this level is no longer   ???? ?? present.     At L5-S1, there are facet degenerative changes. ??These are mild. ??There is   ???? ?? loss of disc height. ??There is no spinal canal stenosis. ??There is mild   ???? ?? bilateral foraminal stenosis.     Impression:  1. Lumbar pain  2. Lumbar spondylosis  3. Left cervical radiculopathy s/p prior ACDF 2008    Plan:  1. Referral to pain management for MBB and possible progression to RFA of L4-5 and L5-S1 bilateral facet  2. Continue to encourage physical activity and weight loss  3. Continue work without specific restrictions at this time  4. EMG/NCS of left upper limb for possible acute left cervical radiculopathy with history of prior cervical surgery    Return in about 6 weeks (around 11/12/2014).     It was my pleasure to evaluate Theresa Vargas today.  Please call with any concerns or questions.  50 minutes spent in  evaluation efforts    Garald Braver, MD

## 2014-10-02 ENCOUNTER — Ambulatory Visit: Admit: 2014-10-02 | Discharge: 2014-10-02 | Payer: PRIVATE HEALTH INSURANCE | Attending: Pulmonary Disease

## 2014-10-02 DIAGNOSIS — Z9989 Dependence on other enabling machines and devices: Secondary | ICD-10-CM

## 2014-10-02 NOTE — Progress Notes (Deleted)
Chief Complaint:  Theresa Vargas is here for a f/u for results of sleep study  Mallampati airway Class 4 Inches    Epworth sleepiness score 10/02/14: .0      Diagnostic Data: Sleep Study 09/20/14 AHI 14.3

## 2014-10-02 NOTE — Patient Instructions (Signed)
Recommendations/Plan:  -I had a discussion with patient regarding avialable treatment options for her sleep disorder breathing including but not limited to CPAP titration in the sleep lab Vs.Dental appliance placement with referral to a local dentist Vs other available surgical options including Uvulopalatopharyngoplasty, maxillomandibular ostomy and tracheostomy as last option. At the end of discussion, she decided to go for the CPAP titration.  -she advised to keep good compliance with recommended positive airway pressure therapy to get optimal results and clinical improvement.  -Follow with my clinic in 6 to 8 weeks after starting on positive airway pressure therapy  for clinical reevaluation with review of download. Sheadvised to bring her CPAP/BiPAP/AutoSV machine or smart memory card from machine for download review.  -She was advised to call DME company regarding supplies if needed.  -She was advised to loose weight by controlling diet and doing exercise once cleared by her family physician.   -She was instructed to not to drive any motor vehicles or operate heavy equipment if she feels sleepy. She verbalizes understanding.  -She was advised to call my office for earlier appointment if needed for worsening of sleep symptoms.  -Theresa Vargas was educated about my impression and plan. She verbalizes understanding.

## 2014-10-02 NOTE — Progress Notes (Signed)
Sleep Medicine clinic  Follow up for Sleep Apnea    [name]       Chief Complaint:  [name] is here for a f/u for results of sleep study                                         [name] is a 59 y.o.oldfemale came for follow up regarding her sleep study results. She underwent sleep study on 09/20/2014. She still complaining of excessive day time sleepiness with no improvement compared to last visit.    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 She denies any difficulty in sleeping at new places     Review of Systems   General/Constitutional: she gained 1 lb of weight from the last visit with normal appetite. No fever or chills.  HENT: Negative.   Eyes: Negative.  Upper respiratory tract: No nasal stuffiness or post nasal drip.  Lower respiratory tract/ lungs: No cough or sputum production. No hemoptysis.  Cardiovascular: No palpitations or chest pain.  Gastrointestinal: No nausea or vomiting.  Neurological: No focal neurologiacal weakness.  Extremities: No edema.  Musculoskeletal: No complaints.  Genitourinary: No complaints.  Hematological: Negative.   Psychiatric/Behavioral: Negative.   Skin: No itching.          Past Medical History   Diagnosis Date   ??? Diabetes mellitus (HCC)    ??? Hypertension    ??? Nausea & vomiting    ??? Psychiatric problem        Past Surgical History   Procedure Laterality Date   ??? Cholecystectomy  2007   ??? Hysterectomy  2007   ??? Neck surgery  [facility]     ACDF   ??? Tympanostomy tube placement  [id number]   ??? Skin biopsy  2007     left cheek   ??? Colonoscopy       2006   ??? Hemorrhoid surgery  05/2010   ??? Other surgical history  11/05/10     hemmroidectomy       Social History   Substance Use Topics   ??? Smoking status: Never Smoker   ??? Smokeless tobacco: Never Used   ??? Alcohol use No       Allergies   Allergen Reactions   ??? Anesthetic Ether Nausea Only     Reaction to Local and general       Current Outpatient Prescriptions   Medication  Sig Dispense Refill   ??? HYDROcodone-acetaminophen (NORCO) 5-325 MG per tablet Take by mouth 2 times daily as needed     ??? SEROQUEL XR 50 MG XR tablet Take 100 mg by mouth daily     ??? hydrochlorothiazide (HYDRODIURIL) 25 MG tablet Take 1 tablet by mouth daily 30 tablet 3   ??? diltiazem (CARDIZEM CD) 240 MG ER capsule Take 1 capsule by mouth daily 30 capsule 3   ??? cyclobenzaprine (FLEXERIL) 10 MG tablet Take 10 mg by mouth nightly Indications: as needed     ??? ibuprofen (ADVIL;MOTRIN) 800 MG tablet Take 800 mg by mouth every 6 hours as needed for Pain     ??? VORTIoxetine (BRINTELLIX) 10 MG TABS tablet Take 10 mg by mouth daily     ??? PRISTIQ 100 MG TB24 Take by mouth daily      ??? metformin (GLUCOPHAGE) 1000 MG tablet Take  1,000 mg by mouth Daily with supper.     ??? metformin (GLUCOPHAGE) 500 MG tablet Take 500 mg by mouth daily (with breakfast).     ??? zolpidem (AMBIEN) 10 MG tablet Take 10 mg by mouth nightly Takes every night     ??? lorazepam (ATIVAN) 1 MG tablet Take 1 mg by mouth every 6 hours as needed.         No current facility-administered medications for this visit.        Family History   Problem Relation Age of Onset   ??? Diabetes Other    ??? Hypertension Other    ??? Mental Illness Other    ??? Heart Disease Other    ??? Other Other      bone and joint problems          Visit Vitals   ??? BP 136/82   ??? Pulse 91   ??? Ht 5' 6.5" (1.689 m)   ??? Wt 182 lb 9.6 oz (82.8 kg)   ??? SpO2 95%  Comment: room air at rest   ??? BMI 29.03 kg/m2        BMI:  Body mass index is 29.03 kg/(m^2).     Mallampati airway Class 4 Inches  Epworth sleepiness score 10/02/14: .0  ( On further questioning she gives a history of hypersomnia ( Excessive daytime sleepiness) with daytime sleepy attacks. No reported motor vehicle accidents due to her sleepiness).    Physical Exam :  Constitutional: Patient appears moderately built and moderately nourished. No distress. Patient is oriented to person, place, and time.  HENT:   Head: Normocephalic and atraumatic.    Right Ear: External ear normal.   Left Ear: External ear normal.   Mouth/Throat: Oropharynx is clear and moist.   Eyes: Conjunctivae are normal. Pupils are equal and reactive to light. No scleral icterus.   Neck: Neck supple. No JVD present.   Cardiovascular: Normal rate, regular rhythm, normal heart sounds. No murmur heard.   Pulmonary/Chest: Effort normal and breath sounds normal. No stridor. No respiratory distress.  No wheezes. No rales.   Abdominal: Soft. Patient exhibits no distension. No tenderness.   Musculoskeletal: Normal range of motion.   Extremities: Patient exhibits no erythema or no edema.   Lymphadenopathy:  No cervical adenopathy.   Neurological: Patient is alert and oriented to person, place, and time.   Skin: Skin is warm and dry. Patient is not diaphoretic.   Psychiatric: Patient  has a normal mood and affect.      Diagnostic Data: Sleep Study 09/20/14 AHI 14.3.    PATIENT NAME: Theresa Vargas, Theresa A. DOB: 1955-11-12  MED REC NO: [id number] ROOM: SL   ACCOUNT NO: [id number] ADMISSION DATE: 09/20/2014  PHYSICIAN: Kathlyn Sacramento, M.D.         DATE OF STUDY: 09/20/2014     RESPIRATORY EVENT ANALYSIS: Revealed the patient had a total of 1 apnea  event, which was central in nature. The patient also had a total of 79  obstructive hypopneas. The total number of apneas and hypopneas recorded  during the study were 80 with the apnea-hypopnea index of 14.3. The patient's  REM sleep apnea-hypopnea index was 3.     IMPRESSION:  1. Mild obstructive sleep apnea with worsening of respiratory events during  supine position.  2. Decreased and delayed REM sleep.  3. Depression.  4. Hypertension.  5. Chronic back pain, currently on treatment with Norco.  6. Panic disorder.  7. Diabetes mellitus.  Assesment:  - Mild obstructive sleep apnea with worsening of respiratory events during supine position.  -Decreased and delayed REM sleep.  -Hypersomnia ( Excessive daytime sleepiness) due to obstructive sleep  apnea.  -Depression on meds. She is currently following with Dr.Bruno,MD  -Hypertension under control  -Chronic back pain, currently on treatment with Norco.  -Panic disorder.  -Diabetes mellitus.      Recommendations/Plan:  -I had a discussion with patient regarding avialable treatment options for her sleep disorder breathing including but not limited to CPAP titration in the sleep lab Vs.Dental appliance placement with referral to a local dentist Vs other available surgical options including Uvulopalatopharyngoplasty, maxillomandibular ostomy and tracheostomy as last option. At the end of discussion, she decided to go for the CPAP titration.  -she advised to keep good compliance with recommended positive airway pressure therapy to get optimal results and clinical improvement.  -Follow with my clinic in 6 to 8 weeks after starting on positive airway pressure therapy  for clinical reevaluation with review of download. Sheadvised to bring her CPAP/BiPAP/AutoSV machine or smart memory card from machine for download review.  -She was advised to call DME company regarding supplies if needed.  -She was advised to loose weight by controlling diet and doing exercise once cleared by her family physician.   -She was instructed to not to drive any motor vehicles or operate heavy equipment if she feels sleepy. She verbalizes understanding.  -She was advised to call my office for earlier appointment if needed for worsening of sleep symptoms.  -Theresa Vargas was educated about my impression and plan. She verbalizes understanding.

## 2014-10-03 ENCOUNTER — Encounter: Attending: Physical Medicine & Rehabilitation

## 2014-10-08 ENCOUNTER — Encounter: Attending: Neurology

## 2014-10-09 ENCOUNTER — Ambulatory Visit

## 2014-10-09 DIAGNOSIS — Z9989 Dependence on other enabling machines and devices: Secondary | ICD-10-CM

## 2014-10-10 ENCOUNTER — Institutional Professional Consult (permissible substitution): Admit: 2014-10-10 | Discharge: 2014-10-10 | Payer: PRIVATE HEALTH INSURANCE | Attending: Pain Medicine

## 2014-10-10 ENCOUNTER — Encounter

## 2014-10-10 DIAGNOSIS — M47816 Spondylosis without myelopathy or radiculopathy, lumbar region: Secondary | ICD-10-CM

## 2014-10-10 LAB — SLEEP STUDY WITH PAP TITRATION

## 2014-10-10 NOTE — Progress Notes (Signed)
[facility]                       [address] [place]  [address]                                  SLEEP CENTER REPORT    PATIENT NAME:  Theresa Vargas, Theresa A.                DOB:      09/01/1955  MED REC NO:    [id number]                        ROOM:     SL   ACCOUNT NO:    [id number]                          ADMISSION DATE: 10/09/2014  PHYSICIAN:     Kathlyn Sacramento, M.D.          DATE OF STUDY:  10/09/2014    REPORT TITLE:  CPAP titration study report.    REFERRING PHYSICIAN:  Dr. Colin Ina.    EXAM:  The patient's height is 66.5 inches and weight is 182 pounds with a BMI  of 28.9    HISTORY:  The patient is a 59 year old female diagnosed with mild obstructive  sleep apnea with worsening of respiratory events during supine position.  The  patient had a sleep study performed on 09/20/2014 with the apnea-hypopnea  index of 14.3.  The patient had associated comorbidities including  hypertension, depression and diabetes mellitus.    METHODS:  The patient underwent digital polysomnography in compliance with the  standards and specifications from the AASM Manual including the simultaneous  recording of 3 EEG channels (F4-M1, C4-M1, and O2-M1 with back up electrodes  F3-M2, C3-M2, and O1-M2), 2 EOG channels (E1-M2, and E2-M1,), EMG (chin, left  and right leg), EKG, Nonin pulse oximetry with  less than 2 second averaging  time, body position, airflow recorded by oral-nasal thermal sensor and nasal  air pressure transducer, plus respiratory effort recorded by calibrated  respiratory inductance plethysmography (RIP), flow volume loop, sound and  video.  Sleep staging and scoring followed the standard put forth by the  American Academy of Sleep Medicine and utilized the 4A obstructive hypopnea  event desaturation of 4 percent or greater. CPAP titration.    I have reviewed the raw data of the above sleep study epoch by epoch and  interpreted.    INTERPRETATION:  This is a CPAP  titration study, and the study was performed  on 10/09/2014.  The study was started at 11:14 p.m. and was terminated at 5:18  Vargasm. with a total recording time of 364 minutes.  Sleep period time was 346.8  minutes, and total sleep time was 341.8 minutes.  Overall sleep efficiency was  93.9%.  The sleep onset latency was 17.2 minutes and wake after sleep onset  was 5 minutes, and the REM sleep latency was 185 minutes.    SLEEP STAGING AND DISTRIBUTION SUMMARY:  Revealed the patient spent 5.5  minutes in stage I consisting of 1.6%, 249.8 minutes in stage II consisting of  73.1%, 57 minutes in stage III consisting of 16.7%, 29.5 minutes in REM sleep  consisting of 8.6% of total sleep time.    The CPAP Titration  Study was started with a CPAP pressure of 5 cm of water and  the CPAP pressure was gradually increased to a CPAP pressure of 7 cm of water  by titrating to apneas and hypopneas.  At a CPAP pressure of 7 cm of water,  the patient spent 3 hours 31 minutes in bed.  Out of 3 hours 31 minutes, the  patient slept for a period of 29.3 minutes in REM sleep and 2 hours 59 minutes  in non-REM sleep.  At a CPAP pressure of 7 cm of water, the patient did not  have any apneas or hypopneas with apnea-hypopnea index of 0.  The maximum  oxygen desaturation recorded at this pressure was 92% with a mean oxygen  saturation of 94.4%.    PERIODIC LIMB MOVEMENT ANALYSIS:  Revealed the patient did not have any  periodic limb movements.    The patient had a total of 14 spontaneous arousals with a spontaneous arousal  index of 2.5.    EKG monitoring revealed a normal sinus rhythm.    IMPRESSION:  1.  Mild obstructive sleep apnea.  The patient had optimal titration to a CPAP  pressure of 7 cm of water.  2.  Decreased and delayed REM sleep.  3.  Hypertension.  4.  Depression.  5.  Chronic back pain, currently on treatment with Norco.  6.  Diabetes mellitus.    RECOMMENDATIONS:  1.  For the patient's sleep disordered breathing, we recommend  starting  therapy with a CPAP pressure of 7 cm of water.  2.  Specific recommendations include the patient's choice of interface was  Wisp large size mask.  3.  The patient should be scheduled for followup in my clinic in 6-8 weeks and  recommended CPAP therapy for clinical evaluation with review of download.    Thanks to Dr. Colin Ina, for giving me this opportunity to participate in  the care of this pleasant lady.        Kathlyn Sacramento, M.D.      D: 10/10/2014 15:58:08  T: 10/10/2014 16:34:58  SC/nts  Job#: [id number]  Doc#: [id number]

## 2014-10-10 NOTE — Progress Notes (Signed)
Theresa DavenportSandra arrived to sleep center for PAP trial. Paper work and questions answered. R.[facility],RPSGT,RST

## 2014-10-10 NOTE — Patient Instructions (Signed)
Learning About a Facet Joint Injection  What is a facet joint injection?  A facet joint injection is a shot of medicine to help with pain from arthritis or other causes. The injection goes into your neck or back, depending on where your pain is.  Facet joints connect your vertebrae to each other. Problems in these joints can cause long-term (chronic) pain in the neck or back, or sometimes in the shoulders, arms, buttocks, or legs.  The injection contains a numbing medicine, which works right away for a short time. After the numbing medicine works, the doctor usually will add a steroid medicine to the injection. Steroids reduce swelling and pain, but they don't always work.  How is a facet joint injection done?  You may get medicine to help you relax. The doctor will use a tiny needle to numb the skin in the area where you are getting the facet joint injection.  After the skin is numb, your doctor will use a larger needle for the actual facet joint injection. He or she will use X-rays to help guide the needle into the facet joint. You may feel some pressure. But you should not feel pain.  The procedure takes 10 to 30 minutes.  You will probably go home about an hour after your injection.  What can you expect after a facet joint injection?  You may have numbness for a few hours. The numbness could be in your neck or back, or your arm or leg, depending on where you got the shot.  You will need someone to drive you home.  Your pain may be gone right away. But it may return after a few hours or days. This is because the steroid medicine has not started to work yet.  Steroids don't always work. And when they do, it takes a few days. But when they work, the pain relief can last for several days to a few months or longer.  You may want to do less than normal for a few days. But you may also be able to return to your daily routine. It's usually best to increase your activities slowly over time. Follow your doctor's  instructions carefully.  Follow-up care is a key part of your treatment and safety. Be sure to make and go to all appointments, and call your doctor if you are having problems. It's also a good idea to know your test results and keep a list of the medicines you take.   Where can you learn more?   Go to [email or web address] and sign in to your MyChart account. Enter B481 in the Search Health Information box to learn more about ???Learning About a Facet Joint Injection.???    If you do not have an account, please click on the ???Sign Up Now??? link.   ?? 2006-2016 [employer]. Care instructions adapted under license by [facility]. This care instruction is for use with your licensed healthcare professional. If you have questions about a medical condition or this instruction, always ask your healthcare professional. [employer] disclaims any warranty or liability for your use of this information.  Content Version: 10.8.513193; Current as of: Jun 22, 2013        Facet Joint Injection: What to Expect at Home  Your Recovery  A facet joint injection is a shot of medicine to help with pain from arthritis. The injection goes into your neck or back. Where you get your shot depends on where your pain is.  Facet joints connect   your vertebrae to each other along the back of your spine. Problems in these joints can cause long-term (chronic) pain in the neck or back. Sometimes the pain is in the shoulders, arms, buttocks, or legs.  You may have numbness for a few hours. The numbness could be in your neck, back, arm or leg. It depends on which facet joint was treated.  You may feel pain relief right away. Sometimes the pain returns after the numbing medicine wears off. Your shot may also have included a steroid. Steroids take a few days to work. And they don't work for everyone.  This care sheet gives you a general idea about how long it will take for you to recover. But each person recovers at  a different pace. Follow the steps below to feel better as quickly as possible.  How can you care for yourself at home?  Activity  ?? You may want to do less than normal for a few days. But you may also be able to return to your daily routine.  ?? You may shower if your doctor okays it. Do not take a bath for the first 24 hours, or until your doctor tells you it is okay.  Diet  ?? You can eat your normal diet.  Medicines  ?? Be safe with medicines. Read and follow all instructions on the label.  ?? If the doctor gave you a prescription medicine for pain, take it as prescribed.  ?? If you are not taking a prescription pain medicine, ask your doctor if you can take an over-the-counter medicine.  Ice   ?? If the site of your shot feels sore or tender, put ice or a cold pack on it for 10 to 20 minutes at a time. Put a thin cloth between the ice and your skin.  Follow-up care is a key part of your treatment and safety. Be sure to make and go to all appointments, and call your doctor if you are having problems. It's also a good idea to know your test results and keep a list of the medicines you take.  When should you call for help?  Call 911 anytime you think you may need emergency care. For example, call if:  ?? You are unable to move an arm or a leg at all.  ?? You have trouble breathing.  ?? You passed out (lost consciousness).  Call your doctor now or seek immediate medical care if:  ?? You have new or worse symptoms in your arms, legs, chest, belly, or buttocks. Symptoms may include:  ?? Numbness or tingling.  ?? Weakness.  ?? Pain.  ?? You lose bladder or bowel control.  ?? You have signs of infection, such as:  ?? Increased pain, swelling, warmth, or redness.  ?? A fever.  Watch closely for changes in your health, and be sure to contact your doctor if:  ?? You are not getting better as expected.   Where can you learn more?   Go to [email or web address] and sign in to your MyChart account. Enter X913 in the Search  Health Information box to learn more about ???Facet Joint Injection: What to Expect at Home.???    If you do not have an account, please click on the ???Sign Up Now??? link.   ?? 2006-2016 [employer]. Care instructions adapted under license by [facility]. This care instruction is for use with your licensed healthcare professional. If you have questions about a medical condition or this instruction, always   ask your healthcare professional. [employer] disclaims any warranty or liability for your use of this information.  Content Version: 10.8.513193; Current as of: September 20, 2013        Learning About Medial Branch Block and Neurotomy  What are medial branch block and neurotomy?     Facet joints connect your vertebrae to each other. Problems in these joints can cause chronic (long-term) pain in the neck or back. They can sometimes affect the shoulders, arms, buttocks, or legs.  Medial branch nerves are the nerves that carry many of the pain messages from your facet joints.  Radiofrequency medial branch neurotomy is a type of medial branch neurotomy that is used to relieve arthritis pain. It uses radio waves to damage nerves in your neck or back so that they can no longer send pain messages to your brain.  Before your doctor knows if a neurotomy will help you, he or she will do a medial branch block to find out if certain nerves are the ones that are a source of your pain. You will need two separate visits to the outpatient center or hospital to have both procedures.  How is a medial branch block done?  The doctor will use a tiny needle to numb the skin where you will get the block. Then he or she puts the block needle into the numbed area. You may feel some pressure, but you should not feel pain. Using fluoroscopy (live X-ray) to guide the needle, the doctor injects medicine onto one or more nerves to make them numb.  If you get relief from your pain in the next 4 to 6 hours, it's a sign that  those nerves may be contributing to your pain. The relief will last only a short time. You may then have a medial branch neurotomy at a later visit to try to get longer relief.  It takes 20 to 30 minutes to get the block. You can go home after the doctor watches you for about an hour. You will get instructions on how to report how much pain you have when you are at home.  You will need someone to drive you home.  How is medial branch neurotomy done?  The doctor will use a tiny needle to numb the skin where you will get the neurotomy. Then he or she puts the neurotomy needle into the numbed area. You may feel some pressure. Using fluoroscopy (live X-ray) to guide the needle, the doctor sends radio waves through the needle to the nerve for 60 to 90 seconds. The radio waves heat the nerve, which damages it. The doctor may do this several times. And he or she may treat more than one nerve.  It takes 45 to 90 minutes to get a neurotomy, depending on how many nerves are heated. You will probably go home 30 to 60 minutes later.  You will need someone to drive you home.  What can you expect after a neurotomy?  You may feel a little sore or tender at the injection site at first. But after a successful neurotomy, most people have pain relief right away. It often lasts for 9 to 12 months or longer. Sometimes the pain relief is permanent.  If your pain does come back, it may mean that the damaged nerve has healed and can send pain messages again. Or it can mean that a different nerve is causing pain. Your doctor will discuss your options with you.  Follow-up care is a key  part of your treatment and safety. Be sure to make and go to all appointments, and call your doctor if you are having problems. It's also a good idea to know your test results and keep a list of the medicines you take.   Where can you learn more?   Go to [email or web address] and sign in to your MyChart account. Enter [phone] in the Search  Health Information box to learn more about ???Learning About Medial Branch Block and Neurotomy.???    If you do not have an account, please click on the ???Sign Up Now??? link.   ?? 2006-2016 [employer]. Care instructions adapted under license by [facility]. This care instruction is for use with your licensed healthcare professional. If you have questions about a medical condition or this instruction, always ask your healthcare professional. [employer] disclaims any warranty or liability for your use of this information.  Content Version: 10.8.513193; Current as of: May 25, 2013

## 2014-10-10 NOTE — Progress Notes (Signed)
[facility]  [facility]  [address]  [address]  [place] [address]  Dept: [phone]  Dept Fax: [phone]  Loc: [phone]    Visit Date: 10/10/2014    Theresa Vargas is a 59 y.o. female who is referred for pain management evaluation and treatment per Dr. Barry Dienes.                CAGE and CAGE-AID Questions   1. In the last three months, have you felt you should cut down or stop drinking or using drugs?  Yes         No      2. In the last three months, has anyone annoyed you or gotten on your nerves by telling you to cut down or stop drinking or using drugs?  Yes         No      3. In the last three months, have you felt guilty or bad about how much you drink or use drugs?   Yes         No      4. In the last three months, have you been waking up wanting to have an alcoholic drink or use drugs?  Yes         No         Opioid Risk Tool:  Clinician Form       1. Family History of Substance Abuse: Female Female    Alcohol   1   3    Illegal drugs   2   3    Prescription drugs     4   4   2. Personal History of Substance Abuse:          Alcohol   3   3    Illegal drugs   4   4    Prescription drugs     5   5   3. Age (mark box if between 70 and 7):     1   1   4. History of Preadolescent Sexual Abuse:     3   0   5. 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 Pain is mostly on left side of low and radiates dow right posterior hip around knee and down to heal all left leg. Pain has been gradually  decreasing. ) problem. The current episode started more than 1 year ago. The problem occurs constantly. The problem has been gradually worsening since onset. The pain is present in the lumbar spine. The quality of the pain is described as aching and shooting. The pain radiates to the left thigh, left knee and left foot. The pain is at a severity of 8/10. The pain is severe. The pain is worse during the night.  The symptoms are aggravated by twisting, standing and sitting (walking, treadmill, ). Stiffness is present in the morning. Associated symptoms include weakness (after about 8 hrs of being at work ). Pertinent negatives include no abdominal pain, bladder incontinence, bowel incontinence, chest pain, fever, headaches, numbness, paresis, paresthesias or tingling. She has tried NSAIDs, walking, home exercises, ice, muscle relaxant and analgesics (had LESI in past in 2014 which helped for about 6 months ) for the symptoms. The treatment provided moderate (Currently taking Norco 5/325 mg tabs, 1 tab BID prn, states iit decreases pain to 4/10 pain never goes away. ) relief.   Neck Pain    This is a chronic (Pain started was diagnosed with stenosis in early 2000's pain started in 2007 and had cervical ACDF in 2008 with good relief of pain,. In last year pain started to increased in is posterior neck with radiation down left arm to fingers ) problem. The current episode started more than 1 year ago. The problem occurs intermittently. The problem has been gradually worsening. The pain is associated with nothing. The pain is present in the midline and occipital region. The quality of the pain is described as stabbing (left arm heavy ). The pain is at a severity of 5/10. The pain is moderate. Exacerbated by: activity, and work  The pain is worse during the night. Stiffness is present all day. Associated symptoms include weakness (after about 8 hrs of being at work ). Pertinent negatives include no chest pain, fever,  headaches, numbness, paresis, photophobia or tingling. She has tried NSAIDs, ice, home exercises and oral narcotics for the symptoms. The treatment provided significant (Norco takes pain away. Scheduled to see Dr. Karie Mainland for EGD ) relief.     ??  Diagnostics:  MRI lumbar 07/2012:   At L4-5, there are moderate facet degenerative changes. ??There is loss of   ???? ?? disc height. ??There is mild spinal canal stenosis. ??There is stenosis of   ???? ?? both lateral recesses. ??There is mild left foraminal stenosis. ??The   ???? ?? previously visualized focal disc protrusion at this level is no longer   ???? ?? present.   ??  At L5-S1, there are facet degenerative changes. ??These are mild. ??There is   ???? ?? loss of disc height. ??There is no spinal canal stenosis. ??There is mild   ???? ?? bilateral foraminal stenosis.     CT Cervical spine reviewed 07/19/2013  No acute fracture or subluxation. Previous ACDF C5-7. Central osteophyte at C6 narrows the central canal to 9.7 cm in the midline. There is bilateral neural foraminal narrowing at C6-7 appears be a right lateral disc bulging at C6-7. No    precervical soft tissue swelling is seen. Paranasal sinuses show mucus retention cyst in the left maxillary sinus.   ??   ??   Impression   IMPRESSION: ??Postsurgical changes with chronic degenerative change at C6-7. No acute findings.         Patient states symptoms interfere with:  A.  General Activity:  no   B.  Mood: yes    C.  Walking Ability:   no   D.  Normal Work (Includes both work outside the home and housework):   no    E.  Relations with Other People:  no   F.  Sleep:   no   G. Enjoyment of Life:  no     The patient is allergic to anesthetic ether.    Past Medical History  Theresa Vargas  has a past medical history of Diabetes mellitus (HCC); Hypertension; Nausea & vomiting; and Psychiatric problem.    Past Surgical History  The patient  has a past surgical history that includes Cholecystectomy (2007); Hysterectomy (2007); Neck surgery (2008 [facility]); Tympanostomy tube placement (1610,9604); skin biopsy (2007); Colonoscopy; Hemorrhoid surgery (05/2010); and other surgical history (11/05/10).    Family History  This patient's family history includes Diabetes in an other family member; Heart Disease in an other family member; Hypertension in an other family member; Mental Illness in an other family member; Other in an other family member.    Social History  Luverne  reports that she has never smoked. She has never used smokeless tobacco. She reports that she does not drink alcohol or use illicit drugs.    Medications    Current Outpatient Prescriptions:   ???  HYDROcodone-acetaminophen (NORCO) 5-325 MG per tablet, Take by mouth 2 times daily as needed, Disp: , Rfl:   ???  SEROQUEL XR 50 MG XR tablet, Take 100 mg by mouth daily, Disp: , Rfl:   ???  hydrochlorothiazide (HYDRODIURIL) 25 MG tablet, Take 1 tablet by mouth daily, Disp: 30 tablet, Rfl: 3  ???  diltiazem (CARDIZEM CD) 240 MG ER capsule, Take 1 capsule by mouth daily, Disp: 30 capsule, Rfl: 3  ???  ibuprofen (ADVIL;MOTRIN) 800 MG tablet, Take 800 mg by mouth every 6 hours as needed for Pain, Disp: , Rfl:   ???  VORTIoxetine (BRINTELLIX) 10 MG TABS tablet, Take 10 mg by mouth daily, Disp: , Rfl:   ???  PRISTIQ 100 MG TB24, Take by mouth daily , Disp: , Rfl:   ???  metformin (GLUCOPHAGE) 1000 MG tablet, Take 1,000 mg by mouth Daily with supper., Disp: , Rfl:   ???  metformin (GLUCOPHAGE) 500 MG tablet, Take 500 mg by mouth daily (with breakfast)., Disp: , Rfl:   ???  zolpidem (AMBIEN) 10 MG tablet, Take 10 mg by mouth nightly Takes every night, Disp: , Rfl:   ???  lorazepam (ATIVAN) 1 MG tablet, Take 1 mg by mouth every 6 hours as needed.  , Disp: , Rfl:   ???  cyclobenzaprine (FLEXERIL) 10 MG tablet, Take 10 mg by mouth nightly Indications: as needed, Disp: , Rfl:     Subjective:      Review of Systems   Constitutional: Negative for activity change, appetite change, chills, diaphoresis, fatigue, fever and unexpected weight  change.   HENT: Negative for congestion, ear pain, hearing loss, mouth sores, nosebleeds, rhinorrhea, sinus pressure and sore throat.    Eyes: Negative for photophobia, pain and visual disturbance.   Respiratory: Positive for apnea. Negative for cough, chest tightness, shortness of breath and wheezing.         Recently fitted for CPAP   Cardiovascular: Negative.  Negative for chest pain and palpitations.   Gastrointestinal: Negative for abdominal pain, bowel incontinence, constipation, diarrhea, nausea and vomiting.   Endocrine: Negative for cold intolerance, heat intolerance, polydipsia, polyphagia and polyuria.   Genitourinary: Negative for bladder incontinence, decreased urine volume, difficulty urinating, frequency and hematuria.   Musculoskeletal: Positive for back pain (lower back), neck pain and neck stiffness. Negative for arthralgias, gait problem, joint swelling and myalgias.   Skin: Negative for color change and rash.   Allergic/Immunologic: Negative for food allergies and immunocompromised state.   Neurological: Positive for weakness (after about 8 hrs of being at work ). Negative for dizziness, tingling, tremors, seizures, syncope, facial asymmetry, speech  difficulty, light-headedness, numbness, headaches and paresthesias.   Hematological: Does not bruise/bleed easily.   Psychiatric/Behavioral: Positive for sleep disturbance. Negative for agitation, behavioral problems, confusion, decreased concentration, dysphoric mood, hallucinations, self-injury and suicidal ideas. The patient is not nervous/anxious and is not hyperactive.         Insomnia, takes Ambien        Objective:     Vitals:    10/10/14 1033   BP: (!) 135/92   Site: Left Arm   Position: Sitting   Cuff Size: Small Adult   Pulse: 98   Weight: 180 lb (81.6 kg)   Height: 5\' 6"  (1.676 m)       Physical Exam   Constitutional: She is oriented to person, place, and time. She appears well-developed and well-nourished. No distress.   HENT:   Head:  Normocephalic and atraumatic.   Right Ear: External ear normal.   Left Ear: External ear normal.   Nose: Nose normal.   Mouth/Throat: Oropharynx is clear and moist.   Eyes: Conjunctivae and EOM are normal. Pupils are equal, round, and reactive to light.   Neck: Neck supple. No thyromegaly present.   Decreased range of motion with ear to shoulder both sides, tender posterior over bone    Cardiovascular: Normal rate, regular rhythm and intact distal pulses.  Exam reveals no gallop and no friction rub.    No murmur heard.  Pulmonary/Chest: Effort normal and breath sounds normal. No respiratory distress.   Abdominal: Soft. Bowel sounds are normal.   Musculoskeletal: She exhibits tenderness. She exhibits no edema or deformity.        Right shoulder: She exhibits normal range of motion and no tenderness.        Left shoulder: She exhibits normal range of motion and no tenderness.        Left elbow: She exhibits normal range of motion.        Right knee: She exhibits normal range of motion. No tenderness found.        Left knee: She exhibits normal range of motion. No tenderness found.        Cervical back: She exhibits tenderness.        Thoracic back: She exhibits normal range of motion and no tenderness.        Lumbar back: She exhibits decreased range of motion, tenderness and bony tenderness.        Left upper arm: She exhibits no tenderness and no bony tenderness.        Left forearm: She exhibits no tenderness and no bony tenderness.        Arms:  Right SLR + at 90 degrees, Left SLR negative    Neurological: She is alert and oriented to person, place, and time. She has normal reflexes. She displays no atrophy. No sensory deficit. She exhibits normal muscle tone. Coordination abnormal. Gait normal.   Reflex Scores:       Tricep reflexes are 2+ on the right side and 2+ on the left side.       Bicep reflexes are 2+ on the right side and 2+ on the left side.       Brachioradialis reflexes are 2+ on the right side and 2+  on the left side.       Patellar reflexes are 2+ on the right side and 2+ on the left side.       Achilles reflexes are 2+ on the right side and 2+ on the left side.  Strength left upper extremity 4/5, bilateral lower and right upper extremity 5/5.        Skin: Skin is warm and dry. No rash noted. No erythema.        Psychiatric: She has a normal mood and affect. Her behavior is normal. Thought content normal.        Assessment:     1. Spondylosis of lumbar region without myelopathy or radiculopathy    2. Osteoarthritis of lumbar spine, unspecified spinal osteoarthritis complication status    3. Left cervical radiculopathy        Plan:      ?? Patient read and signed orientation and opioid agreement.  ?? OARRS reviewed.  ?? Discussed long term side effects of medications.  ?? Discussed tolerance, dependency and addiction..UDS preformed today.  ?? Patient told can not receive any pain medications from any other source.  ?? Discussed with pt.may not be pain free.  ?? No evidence of abuse, diversion or aberrant behavior.  ?? Medications and/or procedures improve function and quality of life.  ?? Reviewed Dr. Barry Dienes referral and notes in great detail  ?? Reviewed MRI of Lumbar spine and CT cervical spine   ?? Plan L-facet MBB at L4-5, L5-S1 bilaterally for diagnostic and therapeutic treatment . Procedure and risks discussed in detail with patient with plan for RFA for long term relief if effective  ?? Continue CUrrent medications of prn Norco and Flexeril prescribed by Dr. Seleta Rhymes   ?? Await UDS    Previous treatments tried:  ?? PT: Yes,  any benefit? No, how many weeks? 3 months   ?? NSAIDs: Yes,  any benefit? Yes  ?? Chiropractic: No  ?? Muscle relaxants: Yes,  any benefit? Yes  ?? Narcotics: Yes,  any benefit? Yes  ?? Spine surgeon consult: No, for neck not low back     Meds. Prescribed:   No orders of the defined types were placed in this encounter.      Return for L-facet MBB at L4-5, L5-S1 bilaterally, follow up after procedure.        Time spent with patient was 60 minutes more than 50% was spent  Counseling/coordinated the patient's care.    Electronically signed by Halina Andreas, MD on 10/17/2014 at 1:21 PM

## 2014-10-11 NOTE — Telephone Encounter (Signed)
LM for Suleima to call us with her DME provider choice  Felipa Furnace, CMA

## 2014-10-14 ENCOUNTER — Encounter: Attending: Physical Medicine & Rehabilitation

## 2014-10-14 NOTE — Telephone Encounter (Signed)
Pt was called but had to leave message to please call in and let us know the DME she picked so we can send the order. RW

## 2014-10-15 NOTE — Telephone Encounter (Signed)
Faxed CPAP Setup Order to [facility] per [facility]  Felipa Furnace, CMA

## 2014-10-21 NOTE — Telephone Encounter (Signed)
[name] calls stating she had an appointment on 9/9 and left with papers stating she would be scheduled for facet injections. She did not hear from anyone and has since left a voicemail as well. She is calling again to find out if that is scheduled or if someone could please call her to at least know where she stands. Thank you!

## 2014-10-22 ENCOUNTER — Ambulatory Visit: Admit: 2014-10-22 | Discharge: 2014-10-22 | Payer: PRIVATE HEALTH INSURANCE | Attending: Neurology

## 2014-10-22 DIAGNOSIS — G5602 Carpal tunnel syndrome, left upper limb: Secondary | ICD-10-CM

## 2014-10-28 NOTE — Patient Instructions (Signed)
NPO after midnight  Bring insurance info and drivers license  Wear comfortable clean clothing  Do not bring jewelry or valuables  Shower night before and morning of surgery with a liquid antibacterial soap  Bring list of medications with dosage and how often taken  Follow all instructions give by your physician  Bring a driver

## 2014-10-29 ENCOUNTER — Ambulatory Visit: Admit: 2014-10-29

## 2014-10-29 ENCOUNTER — Inpatient Hospital Stay: Admit: 2014-10-29 | Attending: Pain Medicine

## 2014-10-29 LAB — POCT GLUCOSE: POC Glucose: 148 mg/dl — ABNORMAL HIGH (ref 70–108)

## 2014-10-29 MED ORDER — HYDROMORPHONE HCL 1 MG/ML IJ SOLN
1 MG/ML | INTRAMUSCULAR | Status: DC | PRN
Start: 2014-10-29 — End: 2014-10-30

## 2014-10-29 MED ORDER — DIPHENHYDRAMINE HCL 50 MG/ML IJ SOLN
50 MG/ML | Freq: Once | INTRAMUSCULAR | Status: AC | PRN
Start: 2014-10-29 — End: 2014-10-29

## 2014-10-29 MED ORDER — FENTANYL CITRATE (PF) 100 MCG/2ML IJ SOLN
100 MCG/2ML | INTRAMUSCULAR | Status: DC | PRN
Start: 2014-10-29 — End: 2014-10-30

## 2014-10-29 MED ORDER — LABETALOL HCL 5 MG/ML IV SOLN
5 MG/ML | INTRAVENOUS | Status: DC | PRN
Start: 2014-10-29 — End: 2014-10-30

## 2014-10-29 MED ORDER — PROMETHAZINE HCL 25 MG/ML IJ SOLN
25 MG/ML | Freq: Once | INTRAMUSCULAR | Status: AC | PRN
Start: 2014-10-29 — End: 2014-10-29

## 2014-10-29 MED ORDER — PROPOFOL 200 MG/20ML IV EMUL
200 MG/20ML | INTRAVENOUS | Status: AC
Start: 2014-10-29 — End: ?

## 2014-10-29 MED ORDER — BETAMETHASONE SOD PHOS & ACET 6 (3-3) MG/ML IJ SUSP
6 (3-3) MG/ML | INTRAMUSCULAR | Status: AC
Start: 2014-10-29 — End: ?

## 2014-10-29 MED ORDER — LIDOCAINE HCL (CARDIAC) 20 MG/ML IV SOLN
20 MG/ML | INTRAVENOUS | Status: AC
Start: 2014-10-29 — End: ?

## 2014-10-29 MED ORDER — BUPIVACAINE HCL (PF) 0.25 % IJ SOLN
0.25 % | INTRAMUSCULAR | Status: AC
Start: 2014-10-29 — End: ?

## 2014-10-29 MED ORDER — MEPERIDINE HCL 25 MG/ML IJ SOLN
25 MG/ML | Freq: Once | INTRAMUSCULAR | Status: AC | PRN
Start: 2014-10-29 — End: 2014-10-29

## 2014-10-29 MED ORDER — LIDOCAINE HCL 1 % IJ SOLN
1 % | INTRAMUSCULAR | Status: AC
Start: 2014-10-29 — End: ?

## 2014-10-29 MED FILL — BETAMETHASONE SOD PHOS & ACET 6 (3-3) MG/ML IJ SUSP: 6 (3-3) MG/ML | INTRAMUSCULAR | Qty: 1

## 2014-10-29 MED FILL — LIDOCAINE HCL 1 % IJ SOLN: 1 % | INTRAMUSCULAR | Qty: 20

## 2014-10-29 MED FILL — LIDOCAINE HCL (CARDIAC) 20 MG/ML IV SOLN: 20 MG/ML | INTRAVENOUS | Qty: 5

## 2014-10-29 MED FILL — DIPRIVAN 200 MG/20ML IV EMUL: 200 MG/20ML | INTRAVENOUS | Qty: 20

## 2014-10-29 MED FILL — SENSORCAINE-MPF 0.25 % IJ SOLN: 0.25 % | INTRAMUSCULAR | Qty: 30

## 2014-10-29 NOTE — Progress Notes (Signed)
Resting quietly in recliner with feet elevated and call light in reach.

## 2014-10-29 NOTE — Anesthesia Post-Procedure Evaluation (Signed)
[facility]  POST-ANESTHESIA NOTE       Name:  ANMOL PASCHEN                                         Age:  59 y.o.  MRN:  [id number]      Last Vitals:    Visit Vitals   ??? BP 124/83   ??? Pulse 75   ??? Temp 97.8 ??F (36.6 ??C) (Temporal)   ??? Resp 16   ??? Ht  (1.676 m)   ??? Wt 178 lb 6.4 oz (80.9 kg)   ??? SpO2 95%   ??? BMI 28.79 kg/m2     Patient Vitals for the past 4 hrs:   BP Temp Temp src Pulse Resp SpO2 Height Weight   10/29/14 1224 124/83 - - 75 16 95 % - -   10/29/14 1124 135/87 97.8 ??F (36.6 ??C) Temporal - 16 94 %  (1.676 m) 178 lb 6.4 oz (80.9 kg)       Level of Consciousness:  Awake    Respiratory:  Stable    Oxygen Saturation:  Stable    Cardiovascular:  Stable    Hydration:  Adequate    PONV:  Stable    Post-op Pain:  Adequate analgesia    Post-op Assessment:  No apparent anesthetic complications    Additional Follow-Up / Treatment / Comment:  None    Graciella Belton. Daxtin Leiker, MD  October 29, 2014   1:22 PM

## 2014-10-29 NOTE — Op Note (Signed)
Pre-Procedure Note    Patient Name: Theresa Vargas   Date of Birth:[birth year]  Room/Bed: Room/bed info not found  Medical Record Number: [id number]  Date: 10/29/2014      Indication:  Lower back pain    Consent: On file.    Vital Signs:   Vitals:    10/29/14 1124   BP: 135/87   Resp: 16   Temp: 97.8 ??F (36.6 ??C)   SpO2: 94%       Pre-Sedation Documentation and Exam:   Vital signs have been reviewed (see flow sheet for vitals).    Sedation/ Anesthesia Plan:   MAC    Patient is an appropriate candidate for plan of sedation: yes    Preoperative Diagnosis:   L-spondylosis    Postoperative Diagnosis:   As above    Procedure Performed:  Diagnostic/Confirmatory median branch blocks at the levels of L4-5 and L5-S1 bilateral under fluoroscopic guidance #1    Indication for the Procedure:  The patient has a history of chronic low back pain that is not responding well to the conservative treatment.  The patient's pain is mostly axial in nature.  Pain is interfering with the activities of daily living.  Physical examination revealed facet tenderness and facet loading is positive. The procedure and risks  were discussed with the patient and an informed consent was obtained.    Procedure: Bilateral    Patient is placed in prone position and skin over  the back was prepped and draped in sterile manner. Then using fluoroscopy the junction of the transverse process of the vertebra with the superior process of the facet joint was observed and the view was optimized. The skin and deep tissues posterior were infiltrated with 8 ml of 1% lidocaine. Then a #22-gauge 3-1/2  inch spinal needle was introduced through the skin wheal under fluoroscopy guidance such that the tip of the needle lies at the junction of the transverse process with the superior processes of the facet joint.  Then after negative aspiration a total of 6 ml of 0.25% Marcaine and Celestone (total 12 mg) was injected through the needles.  This was done at the levels of  L4-5 and L5-S1 bilateral.    For L5 Median branch block the junction of the ala of  the sacrum with the superior articular process of the facet joint was taken as a reference point and L4 median branch the junction of the transverse process the L5 with the superolateral possible facet joint was taken to the point and healthy median branch the junction of the transverse process of L4 with the superior lateral process of the facet joint was taken as a reference point. For S1 median branch the most lateral and superior aspect of S1 foramina was taken as a reference point.    Patient's vital signs and neurological status remained stable through  the procedure and post procedural  Period.  Patient was instructed to keep track of pain for next 24 hours. every hour and bring it with next visit. Patient tollerated the procedure well and was discharged home in stable condition.

## 2014-10-29 NOTE — Anesthesia Pre-Procedure Evaluation (Signed)
Department of Anesthesiology  Preprocedure Note       Name:  Theresa Vargas   Age:  59 y.o.  DOB:  08-06-1955                                          MRN:  [id number]         Date:  10/29/2014      Surgeon:    Procedure:    Medications prior to admission:   Prior to Admission medications    Medication Sig Start Date End Date Taking? Authorizing Provider   HYDROcodone-acetaminophen (NORCO) 5-325 MG per tablet Take by mouth 2 times daily as needed 09/27/14   Historical Provider, MD   SEROQUEL XR 50 MG XR tablet Take 100 mg by mouth daily 07/07/14   Historical Provider, MD   hydrochlorothiazide (HYDRODIURIL) 25 MG tablet Take 1 tablet by mouth daily 08/19/14   Modena Nunnery, DO   diltiazem (CARDIZEM CD) 240 MG ER capsule Take 1 capsule by mouth daily 08/19/14   Modena Nunnery, DO   cyclobenzaprine (FLEXERIL) 10 MG tablet Take 10 mg by mouth nightly Indications: as needed    Historical Provider, MD   ibuprofen (ADVIL;MOTRIN) 800 MG tablet Take 800 mg by mouth every 6 hours as needed for Pain    Historical Provider, MD   VORTIoxetine (BRINTELLIX) 10 MG TABS tablet Take 10 mg by mouth daily    Historical Provider, MD   PRISTIQ 100 MG TB24 Take by mouth daily  04/12/14   Historical Provider, MD   metformin (GLUCOPHAGE) 1000 MG tablet Take 1,000 mg by mouth Daily with supper.    Historical Provider, MD   metformin (GLUCOPHAGE) 500 MG tablet Take 500 mg by mouth daily (with breakfast).    Historical Provider, MD   zolpidem (AMBIEN) 10 MG tablet Take 10 mg by mouth nightly Takes every night    Historical Provider, MD   lorazepam (ATIVAN) 1 MG tablet Take 1 mg by mouth every 6 hours as needed.      Historical Provider, MD       Current medications:    Current Outpatient Prescriptions   Medication Sig Dispense Refill   ??? HYDROcodone-acetaminophen (NORCO) 5-325 MG per tablet Take by mouth 2 times daily as needed     ??? SEROQUEL XR 50 MG XR tablet Take 100 mg by mouth daily     ??? hydrochlorothiazide (HYDRODIURIL) 25 MG tablet Take 1  tablet by mouth daily 30 tablet 3   ??? diltiazem (CARDIZEM CD) 240 MG ER capsule Take 1 capsule by mouth daily 30 capsule 3   ??? cyclobenzaprine (FLEXERIL) 10 MG tablet Take 10 mg by mouth nightly Indications: as needed     ??? ibuprofen (ADVIL;MOTRIN) 800 MG tablet Take 800 mg by mouth every 6 hours as needed for Pain     ??? VORTIoxetine (BRINTELLIX) 10 MG TABS tablet Take 10 mg by mouth daily     ??? PRISTIQ 100 MG TB24 Take by mouth daily      ??? metformin (GLUCOPHAGE) 1000 MG tablet Take 1,000 mg by mouth Daily with supper.     ??? metformin (GLUCOPHAGE) 500 MG tablet Take 500 mg by mouth daily (with breakfast).     ??? zolpidem (AMBIEN) 10 MG tablet Take 10 mg by mouth nightly Takes every night     ??? lorazepam (ATIVAN) 1  MG tablet Take 1 mg by mouth every 6 hours as needed.         Current Facility-Administered Medications   Medication Dose Route Frequency Provider Last Rate Last Dose   ??? fentaNYL (SUBLIMAZE) injection 25 mcg  25 mcg Intravenous Q5 Min PRN Louisa Second, MD       ??? HYDROmorphone (DILAUDID) injection 0.5 mg  0.5 mg Intravenous Q5 Min PRN Louisa Second, MD       ??? HYDROmorphone (DILAUDID) injection 0.25 mg  0.25 mg Intravenous Q5 Min PRN Louisa Second, MD       ??? fentaNYL (SUBLIMAZE) injection 50 mcg  50 mcg Intravenous Q5 Min PRN Louisa Second, MD       ??? diphenhydrAMINE (BENADRYL) injection 12.5 mg  12.5 mg Intravenous Once PRN Louisa Second, MD       ??? promethazine (PHENERGAN) injection 12.5 mg  12.5 mg Intramuscular Once PRN Louisa Second, MD       ??? labetalol (NORMODYNE;TRANDATE) injection 5 mg  5 mg Intravenous Q10 Min PRN Louisa Second, MD       ??? meperidine (DEMEROL) injection 25 mg  25 mg Intravenous Once PRN Louisa Second, MD           Allergies:    Allergies   Allergen Reactions   ??? Anesthetic Ether Nausea Only     Reaction to Local and general       Problem List:    Patient Active Problem List   Diagnosis Code   ??? OM (otitis media) H66.90   ??? ETD (eustachian tube dysfunction)  H69.80   ??? Dizziness and giddiness R42   ??? Anal skin tag K64.4   ??? Tinnitus of both ears H93.13   ??? Chronic sinusitis J32.9   ??? Acute URI J06.9   ??? Hypertrophy of nasal turbinates J34.3   ??? Nasal septal deviation J34.2   ??? Status post myringotomy with insertion of tube Z96.22   ??? Eustachian tube dysfunction H69.80   ??? Otitis media H66.90       Past Medical History:        Diagnosis Date   ??? Diabetes mellitus (HCC)    ??? Hypertension    ??? Nausea & vomiting    ??? Psychiatric problem        Past Surgical History:        Procedure Laterality Date   ??? Cholecystectomy  2007   ??? Hysterectomy  2007   ??? Neck surgery  2008 [place] [facility]     [facility]   ??? Tympanostomy tube placement  1610,9604   ??? Skin biopsy  2007     left cheek   ??? Colonoscopy       2006   ??? Hemorrhoid surgery  05/2010   ??? Other surgical history  11/05/10     hemmroidectomy       Social History:    Social History   Substance Use Topics   ??? Smoking status: Never Smoker   ??? Smokeless tobacco: Never Used   ??? Alcohol use No                                Counseling given: Not Answered      Vital Signs (Current): There were no vitals filed for this visit.  BP Readings from Last 3 Encounters:   10/10/14 (!) 135/92   10/10/14 130/80   10/02/14 136/82       NPO Status:                                                                                 BMI:   Wt Readings from Last 3 Encounters:   10/10/14 180 lb (81.6 kg)   10/10/14 182 lb (82.6 kg)   10/02/14 182 lb 9.6 oz (82.8 kg)     There is no height or weight on file to calculate BMI.    Anesthesia Evaluation  Patient summary reviewed and Nursing notes reviewed no history of anesthetic complications:   Airway: Mallampati: II  TM distance: >3 FB   Neck ROM: full  Mouth opening: > = 3 FB Dental: normal exam         Pulmonary:negative ROS          Cardiovascular:    (+) hypertension: moderate,     (-) past MI, CAD, dysrhythmias,  angina and  CHF          Beta Blocker:  Not on Beta  Blocker   Neuro/Psych:   (+) psychiatric history (Anxiety):   GI/Hepatic/Renal: neg ROS          Endo/Other: negative ROS         Abdominal:                    Anesthesia Plan    ASA 2     MAC     intravenous induction   Anesthetic plan and risks discussed with patient.    Plan discussed with CRNA.            Graciella Belton. Micholas Drumwright, MD   10/29/2014

## 2014-11-01 NOTE — Telephone Encounter (Signed)
Completed.

## 2014-11-06 ENCOUNTER — Encounter

## 2014-11-14 ENCOUNTER — Ambulatory Visit
Admit: 2014-11-14 | Discharge: 2014-11-14 | Payer: PRIVATE HEALTH INSURANCE | Attending: Physical Medicine & Rehabilitation

## 2014-11-14 DIAGNOSIS — M47816 Spondylosis without myelopathy or radiculopathy, lumbar region: Secondary | ICD-10-CM

## 2014-11-14 MED ORDER — GABAPENTIN 100 MG PO CAPS
100 MG | ORAL_CAPSULE | Freq: Two times a day (BID) | ORAL | 2 refills | Status: DC
Start: 2014-11-14 — End: 2015-03-24

## 2014-11-14 MED ORDER — HYDROCODONE-ACETAMINOPHEN 5-325 MG PO TABS
5-325 MG | ORAL_TABLET | Freq: Two times a day (BID) | ORAL | 0 refills | Status: DC | PRN
Start: 2014-11-14 — End: 2014-12-19

## 2014-11-14 NOTE — Progress Notes (Signed)
[facility]    Physical Medicine & Rehabilitation  Outpatient progress note    Chief Complaint:   Chief Complaint   Patient presents with   ??? Follow-up     Osteoarthritis of lumbar spine, unspecified spinal osteoarthritis complication status         Subjective: Theresa Vargas is a 59 y.o. female who returns to the office today for further follow up.  Lumbar procedures were beneficial.  She is taking Norco as needed for pain.  Would like our office to begin prescribing.  Discussed risks/benefits of opiate use.  Expressed that opiate use should not be long term strategy.      Patient with low back pain referring into left lower limb.  She has been through therapy, chiropractics, imaging, conservative medication management, and epidural steroidal injections. Transient benefit from any of the therapeutic activities mentioned above. Her last MRI was in 2014 and showed a previous disc extrusion on prior imaging that was improved, but there is considerable facet arthropathy in the lower lumbar region. Patient describes that she has continuous pain that worsens throughout the day. She works as a Engineer, civil (consulting) at [employer] and the workday significantly increases her pain. She is with considerable pain as well when she tries to become more physically active.   ??  Review of Systems:  Constitutional: Negative for chills, fever, malaise/fatigue and weight loss.   HENT: Negative for hearing loss.   Eyes: Negative for blurred vision and double vision.   Respiratory: Negative for cough and shortness of breath.   Cardiovascular: Negative for chest pain, palpitations, claudication and leg swelling.   Gastrointestinal: Negative for abdominal pain, constipation, diarrhea, heartburn, nausea and vomiting.   Genitourinary: Negative for dysuria, frequency and urgency.   Musculoskeletal: Positive for back pain and neck pain. Negative for joint pain and myalgias.   Skin: Negative for itching and rash.    Neurological: Negative for dizziness, tingling, tremors, sensory change, speech change, focal weakness, weakness and headaches.   Psychiatric/Behavioral: Negative for depression, hallucinations and memory loss. The patient is not nervous/anxious and does not have insomnia.     Physical Exam:  Visit Vitals   ??? BP (!) 148/97 (Site: Left Arm, Position: Sitting)   ??? Pulse 99   ??? Ht 5' 6.14" (1.68 m)   ??? Wt 186 lb (84.4 kg)   ??? BMI 29.89 kg/m2     Constitutional: She is oriented to person, place, and time. She appears well-developed and well-nourished.   Cardiovascular: Intact distal pulses.   Musculoskeletal: She exhibits no edema.   Cervical back: She exhibits decreased range of motion, tenderness and pain.   Lumbar back: She exhibits decreased range of motion and tenderness (worse with lumbar extension).   Back:      Neurological: She is alert and oriented to person, place, and time. She has normal strength and normal reflexes. She displays no tremor. No cranial nerve deficit or sensory deficit. She exhibits normal muscle tone. Coordination and gait normal.   Spurling negative bilateral.   Skin: Skin is warm and dry. No rash noted. No erythema.   Psychiatric: She has a normal mood and affect. Her speech is normal and behavior is normal. Judgment and thought content normal. Cognition and memory are normal.     Impression:  1. Lumbar pain  2. Lumbar spondylosis  3. Left lower limb radiculopathy  4. Left cervical radiculopathy s/p prior ACDF 2008    Plan:  5. Ongoing follow up  with pain management  6. Gabapentin to start low dose.  Monitor response for radiculopathy  7. Norco no more than twice daily as needed for pain  8. Continue to encourage physical activity and weight loss  9. Continue work without specific restrictions at this time  10. EMG/NCS report not available for review yet    Will continue to monitor any benefits vs side effects of the medications as prescribed.  The patient has been warned about the risk  of operating machinery including driving if impaired in any way by these medications.  The patient also accepts the risks of tolerance, dependency, or addiction related to the prescribed medications.  All questions were answered.  Reevaluation as planned, or sooner if requested.     Controlled Substances Monitoring: Attestation: The Prescription Monitoring Report for this patient was reviewed today. Garald Braver(Hutson Luft T Fiza Nation, MD)  Documentation: Possible medication side effects, risk of tolerance and/or dependence, and alternative treatments discussed, No signs of potential drug abuse or diversion identified., Medication contract signed today. Garald Braver(Casmer Yepiz T Tierre Gerard, MD)     Return in about 4 weeks (around 12/12/2014).     It was my pleasure to evaluate Kennedy BuckerSandra A Kunesh today.  Please call with any concerns or questions.  25 minutes spent in evaluation efforts    Garald BraverMatthew T Hanley Woerner, MD

## 2014-11-27 ENCOUNTER — Ambulatory Visit: Admit: 2014-11-27 | Discharge: 2014-11-27 | Payer: PRIVATE HEALTH INSURANCE | Attending: Pain Medicine

## 2014-11-27 DIAGNOSIS — M47816 Spondylosis without myelopathy or radiculopathy, lumbar region: Secondary | ICD-10-CM

## 2014-11-27 NOTE — Progress Notes (Signed)
[facility]  [facility]  [address]  [address]  [place] [address]  Dept: [phone]  Dept Fax: [phone]  Loc: [phone]    Visit Date: 11/27/2014    HPI:   Theresa Vargas is a 59 y.o. female is here today for:  Chief Complaint   Patient presents with   ??? Follow-up     post injection      Lower back pain  HPI   S/P Bilateral L-facet MBB on 10/29/14 helped 80%.  Medications reviewed. Patient takes Norco prescribed per Dr Barry Dienes.    Functionality Assessment/Goals Worksheet     On a scale of 0 (Does not Interfere) to 10 (Completely Interferes)     1.  Which number describes how during the past week pain has interfered with       the following:  A.  General Activity:  8  B.  Mood: 5  C.  Walking Ability:  9  D.  Normal Work (Includes both work outside the home and housework):  9   E.  Relations with Other People:   0  F.  Sleep:   6  G.  Enjoyment of Life:   3    2.  Patient Prefers to Take their Pain Medications:       On a regular basis     Only when necessary      Does not take pain medications    3.  What are the Patient's Goals/Expectations for Visiting Pain Management?       Learn about my pain      Receive Medication     Physical Therapy       Treat Depression     Receive Injections      Treat Sleep     Deal with Anxiety and Stress     Treat Opoid Dependence/Addiction     Other:      The patient has No Known Allergies.    Past Medical History  Theresa Vargas  has a past medical history of Diabetes mellitus (HCC); Hypertension; Nausea & vomiting; PONV (postoperative nausea and vomiting); and Psychiatric problem.    Past Surgical History  The patient  has a past surgical history that includes Cholecystectomy (2007); Hysterectomy (2007); Neck surgery (2008 [place] [facility]); Tympanostomy tube placement (9629,5284); skin biopsy (2007); Colonoscopy; Hemorrhoid surgery (05/2010); other surgical history (11/05/10); and Nerve Block  (10/29/2014).    Family History  This patient's family history includes Diabetes in an other family member; Heart Disease in an other family member; Hypertension in an other family member; Mental Illness in an other family member; Other in an other family member.    Social History  [name]  reports that she has never smoked. She has never used smokeless tobacco. She reports that she does not drink alcohol or use illicit drugs.    Medications    Current Outpatient Prescriptions:   ???  HYDROcodone-acetaminophen (NORCO) 5-325 MG per tablet, Take 1 tablet by mouth 2 times daily as needed for Pain, Disp: 60 tablet, Rfl: 0  ???  gabapentin (NEURONTIN) 100 MG capsule, Take 1 capsule by mouth 2 times daily, Disp: 60 capsule, Rfl: 2  ???  SEROQUEL XR 50 MG XR tablet, Take 100 mg by mouth daily, Disp: , Rfl:   ???  hydrochlorothiazide (HYDRODIURIL) 25 MG tablet, Take 1 tablet by mouth daily, Disp: 30 tablet, Rfl: 3  ???  diltiazem (CARDIZEM CD) 240 MG ER  capsule, Take 1 capsule by mouth daily, Disp: 30 capsule, Rfl: 3  ???  cyclobenzaprine (FLEXERIL) 10 MG tablet, Take 10 mg by mouth as needed Indications: as needed , Disp: , Rfl:   ???  ibuprofen (ADVIL;MOTRIN) 800 MG tablet, Take 800 mg by mouth every 6 hours as needed for Pain, Disp: , Rfl:   ???  VORTIoxetine (BRINTELLIX) 10 MG TABS tablet, Take 10 mg by mouth daily, Disp: , Rfl:   ???  PRISTIQ 100 MG TB24, Take by mouth daily , Disp: , Rfl:   ???  metformin (GLUCOPHAGE) 1000 MG tablet, Take 1,000 mg by mouth Daily with supper., Disp: , Rfl:   ???  metformin (GLUCOPHAGE) 500 MG tablet, Take 500 mg by mouth daily (with breakfast)., Disp: , Rfl:   ???  zolpidem (AMBIEN) 10 MG tablet, Take 10 mg by mouth nightly Takes every night, Disp: , Rfl:   ???  lorazepam (ATIVAN) 1 MG tablet, Take 1 mg by mouth every 6 hours as needed.  , Disp: , Rfl:     Subjective:      Review of Systems   Constitutional: Negative for activity change, appetite change, chills, diaphoresis, fatigue, fever and unexpected weight  change.   HENT: Negative for congestion, dental problem, drooling, ear discharge, ear pain, facial swelling, hearing loss, mouth sores, nosebleeds, postnasal drip, rhinorrhea, sinus pressure, sneezing, sore throat, tinnitus, trouble swallowing and voice change.    Eyes: Negative for photophobia, pain, discharge, redness, itching and visual disturbance.   Respiratory: Negative for apnea, cough, choking, chest tightness, shortness of breath, wheezing and stridor.    Cardiovascular: Negative for chest pain, palpitations and leg swelling.   Gastrointestinal: Negative for abdominal distention, abdominal pain, anal bleeding, blood in stool, constipation, diarrhea, nausea, rectal pain and vomiting.   Endocrine: Negative for cold intolerance, heat intolerance, polydipsia, polyphagia and polyuria.   Genitourinary: Negative for decreased urine volume, difficulty urinating, dyspareunia, dysuria, enuresis, flank pain, frequency, genital sores, hematuria, menstrual problem, pelvic pain, urgency, vaginal bleeding, vaginal discharge and vaginal pain.   Musculoskeletal: Positive for back pain. Negative for arthralgias, gait problem, joint swelling, myalgias, neck pain and neck stiffness.   Skin: Negative for color change, pallor, rash and wound.   Allergic/Immunologic: Negative for environmental allergies, food allergies and immunocompromised state.   Neurological: Negative for dizziness, tremors, seizures, syncope, facial asymmetry, speech difficulty, weakness, light-headedness, numbness and headaches.   Hematological: Negative for adenopathy. Does not bruise/bleed easily.   Psychiatric/Behavioral: Negative for agitation, behavioral problems, confusion, decreased concentration, dysphoric mood, hallucinations, self-injury, sleep disturbance and suicidal ideas. The patient is not nervous/anxious and is not hyperactive.        Objective:     Vitals:    11/27/14 1456   BP: (!) 149/92   Pulse: 95   Weight: 188 lb (85.3 kg)   Height: 5'  6.5" (1.689 m)       Physical Exam   Constitutional: She is oriented to person, place, and time. She appears well-developed and well-nourished. No distress.   HENT:   Head: Normocephalic and atraumatic.   Right Ear: External ear normal.   Left Ear: External ear normal.   Nose: Nose normal.   Mouth/Throat: Oropharynx is clear and moist.   Eyes: Conjunctivae and EOM are normal. Pupils are equal, round, and reactive to light.   Neck: Neck supple. No thyromegaly present.   Decreased range of motion with ear to shoulder both sides, tender posterior over bone    Cardiovascular: Normal rate,  regular rhythm and intact distal pulses.  Exam reveals no gallop and no friction rub.    No murmur heard.  Pulmonary/Chest: Effort normal and breath sounds normal. No respiratory distress.   Abdominal: Soft. Bowel sounds are normal.   Musculoskeletal: She exhibits tenderness. She exhibits no edema or deformity.        Right shoulder: She exhibits normal range of motion and no tenderness.        Left shoulder: She exhibits normal range of motion and no tenderness.        Left elbow: She exhibits normal range of motion.        Right knee: She exhibits normal range of motion. No tenderness found.        Left knee: She exhibits normal range of motion. No tenderness found.        Cervical back: She exhibits tenderness.        Thoracic back: She exhibits normal range of motion and no tenderness.        Lumbar back: She exhibits decreased range of motion, tenderness and bony tenderness.        Left upper arm: She exhibits no tenderness and no bony tenderness.        Left forearm: She exhibits no tenderness and no bony tenderness.        Arms:  Right SLR + at 90 degrees, Left SLR negative    Neurological: She is alert and oriented to person, place, and time. She has normal reflexes. She displays no atrophy. No sensory deficit. She exhibits normal muscle tone. Coordination abnormal. Gait normal.   Reflex Scores:       Tricep reflexes are 2+ on  the right side and 2+ on the left side.       Bicep reflexes are 2+ on the right side and 2+ on the left side.       Brachioradialis reflexes are 2+ on the right side and 2+ on the left side.       Patellar reflexes are 2+ on the right side and 2+ on the left side.       Achilles reflexes are 2+ on the right side and 2+ on the left side.  Strength left upper extremity 4/5, bilateral lower and right upper extremity 5/5.        Skin: Skin is warm and dry. No rash noted. No erythema.        Psychiatric: She has a normal mood and affect. Her behavior is normal. Thought content normal.        Assessment:     1. Spondylosis of lumbar region without myelopathy or radiculopathy        Plan:      ?? OARRS reviewed.  ?? Discussed long term side effects of medications.  ?? Discussed tolerance, dependency and addiction.  ?? Previous UDS reviewed.  ?? UDS preformed today for compliance.  ?? Patient told can not receive any pain medications from any other source.  ?? Discussed with pt.may not be pain free.  ?? No evidence of abuse, diversion or aberrant behavior.  ?? Medications and/or procedures improve function and quality of life..  ?? Patient had L-facet MBB # 1 with 80%. Proceed L-facet MBB #2.       Meds. Prescribed:   No orders of the defined types were placed in this encounter.      Return for Bilateral L-Facet MBB @ L4-5 and L5-S1 , F/U after procedure..Marland Kitchen  Time spent with patient: 15 mins    Electronically signed by Halina Andreas, MD on 11/27/2014 at 3:43 PM

## 2014-12-03 ENCOUNTER — Ambulatory Visit: Admit: 2014-12-03 | Discharge: 2014-12-03 | Payer: PRIVATE HEALTH INSURANCE | Attending: Cardiovascular Disease

## 2014-12-03 DIAGNOSIS — R002 Palpitations: Secondary | ICD-10-CM

## 2014-12-03 MED ORDER — DILTIAZEM HCL ER COATED BEADS 240 MG PO CP24
240 MG | ORAL_CAPSULE | Freq: Every day | ORAL | 3 refills | Status: DC
Start: 2014-12-03 — End: 2016-01-06

## 2014-12-03 MED ORDER — HYDROCHLOROTHIAZIDE 25 MG PO TABS
25 MG | ORAL_TABLET | Freq: Every day | ORAL | 3 refills | Status: DC
Start: 2014-12-03 — End: 2015-06-23

## 2014-12-03 NOTE — Progress Notes (Signed)
Theresa Vargas is a 59 y.o. female is here for palpitations and workup which was negative he has some breakthrough palpitations and needs  blood pressure control and she is on a Cardizem this could be increased to 360 mg if blood pressure remains difficult to control on 240 mg  aday  and nausea no fever and chills and no sob with and without activity. No evidence of swelling in legs no palpitations and no syncopal episodes and no dizziness ,no bleeding ,or urination  Problems ,no double visions ,no speech problems and no bowel problems or diarrhea and tolerating medications     Patient Active Problem List   Diagnosis   ??? OM (otitis media)   ??? ETD (eustachian tube dysfunction)   ??? Dizziness and giddiness   ??? Anal skin tag   ??? Tinnitus of both ears   ??? Chronic sinusitis   ??? Acute URI   ??? Hypertrophy of nasal turbinates   ??? Nasal septal deviation   ??? Status post myringotomy with insertion of tube   ??? Eustachian tube dysfunction   ??? Otitis media     Past Medical History   Diagnosis Date   ??? Diabetes mellitus (HCC)    ??? Hypertension    ??? Nausea & vomiting    ??? PONV (postoperative nausea and vomiting)    ??? Psychiatric problem      Social History   Substance Use Topics   ??? Smoking status: Never Smoker   ??? Smokeless tobacco: Never Used   ??? Alcohol use No     No Known Allergies  Current Outpatient Prescriptions   Medication Sig Dispense Refill   ??? HYDROcodone-acetaminophen (NORCO) 5-325 MG per tablet Take 1 tablet by mouth 2 times daily as needed for Pain 60 tablet 0   ??? gabapentin (NEURONTIN) 100 MG capsule Take 1 capsule by mouth 2 times daily 60 capsule 2   ??? SEROQUEL XR 50 MG XR tablet Take 100 mg by mouth daily     ??? hydrochlorothiazide (HYDRODIURIL) 25 MG tablet Take 1 tablet by mouth daily 30 tablet 3   ??? diltiazem (CARDIZEM CD) 240 MG ER capsule Take 1 capsule by mouth daily 30 capsule 3   ??? cyclobenzaprine (FLEXERIL) 10 MG tablet Take 10 mg by mouth as needed Indications: as needed      ??? ibuprofen (ADVIL;MOTRIN)  800 MG tablet Take 800 mg by mouth every 6 hours as needed for Pain     ??? VORTIoxetine (BRINTELLIX) 10 MG TABS tablet Take 10 mg by mouth daily     ??? PRISTIQ 100 MG TB24 Take by mouth daily      ??? metformin (GLUCOPHAGE) 1000 MG tablet Take 1,000 mg by mouth Daily with supper.     ??? metformin (GLUCOPHAGE) 500 MG tablet Take 500 mg by mouth daily (with breakfast).     ??? zolpidem (AMBIEN) 10 MG tablet Take 10 mg by mouth nightly Takes every night     ??? lorazepam (ATIVAN) 1 MG tablet Take 1 mg by mouth every 6 hours as needed.         No current facility-administered medications for this visit.        REVIEW OF SYSTEM  Constitutional  symptoms such as fever chills and malaise and weakness  Are negative   HE ENT negative  HEART all negative  LUNGS all negative   ABDOMEN all negative  GENITAL URINARY all within normal limits  NEUROLOGY all negative  NECK all negative  SKIN all negative  MUSCULOSKELETAL negative  HEMATOLOGICAL negative  PSYCHIATRIC  negative    Vitals:    12/03/14 1454   BP: 155/89   Pulse: 91   Weight: 185 lb (83.9 kg)   Height: 5\' 6"  (1.676 m)       EXAMINATION   Gen. Appearance is reflective of nutritional normal nutrition and well-groomed   Appears  stated age    Carotid the amplitude of  carotid artery  And up stroke are present or diminished and bruits are absent   Thyroid palpation appears to be  normal  HEENT  teeth appears to be symmetrical without poor dentition and gums  and palate appears to be healthy and  head is normal cephalic  Without signs of trauma and eyes are without trauma no conjunctiva and Iids retracting and not retracted ptosis and no ptosis and without  erythematous changes and  Nose is symmetrical  without drainage  and ears are  without tinnitus  and throat is clear   JUGULAR VEINS  are not elevated and tongue is mid line no masses presence and no cannon A waves present   LYMPHATICS are without adenopathies at least on exam   CHEST  No biventricular heaves and thrills and no  right ventricular heaves and examination without signs of trauma and lesions  HEART not distant and regular rate and rhythm without   gallop signs and and no murmurs and systolic crescendo  Decrescendo  normal s1 and s2 sounds and no s3 and s4 split   Point of maximum intensity of the heartbeat  is at or displaced from the fifth intercostal space  LUNGS no   presence of intercostal retractions and no  use of the accessory muscles with a diaphragmatic movement present and not present pectus and pigeon chest and without wheezes and rhonchi and non diminished in all posterior lungs  and without rales  ABDOMEN abdominal bruit not present  And  Not present morbid obesity and with no   anasarca and no  pickwickian syndrome non distended without organomegaly and no rebound tenderness and bowel sound are present  all quadrants   NEUROLOGY all the cranial nerves are intact and no motor deficits  and no sensory deficits  MUSCULAR SKELETAL without signs of trauma and kyphosis and scoliosis  INTEGUMENTARY without tenting and skin rashes indentation and  dryness  NECK without lymph adenopathy   NEUROPSYCHIATRIC has no anxiety, no mood swings and depression  VASCULAR EXAM for carotid ,radial femoral arteries are  steady  and not diminished   Extremities no evidence of peripheral edema  And pulses are as follows    Assessment and plans    1. Heart palpitations     2. Essential hypertension     Advised to have eyes ,feet,and also neuropathy screening for diabetes.    Patient is educated about risks in diabetes such as low total cholesterol below 200 mg Hgb AIC of less than 7 mg and HDL more than 45 mg and patient agrees    Advised patient about risk factor modifications including exercise 30 mins a day and blood pressure control and other pertinent cardiac risk factors and patient accepts to modify them.    also we reviewed with patient about the test result and echocardiogram and stress test that are favorable and with no  guarantees and if patient condition changes need to return for reevaluation    WE REVIEWED Her MEDICATIONS AND NO CHANGE WAS MADE TODAY AND PATIENT  IS TOLERATING  THE MEDICATIONS  WELL WITHOUT SIDE EFFECTS  AND HAS STABLE CAD AND NO  ACTIVE SYMPTOMS  .    Patient was advised to return in 6 to 10 months for follow up or sooner if there is any change in care.    Electronically signed by Modena Nunnery, DO on 12/03/2014 at 3:11 PM

## 2014-12-03 NOTE — Progress Notes (Signed)
Pt here for 3 mo f./u    Pt  Denies cp, dizziness,sob, swelling in legs and feet     C/o palpitations,

## 2014-12-19 ENCOUNTER — Ambulatory Visit
Admit: 2014-12-19 | Discharge: 2014-12-19 | Payer: PRIVATE HEALTH INSURANCE | Attending: Physical Medicine & Rehabilitation

## 2014-12-19 DIAGNOSIS — M47816 Spondylosis without myelopathy or radiculopathy, lumbar region: Secondary | ICD-10-CM

## 2014-12-19 MED ORDER — HYDROCODONE-ACETAMINOPHEN 5-325 MG PO TABS
5-325 MG | ORAL_TABLET | Freq: Two times a day (BID) | ORAL | 0 refills | Status: DC | PRN
Start: 2014-12-19 — End: 2015-03-24

## 2014-12-19 NOTE — Progress Notes (Signed)
[facility]    Physical Medicine & Rehabilitation  Outpatient progress note    Chief Complaint:   Chief Complaint   Patient presents with   ??? Follow-up      Spondylosis of lumbar region without myelopathy or radiculopathy         Subjective: Theresa Vargas is a 58 y.o. female who returns to the office today for further follow up.  Lumbar procedures were beneficial.  She is ready to schedule repeat MBB.  80% relief after first round.  She is taking Norco as needed for pain.  Feels gabapentin has been beneficial.      She recently lost her job.  She has gained some weight.  Feels related to inactivity.  Also discussed that gabapentin could be contributing.  Has started exercising again and though back hurts after workout she also feels better.     Patient with low back pain referring into left lower limb.  She has been through therapy, chiropractics, imaging, conservative medication management, and epidural steroidal injections. ??    Review of Systems:  Constitutional: Negative for chills, fever, malaise/fatigue and weight loss.   HENT: Negative for hearing loss.   Eyes: Negative for blurred vision and double vision.   Respiratory: Negative for cough and shortness of breath.   Cardiovascular: Negative for chest pain, palpitations, claudication and leg swelling.   Gastrointestinal: Negative for abdominal pain, constipation, diarrhea, heartburn, nausea and vomiting.   Genitourinary: Negative for dysuria, frequency and urgency.   Musculoskeletal: Positive for back pain and neck pain. Negative for joint pain and myalgias.   Skin: Negative for itching and rash.   Neurological: Negative for dizziness, tingling, tremors, sensory change, speech change, focal weakness, weakness and headaches.   Psychiatric/Behavioral: Negative for depression, hallucinations and memory loss. The patient is not nervous/anxious and does not have insomnia.     Physical Exam:  Visit Vitals   ??? BP (!) 142/94  (Site: Right Arm, Position: Sitting)   ??? Pulse 92   ??? Ht 5' 6.14" (1.68 m)   ??? Wt 190 lb 6.4 oz (86.4 kg)   ??? BMI 30.6 kg/m2     Constitutional: She is oriented to person, place, and time. She appears well-developed and well-nourished.   Cardiovascular: Intact distal pulses.   Musculoskeletal: She exhibits no edema.   Cervical back: She exhibits decreased range of motion, tenderness and pain.   Lumbar back: She exhibits decreased range of motion and tenderness (worse with lumbar extension). Improved compared to previous  Back:      Neurological: She is alert and oriented to person, place, and time. She has normal strength and normal reflexes. She displays no tremor. No cranial nerve deficit or sensory deficit. She exhibits normal muscle tone. Coordination and gait normal.   Spurling negative bilateral. SLR negative bilateral  Skin: Skin is warm and dry. No rash noted. No erythema.   Psychiatric: She has a normal mood and affect. Her speech is normal and behavior is normal. Judgment and thought content normal. Cognition and memory are normal.     Impression:  1. Lumbar pain  2. Lumbar spondylosis  3. Left lower limb radiculopathy  4. Left cervical radiculopathy s/p prior ACDF 2008    Plan:  5. Ongoing follow up with pain management, MBB planned 12/30/14.  6. Continue gabapentin.  Monitor therapeutic response and weight gain pattern.  7. Norco no more than twice daily as needed for pain  8. Continue to encourage  physical activity and weight loss    Will continue to monitor any benefits vs side effects of the medications as prescribed.  The patient has been warned about the risk of operating machinery including driving if impaired in any way by these medications.  The patient also accepts the risks of tolerance, dependency, or addiction related to the prescribed medications.  All questions were answered.  Reevaluation as planned, or sooner if requested.     Controlled Substances Monitoring: Attestation: The  Prescription Monitoring Report for this patient was reviewed today. Garald Braver(Retta Pitcher T Kharizma Lesnick, MD)  Documentation: Possible medication side effects, risk of tolerance and/or dependence, and alternative treatments discussed, No signs of potential drug abuse or diversion identified. Garald Braver(Fatisha Rabalais T Marquavious Nazar, MD)     Return in about 3 months (around 03/21/2015).     It was my pleasure to evaluate Theresa Vargas today.  Please call with any concerns or questions.  15 minutes spent in evaluation efforts    Garald BraverMatthew T Neeva Trew, MD

## 2014-12-21 LAB — BASIC METABOLIC PANEL
Anion Gap: 13
BUN: 12 mg/dl (ref 10–20)
CO2: 31 mmol/L (ref 21–32)
Calcium: 9.4 mg/dl (ref 8.7–10.8)
Chloride: 100 mmol/L (ref 95–111)
Creatinine: 0.9 mg/dl (ref 0.5–1.3)
EGFR IF NonAfrican American: 65 (ref >60)
Glucose: 130 mg/dl — ABNORMAL HIGH (ref 70–100)
Potassium: 3.5 mmol/L (ref 3.5–5.4)
Sodium: 140 mmol/L (ref 134–147)
eGFR African American: 78 (ref >60)

## 2014-12-21 LAB — ALT/SGPT ALANINE TRANSAMINASE: ALT: 26 IU/L (ref 5–59)

## 2014-12-24 NOTE — H&P (Signed)
Theresa Vargas is an 59 y.o. female.    Chief Complaint:    Lower back pain    ??  Functionality Assessment/Goals Worksheet   ??  On a scale of 0 (Does not Interfere) to 10 (Completely Interferes)   ??  1. Which number describes how during the past week pain has interfered with the following:  A. General Activity:  8  B. Mood: 5  C. Walking Ability:  9  D. Normal Work (Includes both work outside the home and housework): 9   E. Relations with Other People:  0  F. Sleep:  6  G.  Enjoyment of Life:  3  ??  2. Patient Prefers to Take their Pain Medications:   []  On a regular basis  [x]  Only when necessary   []  Does not take pain medications  ??  3. What are the Patient's Goals/Expectations for Visiting Pain Management?   []  Learn about my pain    [x]  Receive Medication  []  Physical Therapy     []  Treat Depression  []  Receive Injections    []  Treat Sleep  []  Deal with Anxiety and Stress   []  Treat Opoid Dependence/Addiction  []  Other:  ??  ??  The patient has No Known Allergies.  ??  Past Medical History  Theresa Vargas  has a past medical history of Diabetes mellitus (HCC); Hypertension; Nausea & vomiting; PONV (postoperative nausea and vomiting); and Psychiatric problem.  ??  Past Surgical History  The patient  has a past surgical history that includes Cholecystectomy (2007); Hysterectomy (2007); Neck surgery (2008  [facility]); Tympanostomy tube placement ([id number](2006,2011); skin biopsy (2007); Colonoscopy; Hemorrhoid surgery (05/2010); other surgical history (11/05/10); and Nerve Block (10/29/2014).  ??  Family History  This patient's family history includes Diabetes in an other family member; Heart Disease in an other family member; Hypertension in an other family member; Mental Illness in an other family member; Other in an other family member.  ??  Social History  Theresa Vargas  reports that she has never smoked. She has never used smokeless tobacco. She reports that she does not drink alcohol or use illicit drugs.  ??  Medications      Current??Medication    ??  Current Outpatient Prescriptions:   ??? HYDROcodone-acetaminophen (NORCO) 5-325 MG per tablet, Take 1 tablet by mouth 2 times daily as needed for Pain, Disp: 60 tablet, Rfl: 0  ??? gabapentin (NEURONTIN) 100 MG capsule, Take 1 capsule by mouth 2 times daily, Disp: 60 capsule, Rfl: 2  ??? SEROQUEL XR 50 MG XR tablet, Take 100 mg by mouth daily, Disp: , Rfl:   ??? hydrochlorothiazide (HYDRODIURIL) 25 MG tablet, Take 1 tablet by mouth daily, Disp: 30 tablet, Rfl: 3  ??? diltiazem (CARDIZEM CD) 240 MG ER capsule, Take 1 capsule by mouth daily, Disp: 30 capsule, Rfl: 3  ??? cyclobenzaprine (FLEXERIL) 10 MG tablet, Take 10 mg by mouth as needed Indications: as needed , Disp: , Rfl:   ??? ibuprofen (ADVIL;MOTRIN) 800 MG tablet, Take 800 mg by mouth every 6 hours as needed for Pain, Disp: , Rfl:   ??? VORTIoxetine (BRINTELLIX) 10 MG TABS tablet, Take 10 mg by mouth daily, Disp: , Rfl:   ??? PRISTIQ 100 MG TB24, Take by mouth daily , Disp: , Rfl:   ??? metformin (GLUCOPHAGE) 1000 MG tablet, Take 1,000 mg by mouth Daily with supper., Disp: , Rfl:   ??? metformin (GLUCOPHAGE) 500 MG tablet, Take 500 mg by mouth daily (with breakfast).,  Disp: , Rfl:   ??? zolpidem (AMBIEN) 10 MG tablet, Take 10 mg by mouth nightly Takes every night, Disp: , Rfl:   ??? lorazepam (ATIVAN) 1 MG tablet, Take 1 mg by mouth every 6 hours as needed. , Disp: , Rfl:      ??  Subjective:   ????  Review of Systems   Constitutional: Negative for activity change, appetite change, chills, diaphoresis, fatigue, fever and unexpected weight change.   HENT: Negative for congestion, dental problem, drooling, ear discharge, ear pain, facial swelling, hearing loss, mouth sores, nosebleeds, postnasal drip, rhinorrhea, sinus pressure, sneezing, sore throat, tinnitus, trouble swallowing and voice change.   Eyes: Negative for photophobia, pain, discharge, redness, itching and visual disturbance.   Respiratory: Negative for apnea, cough, choking, chest tightness, shortness  of breath, wheezing and stridor.   Cardiovascular: Negative for chest pain, palpitations and leg swelling.   Gastrointestinal: Negative for abdominal distention, abdominal pain, anal bleeding, blood in stool, constipation, diarrhea, nausea, rectal pain and vomiting.   Endocrine: Negative for cold intolerance, heat intolerance, polydipsia, polyphagia and polyuria.   Genitourinary: Negative for decreased urine volume, difficulty urinating, dyspareunia, dysuria, enuresis, flank pain, frequency, genital sores, hematuria, menstrual problem, pelvic pain, urgency, vaginal bleeding, vaginal discharge and vaginal pain.   Musculoskeletal: Positive for back pain. Negative for arthralgias, gait problem, joint swelling, myalgias, neck pain and neck stiffness.   Skin: Negative for color change, pallor, rash and wound.   Allergic/Immunologic: Negative for environmental allergies, food allergies and immunocompromised state.   Neurological: Negative for dizziness, tremors, seizures, syncope, facial asymmetry, speech difficulty, weakness, light-headedness, numbness and headaches.   Hematological: Negative for adenopathy. Does not bruise/bleed easily.   Psychiatric/Behavioral: Negative for agitation, behavioral problems, confusion, decreased concentration, dysphoric mood, hallucinations, self-injury, sleep disturbance and suicidal ideas. The patient is not nervous/anxious and is not hyperactive.   ??  ??  Objective:   ??   Vitals    Vitals:   ?? 11/27/14 1456   BP: (!) 149/92   Pulse: 95   Weight: 188 lb (85.3 kg)   Height: 5' 6.5" (1.689 m)      ??  ??  Physical Exam   Constitutional: She is oriented to person, place, and time. She appears well-developed and well-nourished. No distress.   HENT:   Head: Normocephalic and atraumatic.   Right Ear: External ear normal.   Left Ear: External ear normal.   Nose: Nose normal.   Mouth/Throat: Oropharynx is clear and moist.   Eyes: Conjunctivae and EOM are normal. Pupils are equal, round, and  reactive to light.   Neck: Neck supple. No thyromegaly present.   Decreased range of motion with ear to shoulder both sides, tender posterior over bone    Cardiovascular: Normal rate, regular rhythm and intact distal pulses. Exam reveals no gallop and no friction rub.   No murmur heard.  Pulmonary/Chest: Effort normal and breath sounds normal. No respiratory distress.   Abdominal: Soft. Bowel sounds are normal.   Musculoskeletal: She exhibits tenderness. She exhibits no edema or deformity.   Right shoulder: She exhibits normal range of motion and no tenderness.   Left shoulder: She exhibits normal range of motion and no tenderness.   Left elbow: She exhibits normal range of motion.   Right knee: She exhibits normal range of motion. No tenderness found.   Left knee: She exhibits normal range of motion. No tenderness found.   Cervical back: She exhibits tenderness.   Thoracic back: She exhibits normal  range of motion and no tenderness.   Lumbar back: She exhibits decreased range of motion, tenderness and bony tenderness.   Left upper arm: She exhibits no tenderness and no bony tenderness.   Left forearm: She exhibits no tenderness and no bony tenderness.   Arms:  Right SLR + at 90 degrees, Left SLR negative    Neurological: She is alert and oriented to person, place, and time. She has normal reflexes. She displays no atrophy. No sensory deficit. She exhibits normal muscle tone. Coordination abnormal. Gait normal.   Reflex Scores:  Tricep reflexes are 2+ on the right side and 2+ on the left side.  Bicep reflexes are 2+ on the right side and 2+ on the left side.  Brachioradialis reflexes are 2+ on the right side and 2+ on the left side.  Patellar reflexes are 2+ on the right side and 2+ on the left side.  Achilles reflexes are 2+ on the right side and 2+ on the left side.  Strength left upper extremity 4/5, bilateral lower and right upper extremity 5/5.      Skin: Skin is warm and dry. No rash noted. No erythema.         Psychiatric: She has a normal mood and affect. Her behavior is normal. Thought content normal.   ??     Assessment:   ??  1. Spondylosis of lumbar region without myelopathy or radiculopathy      ROS    Physical Exam     Plan:  Bilateral L-Facet MBB @ L4-5 and L5-S1     Simone Curia, CNP  12/24/2014

## 2014-12-30 ENCOUNTER — Inpatient Hospital Stay: Attending: Pain Medicine

## 2014-12-31 ENCOUNTER — Inpatient Hospital Stay: Attending: Pain Medicine

## 2015-01-06 ENCOUNTER — Ambulatory Visit: Admit: 2015-01-06

## 2015-01-06 ENCOUNTER — Inpatient Hospital Stay: Admit: 2015-01-06 | Attending: Anesthesiology

## 2015-01-06 LAB — POCT GLUCOSE: POC Glucose: 152 mg/dl — ABNORMAL HIGH (ref 70–108)

## 2015-01-06 MED ORDER — LIDOCAINE HCL 1 % IJ SOLN
1 % | INTRAMUSCULAR | Status: AC
Start: 2015-01-06 — End: ?

## 2015-01-06 MED ORDER — BETAMETHASONE SOD PHOS & ACET 6 (3-3) MG/ML IJ SUSP
6 (3-3) MG/ML | INTRAMUSCULAR | Status: AC
Start: 2015-01-06 — End: ?

## 2015-01-06 MED ORDER — BUPIVACAINE HCL (PF) 0.25 % IJ SOLN
0.25 % | INTRAMUSCULAR | Status: AC
Start: 2015-01-06 — End: ?

## 2015-01-06 MED ORDER — PROPOFOL 200 MG/20ML IV EMUL
20020 MG/20ML | INTRAVENOUS | Status: AC
Start: 2015-01-06 — End: ?

## 2015-01-06 MED FILL — DIPRIVAN 200 MG/20ML IV EMUL: 200 MG/20ML | INTRAVENOUS | Qty: 20

## 2015-01-06 MED FILL — BETAMETHASONE SOD PHOS & ACET 6 (3-3) MG/ML IJ SUSP: 6 (3-3) MG/ML | INTRAMUSCULAR | Qty: 1

## 2015-01-06 MED FILL — LIDOCAINE HCL 1 % IJ SOLN: 1 % | INTRAMUSCULAR | Qty: 20

## 2015-01-06 NOTE — Anesthesia Pre-Procedure Evaluation (Signed)
Department of Anesthesiology  Preprocedure Note       Name:  Theresa Vargas   Age:  59 y.o.  DOB:  12-10-1955                                          MRN:  [id number]         Date:  01/06/2015      Surgeon:    Procedure:    Medications prior to admission:   Prior to Admission medications    Medication Sig Start Date End Date Taking? Authorizing Provider   HYDROcodone-acetaminophen (NORCO) 5-325 MG per tablet Take 1 tablet by mouth 2 times daily as needed for Pain 12/19/14  Yes Garald Braver, MD   HYDROcodone-acetaminophen [facility]) 5-325 MG per tablet Take 1 tablet by mouth 2 times daily as needed for Pain Fill on/after 01/18/2015 12/19/14  Yes Garald Braver, MD   HYDROcodone-acetaminophen [facility]) 5-325 MG per tablet Take 1 tablet by mouth 2 times daily as needed for Pain Fill on/after 02/17/2015 12/19/14  Yes Garald Braver, MD   hydrochlorothiazide (HYDRODIURIL) 25 MG tablet Take 1 tablet by mouth daily 12/03/14  Yes Modena Nunnery, DO   diltiazem (CARDIZEM CD) 240 MG extended release capsule Take 1 capsule by mouth daily 12/03/14  Yes Modena Nunnery, DO   gabapentin (NEURONTIN) 100 MG capsule Take 1 capsule by mouth 2 times daily 11/14/14  Yes Garald Braver, MD   SEROQUEL XR 50 MG XR tablet Take 100 mg by mouth daily 07/07/14  Yes Historical Provider, MD   cyclobenzaprine (FLEXERIL) 10 MG tablet Take 10 mg by mouth as needed Indications: as needed    Yes Historical Provider, MD   ibuprofen (ADVIL;MOTRIN) 800 MG tablet Take 800 mg by mouth every 6 hours as needed for Pain   Yes Historical Provider, MD   VORTIoxetine (BRINTELLIX) 10 MG TABS tablet Take 10 mg by mouth daily   Yes Historical Provider, MD   PRISTIQ 100 MG TB24 Take by mouth daily  04/12/14  Yes Historical Provider, MD   metformin (GLUCOPHAGE) 1000 MG tablet Take 1,000 mg by mouth Daily with supper.   Yes Historical Provider, MD   metformin (GLUCOPHAGE) 500 MG tablet Take 500 mg by mouth daily (with breakfast).   Yes Historical Provider, MD   zolpidem  (AMBIEN) 10 MG tablet Take 10 mg by mouth nightly Takes every night   Yes Historical Provider, MD   lorazepam (ATIVAN) 1 MG tablet Take 1 mg by mouth every 6 hours as needed.     Yes Historical Provider, MD       Current medications:    Current Outpatient Prescriptions   Medication Sig Dispense Refill   ??? HYDROcodone-acetaminophen (NORCO) 5-325 MG per tablet Take 1 tablet by mouth 2 times daily as needed for Pain 60 tablet 0   ??? HYDROcodone-acetaminophen (NORCO) 5-325 MG per tablet Take 1 tablet by mouth 2 times daily as needed for Pain Fill on/after 01/18/2015 60 tablet 0   ??? HYDROcodone-acetaminophen (NORCO) 5-325 MG per tablet Take 1 tablet by mouth 2 times daily as needed for Pain Fill on/after 02/17/2015 60 tablet 0   ??? hydrochlorothiazide (HYDRODIURIL) 25 MG tablet Take 1 tablet by mouth daily 90 tablet 3   ??? diltiazem (CARDIZEM CD) 240 MG extended release capsule Take 1 capsule by mouth daily 90 capsule 3   ???  gabapentin (NEURONTIN) 100 MG capsule Take 1 capsule by mouth 2 times daily 60 capsule 2   ??? SEROQUEL XR 50 MG XR tablet Take 100 mg by mouth daily     ??? cyclobenzaprine (FLEXERIL) 10 MG tablet Take 10 mg by mouth as needed Indications: as needed      ??? ibuprofen (ADVIL;MOTRIN) 800 MG tablet Take 800 mg by mouth every 6 hours as needed for Pain     ??? VORTIoxetine (BRINTELLIX) 10 MG TABS tablet Take 10 mg by mouth daily     ??? PRISTIQ 100 MG TB24 Take by mouth daily      ??? metformin (GLUCOPHAGE) 1000 MG tablet Take 1,000 mg by mouth Daily with supper.     ??? metformin (GLUCOPHAGE) 500 MG tablet Take 500 mg by mouth daily (with breakfast).     ??? zolpidem (AMBIEN) 10 MG tablet Take 10 mg by mouth nightly Takes every night     ??? lorazepam (ATIVAN) 1 MG tablet Take 1 mg by mouth every 6 hours as needed.         No current facility-administered medications for this encounter.        Allergies:  No Known Allergies    Problem List:    Patient Active Problem List   Diagnosis Code   ??? OM (otitis media) H66.90   ??? ETD  (eustachian tube dysfunction) H69.80   ??? Dizziness and giddiness R42   ??? Anal skin tag K64.4   ??? Tinnitus of both ears H93.13   ??? Chronic sinusitis J32.9   ??? Acute URI J06.9   ??? Hypertrophy of nasal turbinates J34.3   ??? Nasal septal deviation J34.2   ??? Status post myringotomy with insertion of tube Z96.22   ??? Eustachian tube dysfunction H69.80   ??? Otitis media H66.90   ??? Heart palpitations R00.2   ??? Essential hypertension I10       Past Medical History:        Diagnosis Date   ??? Diabetes mellitus (HCC)    ??? Hypertension    ??? Nausea & vomiting    ??? PONV (postoperative nausea and vomiting)    ??? Psychiatric problem        Past Surgical History:        Procedure Laterality Date   ??? Cholecystectomy  2007   ??? Hysterectomy  2007   ??? Neck surgery  [facility]     ACDF   ??? Tympanostomy tube placement  1610,9604   ??? Skin biopsy  2007     left cheek   ??? Colonoscopy       2006   ??? Hemorrhoid surgery  05/2010   ??? Other surgical history  11/05/10     hemmroidectomy   ??? Nerve block  10/29/2014     LUMBAR FACET INJECTION       Social History:    Social History   Substance Use Topics   ??? Smoking status: Never Smoker   ??? Smokeless tobacco: Never Used   ??? Alcohol use No                                Counseling given: Not Answered      Vital Signs (Current):   Vitals:    01/06/15 1051   BP: (!) 166/85   Pulse: 93   Resp: 16   Temp: 97.9 ??F (36.6 ??C)   TempSrc: Temporal  SpO2: 95%   Weight: 189 lb 6.4 oz (85.9 kg)   Height: 5\' 6"  (1.676 m)                                              BP Readings from Last 3 Encounters:   01/06/15 (!) 166/85   12/19/14 (!) 142/94   12/03/14 155/89       NPO Status: Time of last liquid consumption: 0930 (sip of water with medication)                        Time of last solid consumption: 2345                        Date of last liquid consumption: 01/06/15                        Date of last solid food consumption: 01/05/15    BMI:   Wt Readings from Last 3 Encounters:   01/06/15 189 lb 6.4 oz  (85.9 kg)   12/19/14 190 lb 6.4 oz (86.4 kg)   12/03/14 185 lb (83.9 kg)     Body mass index is 30.57 kg/(m^2).    Anesthesia Evaluation  Patient summary reviewed   history of anesthetic complications: PONV  Airway: Mallampati: III  TM distance: >3 FB   Neck ROM: full  Mouth opening: > = 3 FB Dental:          Pulmonary:          Cardiovascular:                   Neuro/Psych:      GI/Hepatic/Renal:           Endo/Other:          Abdominal:                    Anesthesia Plan    ASA 2     MAC     intravenous induction   Anesthetic plan and risks discussed with patient.    Plan discussed with CRNA.            Hulan AmatoAndrew M. Donnie Panik, DO   01/06/2015

## 2015-01-06 NOTE — Op Note (Signed)
Pre-Procedure Note    Patient Name: Theresa BuckerSandra A Backes   Date of Birth:[birth year]  Room/Bed: Room/bed info not found  Medical Record Number: [id number]  Date: 01/06/2015      Indication:  Lower back pain    Consent: On file.    Vital Signs:   Vitals:    01/06/15 1051   BP: (!) 166/85   Pulse: 93   Resp: 16   Temp: 97.9 ??F (36.6 ??C)   SpO2: 95%       Pre-Sedation Documentation and Exam:   Vital signs have been reviewed (see flow sheet for vitals).        Sedation/ Anesthesia Plan:   MAC    Patient is an appropriate candidate for plan of sedation: yes    Preoperative Diagnosis:   L-spondylosis    Postoperative Diagnosis:   As above    Procedure Performed:  Diagnostic/Confirmatory median branch blocks at the levels of L4-5 and L5-S1 bilateral under fluoroscopic guidance #2.    Indication for the Procedure:  The patient has a history of chronic low back pain that is not responding well to the conservative treatment.  The patient's pain is mostly axial in nature.  Pain is interfering with the activities of daily living.  Physical examination revealed facet tenderness and facet loading is positive. The procedure and risks  were discussed with the patient and an informed consent was obtained.    Procedure: Bilateral    Patient is placed in prone position and skin over  the back was prepped and draped in sterile manner. Then using fluoroscopy the junction of the transverse process of the vertebra with the superior process of the facet joint was observed and the view was optimized. The skin and deep tissues posterior were infiltrated with 8 ml of 1% lidocaine. Then a #22-gauge 3-1/2  inch spinal needle was introduced through the skin wheal under fluoroscopy guidance such that the tip of the needle lies at the junction of the transverse process with the superior processes of the facet joint.. Then after negative aspiration a total of 6 ml of 0.25% Marcaine and Celestone (total 12 mg) was injected through the needles.  This was  done at the levels of L4-5 and L5-S1 bilateral.  For L5 Median branch block the junction of the ala of  the sacrum with the superior articular process of the facet joint was taken as a reference point and L4 median branch the junction of the transverse process the L5 with the superolateral possible facet joint was taken to the point and healthy median branch the junction of the transverse process of L4 with the superior lateral process of the facet joint was taken as a reference point. For S1 median branch the most lateral and superior aspect of S1 foramina was taken as a reference point.    Patient's vital signs and neurological status remained stable through  the procedure and post procedural  Period.  Patient was instructed to keep track of pain for next 24 hours. every hour and bring it with next visit. Patient tollerated the procedure well and was discharged home in stable condition.

## 2015-01-06 NOTE — Anesthesia Post-Procedure Evaluation (Signed)
[facility]  POST-ANESTHESIA NOTE       Name:  Theresa Vargas                                         Age:  59 y.o.  MRN:  [id number]      Last Vitals:    Visit Vitals   ??? BP 140/86   ??? Pulse 87   ??? Temp 98.6 ??F (37 ??C) (Temporal)   ??? Resp 16   ??? Ht 5\' 6"  (1.676 m)   ??? Wt 189 lb 6.4 oz (85.9 kg)   ??? SpO2 97%   ??? BMI 30.57 kg/m2     Patient Vitals for the past 4 hrs:   BP Temp Temp src Pulse Resp SpO2 Height Weight   01/06/15 1159 140/86 98.6 ??F (37 ??C) Temporal 87 16 97 % - -   01/06/15 1051 (!) 166/85 97.9 ??F (36.6 ??C) Temporal 93 16 95 % 5\' 6"  (1.676 m) 189 lb 6.4 oz (85.9 kg)       Level of Consciousness:  Awake    Respiratory:  Stable    Oxygen Saturation:  Stable    Cardiovascular:  Stable    Hydration:  Adequate    PONV:  Stable    Post-op Pain:  Adequate analgesia    Post-op Assessment:  No apparent anesthetic complications    Additional Follow-Up / Treatment / Comment:  None    Hulan AmatoAndrew M. Josslyn Ciolek, DO  January 06, 2015   1:16 PM

## 2015-01-06 NOTE — Progress Notes (Signed)
1159: Patient to recovery room via cart. Patient awake and alert. Vitals obtained, see charting.   1202: Dr. Leticia Clasivera in room to see patient and notified of patient's pain level.   1204: Patient is eating and drinking offered snacks. Family at bedside.   1223: Discharge instructions given and explained to patient and patient's family. Both verbalized understanding.

## 2015-01-08 ENCOUNTER — Encounter: Payer: PRIVATE HEALTH INSURANCE | Attending: Pulmonary Disease

## 2015-01-15 ENCOUNTER — Ambulatory Visit: Admit: 2015-01-15 | Discharge: 2015-01-15 | Payer: PRIVATE HEALTH INSURANCE | Attending: Pulmonary Disease

## 2015-01-15 DIAGNOSIS — J3089 Other allergic rhinitis: Secondary | ICD-10-CM

## 2015-01-15 MED ORDER — FLUTICASONE PROPIONATE 50 MCG/ACT NA SUSP
50 MCG/ACT | Freq: Every day | NASAL | 6 refills | Status: DC
Start: 2015-01-15 — End: 2015-12-22

## 2015-01-15 NOTE — Patient Instructions (Signed)
Recommendations/Plan:  - Start patient on Flonase 2550mcg/INH 2sprays each nostril daily. She  was informed about adverse effects of Flonase. She was demonstrated in my office how to use Flonase. She verbalizes understanding.  -Patient will be given a prescription for chin strap to use with her current mask to avoid leaks and to improve her dryness of mouth.  -She was advised to continue current positive airway pressure therapy with above described pressure.  -She advised to keep good compliance with current recommended pressure to get optimal results and clinical improvement.  -Follow with my clinic in 4months (J) for clinical reevaluation with review of download.  -She was advised to call DME company regarding supplies if needed.  -She was instructed to extend her sleep schedule to 7 to 9 hours in a given 24hour period continuously.  -She was advised to call my office for earlier appointment if needed for worsening of sleep symptoms.  -Theresa Vargas was educated about my impression and plan. Patient verbalizes understanding.

## 2015-01-15 NOTE — Progress Notes (Deleted)
Chief Complaint:  Theresa Vargas is here for two month follow up for Obstructive Sleep Apnea with a download.    Mallampati airway Class:  IV  Neck Circumference:  13.5 Inches    Epworth sleepiness score 01/15/15:  0.      Diagnostic Data: 09-20-14  AHI 14.3  PAP Download:    Recorded compliance dates:  11-06-14 -  01-08-15  More than 4hour usage compliance was:  83%.  Average residual Apnea- Hypoapnea index on current pressue was: 0.9      PAP Type C PAP   Level  7cmH2O      Average usuage hours per day was: 5 Hrs 52 min  Interface: Nasal    Provider:  [] [facility]  [x] [facility]  [place] [] [employer]  [] [facility]         [] [facility] [] Other:

## 2015-01-15 NOTE — Progress Notes (Signed)
Sleep Medicine clinic  Follow up for Sleep Apnea    Theresa Vargas                                             Chief Complaint: Theresa Vargas is here for two month follow up for Obstructive Sleep Apnea with a download.    Theresa Vargas is a 59 y.o.oldfemale came for follow up regarding her sleep apnea. She is currently using her positive airway pressure device with a CPAP pressure of 7cm H20. She denies any problems with machine or mask. She is waking up in the morning with dry mouth. She is sleeping well at night with out difficulty.She denies any daytime sleepiness.      Review of Systems   General/Constitutional: she gained 10 lbs of weight from the last visit with normal appetite. No fever or chills.  HENT: Negative.   Eyes: Negative.  Upper respiratory tract: Frequent nasal stuffiness with post nasal drip. She is currently not using any nasal spray.  Lower respiratory tract/ lungs: No cough or sputum production. No hemoptysis.  Cardiovascular: No palpitations or chest pain.  Gastrointestinal: No nausea or vomiting.  Neurological: No focal neurologiacal weakness.  Extremities: No edema.  Musculoskeletal: No complaints.  Genitourinary: No complaints.  Hematological: Negative.   Psychiatric/Behavioral: Negative.   Skin: No itching.         Past Medical History   Diagnosis Date   ??? Diabetes mellitus (HCC)    ??? Hypertension    ??? Nausea & vomiting    ??? PONV (postoperative nausea and vomiting)    ??? Psychiatric problem        Past Surgical History   Procedure Laterality Date   ??? Cholecystectomy  2007   ??? Hysterectomy  2007   ??? Neck surgery  [facility]     ACDF   ??? Tympanostomy tube placement  [id number]   ??? Skin biopsy  2007     left cheek   ??? Colonoscopy       2006   ??? Hemorrhoid surgery  05/2010   ??? Other surgical history  11/05/10     hemmroidectomy   ??? Nerve block  10/29/2014     LUMBAR FACET INJECTION   ??? Other surgical history Bilateral 01/06/2015     Lumbar Facet       Social History    Substance Use Topics   ??? Smoking status: Never Smoker   ??? Smokeless tobacco: Never Used   ??? Alcohol use No       No Known Allergies    Current Outpatient Prescriptions   Medication Sig Dispense Refill   ??? HYDROcodone-acetaminophen (NORCO) 5-325 MG per tablet Take 1 tablet by mouth 2 times daily as needed for Pain 60 tablet 0   ??? HYDROcodone-acetaminophen (NORCO) 5-325 MG per tablet Take 1 tablet by mouth 2 times daily as needed for Pain Fill on/after 01/18/2015 60 tablet 0   ??? HYDROcodone-acetaminophen (NORCO) 5-325 MG per tablet Take 1 tablet by mouth 2 times daily as needed for Pain Fill on/after 02/17/2015 60 tablet 0   ??? hydrochlorothiazide (HYDRODIURIL) 25 MG tablet Take 1 tablet by mouth daily 90 tablet 3   ??? diltiazem (CARDIZEM CD) 240 MG extended release capsule Take 1 capsule by mouth daily 90 capsule 3   ??? gabapentin (NEURONTIN) 100 MG capsule Take 1 capsule  by mouth 2 times daily 60 capsule 2   ??? SEROQUEL XR 50 MG XR tablet Take 100 mg by mouth daily     ??? cyclobenzaprine (FLEXERIL) 10 MG tablet Take 10 mg by mouth as needed Indications: as needed      ??? ibuprofen (ADVIL;MOTRIN) 800 MG tablet Take 800 mg by mouth every 6 hours as needed for Pain     ??? VORTIoxetine (BRINTELLIX) 10 MG TABS tablet Take 10 mg by mouth daily     ??? PRISTIQ 100 MG TB24 Take by mouth daily      ??? metformin (GLUCOPHAGE) 1000 MG tablet Take 1,000 mg by mouth Daily with supper.     ??? metformin (GLUCOPHAGE) 500 MG tablet Take 500 mg by mouth daily (with breakfast).     ??? zolpidem (AMBIEN) 10 MG tablet Take 10 mg by mouth nightly Takes every night     ??? lorazepam (ATIVAN) 1 MG tablet Take 1 mg by mouth every 6 hours as needed.         No current facility-administered medications for this visit.        Family History   Problem Relation Age of Onset   ??? Diabetes Other    ??? Hypertension Other    ??? Mental Illness Other    ??? Heart Disease Other    ??? Other Other      bone and joint problems          Visit Vitals   ??? BP 130/84 (Site: Left  Arm, Position: Sitting, Cuff Size: Large Adult)   ??? Pulse 92   ??? Ht 5\' 6"  (1.676 m)   ??? Wt 192 lb 9.6 oz (87.4 kg)   ??? SpO2 96%  Comment: on RA   ??? BMI 31.09 kg/m2        BMI:  Body mass index is 31.09 kg/(m^2).     Mallampati airway Class: IV  Neck Circumference: 13.5 Inches??  Epworth sleepiness score 01/15/15: 0.      Physical Exam :  Constitutional: Patient appears moderately built and moderately nourished. No distress. Patient is oriented to person, place, and time.  HENT: Hypertrophied nasal turbinates with pale nasal mucosa bilaterally. Deviated nasal septum to right side.  Head: Normocephalic and atraumatic.   Right Ear: External ear normal.   Left Ear: External ear normal.   Mouth/Throat: Oropharynx is clear and moist.   Eyes: Conjunctivae are normal. Pupils are equal and reactive to light. No scleral icterus.   Neck: Neck supple. No JVD present.   Cardiovascular: Normal rate, regular rhythm, normal heart sounds. No murmur heard.   Pulmonary/Chest: Effort normal and breath sounds normal. No stridor. No respiratory distress.  No wheezes. No rales.   Abdominal: Soft. Patient exhibits no distension. No tenderness.   Musculoskeletal: Normal range of motion.   Extremities:Patient exhibits no edema and no tenderness.   Lymphadenopathy:  No cervical adenopathy.   Neurological: Patient is alert and oriented to person, place, and time.   Skin: Skin is warm and dry. Patient is not diaphoretic.   Psychiatric: Patient  has a normal mood and affect.    Diagnostic Data: 09-20-14 AHI 14.3  PAP Download:   Recorded compliance dates: 11-06-14 - 01-08-15  More than 4hour usage compliance was: 63%.  Average residual Apnea- Hypoapnea index on current pressue was: 0.9  ??  PAP Type C PAP Level 7cmH2O   ????  Average usuage hours per day was: 5 Hrs 52 min  Interface: Nasal  ??  Provider: [facility]  Jefferey Pica [employer]  [facility]   [facility] Other      CPAP study:  PATIENT NAME: Theresa Bury A. DOB: March 28, 1955  MED REC NO:  [id number] ROOM: SL   ACCOUNT NO: [id number] ADMISSION DATE: 10/09/2014  PHYSICIAN: Kathlyn Sacramento, M.Vargas.         DATE OF STUDY: 10/09/2014     REPORT TITLE: CPAP titration study report.     IMPRESSION:  1. Mild obstructive sleep apnea. The patient had optimal titration to a CPAP  pressure of 7 cm of water.  2. Decreased and delayed REM sleep.  3. Hypertension.  4. Depression.  5. Chronic back pain, currently on treatment with Norco.  6. Diabetes mellitus.      Assesment:  -Mild obstructive sleep apnea on treatment with a CPAP pressure of 7 cm of water.- Her AHI is under good control with current therapy with significant improvement in clinical symptoms. How ever she is having poor compliance to >4hours  -Decreased and delayed REM sleep.  -Hypertension.  -Insufficient sleep syndrome.  -Depression.  -Chronic back pain, currently on treatment with Norco.  -Diabetes mellitus.  -Allergic rhinitis.  -Deviated nasal septum to right side    Recommendations/Plan:  - Start patient on Flonase 39mcg/INH 2sprays each nostril daily. She  was informed about adverse effects of Flonase. She was demonstrated in my office how to use Flonase. She verbalizes understanding.  -Patient will be given a prescription for chin strap to use with her current mask to avoid leaks and to improve her dryness of mouth.  -She was advised to continue current positive airway pressure therapy with above described pressure.  -She advised to keep good compliance with current recommended pressure to get optimal results and clinical improvement.  -Follow with my clinic in 4months (J) for clinical reevaluation with review of download.  -She was advised to call DME company regarding supplies if needed.  -She was instructed to extend her sleep schedule to 7 to 9 hours in a given 24hour period continuously.  -She was advised to call my office for earlier appointment if needed for worsening of sleep symptoms.  -EMMILY PELLEGRIN was educated about my impression  and plan. Patient verbalizes understanding.

## 2015-01-29 ENCOUNTER — Emergency Department: Admit: 2015-01-29

## 2015-01-29 ENCOUNTER — Inpatient Hospital Stay
Admit: 2015-01-29 | Discharge: 2015-01-29 | Disposition: A | Discharge status: Home / Self Care | Attending: Chiropractor

## 2015-01-29 DIAGNOSIS — T6591XA Toxic effect of unspecified substance, accidental (unintentional), initial encounter: Secondary | ICD-10-CM

## 2015-01-29 LAB — TSH: TSH: 5.38 mcIU/ml — ABNORMAL HIGH (ref 0.400–4.20)

## 2015-01-29 LAB — HEPATIC FUNCTION PANEL
ALT: 19 U/L (ref 11–66)
AST: 17 U/L (ref 5–40)
Albumin: 4.4 gm/dl (ref 3.5–5.1)
Alkaline Phosphatase: 100 U/L (ref 38–126)
Bilirubin, Direct: <0.2 mg/dl (ref 0.0–0.3)
Total Bilirubin: 0.2 mg/dl — ABNORMAL LOW (ref 0.3–1.2)
Total Protein: 7.1 gm/dl (ref 6.1–8.0)

## 2015-01-29 LAB — TROPONIN: Troponin T: <0.01 ng/ml

## 2015-01-29 LAB — CBC WITH AUTO DIFFERENTIAL
Basophils Absolute: 0 10*3/uL (ref 0.0–0.1)
Basophils: 0.6 %
Eosinophils Absolute: 0.2 10*3/uL (ref 0.0–0.4)
Eosinophils: 2.6 %
Hematocrit: 41.2 % (ref 37.0–47.0)
Hemoglobin: 14 gm/dl (ref 12.0–16.0)
Lymphocytes Absolute: 2.5 10*3/uL (ref 1.0–4.8)
Lymphocytes: 34.4 %
MCH: 28.7 pg (ref 27.0–31.0)
MCHC: 33.9 gm/dl (ref 33.0–37.0)
MCV: 84.6 fL (ref 81.0–99.0)
MPV: 6.8 mcm — ABNORMAL LOW (ref 7.4–10.4)
Monocytes Absolute: 0.4 10*3/uL (ref 0.4–1.3)
Monocytes: 5.4 %
Platelets: 376 10*3/uL (ref 130–400)
RBC Morphology: NORMAL
RBC: 4.87 10*6/uL (ref 4.20–5.40)
RDW: 13.9 % (ref 11.5–14.5)
Seg Neutrophils: 57 %
Segs Absolute: 4.2 10*3/uL (ref 1.8–7.7)
WBC: 7.4 10*3/uL (ref 4.8–10.8)
nRBC: 0 /100 wbc

## 2015-01-29 LAB — BASIC METABOLIC PANEL
BUN: 16 mg/dl (ref 7–22)
CO2: 20 meq/l — ABNORMAL LOW (ref 23–33)
Calcium: 9 mg/dl (ref 8.5–10.5)
Chloride: 99 meq/l (ref 98–111)
Creatinine: 0.9 mg/dl (ref 0.4–1.2)
Glucose: 180 mg/dl — ABNORMAL HIGH (ref 70–108)
Potassium: 3.5 meq/l (ref 3.5–5.2)
Sodium: 141 meq/l (ref 135–145)

## 2015-01-29 LAB — BRAIN NATRIURETIC PEPTIDE: Pro-BNP: <5 pg/ml (ref 0.0–900.0)

## 2015-01-29 LAB — OSMOLALITY: Osmolality Calc: 287 mOsmol/kg (ref >=275.0)

## 2015-01-29 LAB — GLOMERULAR FILTRATION RATE, ESTIMATED: Est, Glom Filt Rate: 65 mL/min/{1.73_m2} — AB

## 2015-01-29 LAB — MAGNESIUM: Magnesium: 1.9 mg/dl (ref 1.6–2.4)

## 2015-01-29 LAB — ANION GAP: Anion Gap: 22 meq/l — ABNORMAL HIGH (ref 8.0–16.0)

## 2015-01-29 MED ORDER — SODIUM CHLORIDE 0.9 % IV BOLUS
0.9 % | Freq: Once | INTRAVENOUS | Status: DC
Start: 2015-01-29 — End: 2015-01-29

## 2015-01-29 MED ORDER — GI COCKTAIL
Freq: Once | Status: AC
Start: 2015-01-29 — End: 2015-01-29
  Administered 2015-01-29: 07:00:00 15 mL via ORAL

## 2015-01-29 MED ORDER — ONDANSETRON HCL 4 MG PO TABS
4 MG | ORAL_TABLET | Freq: Three times a day (TID) | ORAL | 0 refills | Status: DC | PRN
Start: 2015-01-29 — End: 2015-09-22

## 2015-01-29 MED ORDER — ONDANSETRON 4 MG PO TBDP
4 MG | Freq: Once | ORAL | Status: AC
Start: 2015-01-29 — End: 2015-01-29
  Administered 2015-01-29: 07:00:00 4 mg via ORAL

## 2015-01-29 MED FILL — ONDANSETRON 4 MG PO TBDP: 4 MG | ORAL | Qty: 1

## 2015-01-29 MED FILL — GI COCKTAIL: Qty: 15

## 2015-01-29 NOTE — ED Triage Notes (Signed)
Pt to ED room 11 for c/o cough and emesis.  Pt states, "I wasn't able to sleep so I decided to take some tylenol and it went down ok and then all of a sudden I started coughing until I puked.  There is a burning sensation in my throat and it hurts."  Rates pain 10/10.

## 2015-01-29 NOTE — ED Notes (Signed)
Bed: 011A  Expected date: 01/29/15  Expected time:   Means of arrival: [facility]  Comments:     Delrae SawyersAmanda L Sanford, RN  01/29/15 [phone]

## 2015-01-29 NOTE — ED Provider Notes (Signed)
[facility]  eMERGENCY dEPARTMENT eNCOUnter          CHIEF COMPLAINT       Chief Complaint   Patient presents with   ??? Cough   ??? Emesis       Nurses Notes reviewed and I agree except as noted in the HPI.      HISTORY OF PRESENT ILLNESS    Theresa Vargas is a 59 y.o. female who presents with complaints of coughing after having taken NyQuil and getting down the wrong tube.  She states that this is been going on for the last hour and so she decided to come in for evaluation and treatment.  She took the NyQuil because she had trouble getting to sleep.  Currently the patient is not complaining of any itching throat swelling, or rash.  She has no chest pain or shortness of breath.  She has no abdominal pain or changes bowel or bladder.     REVIEW OF SYSTEMS     Review of Systems   Constitutional: Negative for activity change, appetite change, diaphoresis, fatigue and unexpected weight change.   HENT: Negative for congestion, ear discharge, ear pain, hearing loss, rhinorrhea, sinus pressure, sore throat, trouble swallowing and voice change.    Eyes: Negative for photophobia, pain, discharge, redness and itching.   Respiratory: Positive for cough and choking. Negative for chest tightness, shortness of breath and wheezing.    Cardiovascular: Negative for chest pain, palpitations and leg swelling.   Gastrointestinal: Negative for abdominal distention, abdominal pain, blood in stool, constipation, diarrhea, nausea and vomiting.   Endocrine: Negative for polydipsia, polyphagia and polyuria.   Genitourinary: Negative for decreased urine volume, difficulty urinating, dysuria, enuresis, frequency, hematuria and urgency.   Musculoskeletal: Negative for arthralgias, back pain, gait problem, myalgias, neck pain and neck stiffness.   Skin: Negative for pallor and rash.   Allergic/Immunologic: Negative for immunocompromised state.   Neurological: Negative for dizziness, tremors, seizures, syncope, facial asymmetry,  weakness, light-headedness, numbness and headaches.   Hematological: Negative for adenopathy. Does not bruise/bleed easily.   Psychiatric/Behavioral: Negative for agitation, hallucinations and suicidal ideas. The patient is not nervous/anxious.        PAST MEDICAL HISTORY    has a past medical history of Diabetes mellitus (HCC); Hypertension; Nausea & vomiting; PONV (postoperative nausea and vomiting); and Psychiatric problem.    SURGICAL HISTORY      has a past surgical history that includes Cholecystectomy (2007); Hysterectomy (2007); Neck surgery (2008 [facility]); Tympanostomy tube placement (1610,9604); skin biopsy (2007); Colonoscopy; Hemorrhoid surgery (05/2010); other surgical history (11/05/10); Nerve Block (10/29/2014); and other surgical history (Bilateral, 01/06/2015).    CURRENT MEDICATIONS       Discharge Medication List as of 01/29/2015  3:52 AM      CONTINUE these medications which have NOT CHANGED    Details   fluticasone (FLONASE) 50 MCG/ACT nasal spray 2 sprays by Nasal route daily, Disp-1 Bottle, R-6      !! HYDROcodone-acetaminophen (NORCO) 5-325 MG per tablet Take 1 tablet by mouth 2 times daily as needed for Pain, Disp-60 tablet, R-0      !! HYDROcodone-acetaminophen (NORCO) 5-325 MG per tablet Take 1 tablet by mouth 2 times daily as needed for Pain Fill on/after 01/18/2015, Disp-60 tablet, R-0      !! HYDROcodone-acetaminophen (NORCO) 5-325 MG per tablet Take 1 tablet by mouth 2 times daily as needed for Pain Fill on/after 02/17/2015, Disp-60 tablet, R-0  hydrochlorothiazide (HYDRODIURIL) 25 MG tablet Take 1 tablet by mouth daily, Disp-90 tablet, R-3      diltiazem (CARDIZEM CD) 240 MG extended release capsule Take 1 capsule by mouth daily, Disp-90 capsule, R-3      gabapentin (NEURONTIN) 100 MG capsule Take 1 capsule by mouth 2 times daily, Disp-60 capsule, R-2      SEROQUEL XR 50 MG XR tablet Take 100 mg by mouth daily, DAW      cyclobenzaprine (FLEXERIL) 10 MG tablet Take 10 mg by  mouth as needed Indications: as needed       ibuprofen (ADVIL;MOTRIN) 800 MG tablet Take 800 mg by mouth every 6 hours as needed for Pain      VORTIoxetine (BRINTELLIX) 10 MG TABS tablet Take 10 mg by mouth daily      PRISTIQ 100 MG TB24 Take by mouth daily , DAW      !! metformin (GLUCOPHAGE) 1000 MG tablet Take 1,000 mg by mouth Daily with supper.      !! metformin (GLUCOPHAGE) 500 MG tablet Take 500 mg by mouth daily (with breakfast).      zolpidem (AMBIEN) 10 MG tablet Take 10 mg by mouth nightly Takes every night      lorazepam (ATIVAN) 1 MG tablet Take 1 mg by mouth every 6 hours as needed.         !! - Potential duplicate medications found. Please discuss with provider.          ALLERGIES     has No Known Allergies.    FAMILY HISTORY     indicated that the status of her other is unknown.  family history includes Diabetes in an other family member; Heart Disease in an other family member; Hypertension in an other family member; Mental Illness in an other family member; Other in an other family member.    SOCIAL HISTORY      reports that she has never smoked. She has never used smokeless tobacco. She reports that she does not drink alcohol or use illicit drugs.    PHYSICAL EXAM     INITIAL VITALS:  height is 5' 6.5" (1.689 m) and weight is 190 lb (86.2 kg). Her oral temperature is 98.3 ??F (36.8 ??C). Her blood pressure is 141/91 (abnormal) and her pulse is 102. Her respiration is 18 and oxygen saturation is 98%.    Physical Exam   Constitutional: She is oriented to person, place, and time. She appears well-developed and well-nourished. No distress.   HENT:   Head: Normocephalic and atraumatic.   Right Ear: External ear normal.   Left Ear: External ear normal.   Nose: Nose normal.   Mouth/Throat: Uvula is midline, oropharynx is clear and moist and mucous membranes are normal. No dental abscesses or dental caries. No oropharyngeal exudate, posterior oropharyngeal edema, posterior oropharyngeal erythema or  tonsillar abscesses.   No evidence of swelling and erythema edema.  Malampatti score of 2.   Eyes: Conjunctivae and EOM are normal. Pupils are equal, round, and reactive to light. Right eye exhibits no discharge. Left eye exhibits no discharge. No scleral icterus.   Neck: Normal range of motion. Neck supple. No JVD present. No tracheal deviation present.   Cardiovascular: Normal rate, regular rhythm, normal heart sounds and intact distal pulses.  Exam reveals no gallop and no friction rub.    No murmur heard.  Pulmonary/Chest: Effort normal and breath sounds normal. She has no decreased breath sounds. She has no wheezes. She has no  rhonchi. She has no rales.   Abdominal: Soft. Bowel sounds are normal. She exhibits no mass. There is no tenderness. There is no rebound and no guarding.   Musculoskeletal: Normal range of motion. She exhibits no edema or tenderness.   Lymphadenopathy:     She has no cervical adenopathy.   Neurological: She is alert and oriented to person, place, and time. She has normal reflexes. No cranial nerve deficit. She exhibits normal muscle tone. Coordination normal.   Skin: Skin is warm and dry. No rash noted. She is not diaphoretic.   Psychiatric: She has a normal mood and affect. Her behavior is normal. Judgment and thought content normal.   Nursing note and vitals reviewed.      DIFFERENTIAL DIAGNOSIS:   Accidental ingestion and/or inhalation of NyQuil    DIAGNOSTIC RESULTS     EKG: All EKG's are interpreted by the Emergency Department Physician who either signs or Co-signs this chart in the absence of a cardiologist.  none    RADIOLOGY: non-plain film images(s) such as CT, Ultrasound and MRI are read by the radiologist.  XR Chest Standard TWO VW   ED Interpretation   No acute cardiopulmonary processes.      Final Result   1. No acute cardiopulmonary process.            **This report has been created using voice recognition software.  It may contain minor errors which are inherent in voice  recognition technology.**      Final report electronically signed by Dr. Deboraha Sprang on 01/29/2015 6:26 AM               LABS:   Labs Reviewed   CBC WITH AUTO DIFFERENTIAL - Abnormal; Notable for the following:        Result Value    MPV 6.8 (*)     All other components within normal limits   BASIC METABOLIC PANEL - Abnormal; Notable for the following:     CO2 20 (*)     Glucose 180 (*)     All other components within normal limits   HEPATIC FUNCTION PANEL - Abnormal; Notable for the following:     Total Bilirubin 0.2 (*)     All other components within normal limits   TSH WITHOUT REFLEX - Abnormal; Notable for the following:     TSH 5.380 (*)     All other components within normal limits   GLOMERULAR FILTRATION RATE, ESTIMATED - Abnormal; Notable for the following:     Est, Glom Filt Rate 65 (*)     All other components within normal limits   ANION GAP - Abnormal; Notable for the following:     Anion Gap 22.0 (*)     All other components within normal limits   TROPONIN   MAGNESIUM   BRAIN NATRIURETIC PEPTIDE   OSMOLALITY       EMERGENCY DEPARTMENT COURSE:   Vitals:    Vitals:    01/29/15 0120 01/29/15 0210   BP: (!) 147/99 (!) 141/91   Pulse: 111 102   Resp: 18 18   Temp: 98.3 ??F (36.8 ??C)    TempSrc: Oral    SpO2: 96% 98%   Weight: 190 lb (86.2 kg)    Height: 5' 6.5" (1.689 m)      Patient was assessed at bedside appropriate labs and imaging were ordered.  I do not think this is an allergic reaction.  I think this is more of the fact  that she got her NyQuil down her airway and into the trachea.  There is some mild hoarseness of voice which would indicate that the NyQuil stunning her vocal cord slightly.  There is no swelling of the tongue floor of the mouth, no real dental caries, no abscesses.  Posterior oropharynx was clear.  Patient oxygen saturation is good.  I reviewed all labs and imaging went back to assess the patient.  She was given a GI cocktail here to help numb the throat which did a the situation.   The patient was able to drink fluids without difficulty she was having no trouble handling her secretions, and at this point I felt the patient could be subsequent discharged home.  She was warned that a true allergic reaction would involve swelling of the lips tongue or throat, and after might be a rash involved.  They were cautioned have both Benadryl and Pepcid at home.  I did instruct him to follow-up with primary care physician if the voice was not returning to normal within the next 48 hours.  Patient and husband were at bedside understood and agreed with the plan.  Patient was subsequently discharged home in good condition.    Patient had accidental ingestion of NyQuil into her airway.  Patient is informed that this may cause a sensation of fullness in the airway as well as some minor vocal cord sedation.  Coughing and airway clearance may last up to 24 hours.  Patient is instructed to use Zofran for any nausea.  Patient is instructed to return to the emergency room immediately if there is any feeling of airway closure, any increasing rash or swelling.  She has first to take Benadryl and Pepcid and then come here to the emergency room.  Patient is instructed to call her primary care physician in the next 2-3 days if there is any persistence of the coughing or throat irritation.      CRITICAL CARE:   none    CONSULTS:  none    PROCEDURES:  None    FINAL IMPRESSION      1. Accidental ingestion of substance          DISPOSITION/PLAN   discharge    PATIENT REFERRED TO:  Alena BillsJames T Bowlus, MD  [address]  [phone]  [place] [id number]  [phone]    Call in 1 day  If symptoms worsen      DISCHARGE MEDICATIONS:  Discharge Medication List as of 01/29/2015  3:52 AM      START taking these medications    Details   ondansetron (ZOFRAN) 4 MG tablet Take 1 tablet by mouth every 8 hours as needed for Nausea or Vomiting, Disp-15 tablet, R-0             (Please note that portions of this note were completed with  a voice recognition program.  Efforts were made to edit the dictations but occasionally words are mis-transcribed.)    Patrina LeveringJOHN T Byrdie Miyazaki, DO               Patrina LeveringJohn T Lydia Meng, DO  01/29/15 0745

## 2015-03-24 ENCOUNTER — Ambulatory Visit
Admit: 2015-03-24 | Discharge: 2015-03-24 | Payer: PRIVATE HEALTH INSURANCE | Attending: Physical Medicine & Rehabilitation

## 2015-03-24 DIAGNOSIS — M47816 Spondylosis without myelopathy or radiculopathy, lumbar region: Secondary | ICD-10-CM

## 2015-03-24 MED ORDER — HYDROCODONE-ACETAMINOPHEN 5-325 MG PO TABS
5-325 MG | ORAL_TABLET | Freq: Two times a day (BID) | ORAL | 0 refills | Status: DC | PRN
Start: 2015-03-24 — End: 2015-06-23

## 2015-03-24 NOTE — Progress Notes (Signed)
[facility]    Physical Medicine & Rehabilitation  Outpatient progress note    Chief Complaint:   Chief Complaint   Patient presents with   ??? Follow-up     12mo f/u        Subjective: Theresa Vargas is a 60 y.o. female who returns to the office today for further follow up.  Lumbar procedures were beneficial. She is taking Norco for pain. She stopped her gabapentin due to weight gain.     She has been exercising regularly. Would like to lose some weight. She is feeling better.     Patient with low back pain referring into left lower limb.  She has been through therapy, chiropractics, imaging, conservative medication management, and epidural steroidal injections. ??    Review of Systems:  Constitutional: Negative for chills, fever, malaise/fatigue and weight loss.   HENT: Negative for hearing loss.   Eyes: Negative for blurred vision and double vision.   Respiratory: Negative for cough and shortness of breath.   Cardiovascular: Negative for chest pain, palpitations, claudication and leg swelling.   Gastrointestinal: Negative for abdominal pain, constipation, diarrhea, heartburn, nausea and vomiting.   Genitourinary: Negative for dysuria, frequency and urgency.   Musculoskeletal: Positive for back pain and neck pain. Negative for joint pain and myalgias.   Skin: Negative for itching and rash.   Neurological: Negative for dizziness, tingling, tremors, sensory change, speech change, focal weakness, weakness and headaches.   Psychiatric/Behavioral: Negative for depression, hallucinations and memory loss. The patient is not nervous/anxious and does not have insomnia.     Physical Exam:  Visit Vitals   ??? BP (!) 141/90   ??? Pulse 88   ??? Ht 5' 6.5" (1.689 m)   ??? Wt 196 lb (88.9 kg)   ??? BMI 31.16 kg/m2     Constitutional: She is oriented to person, place, and time. She appears well-developed and well-nourished.   Cardiovascular: Intact distal pulses.   Musculoskeletal: She exhibits no  edema.   Cervical back: She exhibits decreased range of motion, tenderness and pain.   Lumbar back: She exhibits decreased range of motion and tenderness (worse with lumbar extension). Improved compared to previous    Neurological: She is alert and oriented to person, place, and time. She has normal strength and normal reflexes. She displays no tremor. No cranial nerve deficit or sensory deficit. She exhibits normal muscle tone. Coordination and gait normal.   Spurling negative bilateral. SLR negative bilateral  Skin: Skin is warm and dry. No rash noted. No erythema.   Psychiatric: She has a normal mood and affect. Her speech is normal and behavior is normal. Judgment and thought content normal. Cognition and memory are normal.     Impression:  1. Lumbar pain  2. Lumbar spondylosis  3. Left lower limb radiculopathy  4. Left cervical radiculopathy s/p prior ACDF 2008    Plan:  5. Ongoing follow up with pain management  6. OK to stop gabapentin  7. Norco no more than twice daily as needed for pain  8. Continue with physical activity and weight loss efforts    Will continue to monitor any benefits vs side effects of the medications as prescribed.  The patient has been warned about the risk of operating machinery including driving if impaired in any way by these medications.  The patient also accepts the risks of tolerance, dependency, or addiction related to the prescribed medications.  All questions were answered.  Reevaluation  as planned, or sooner if requested.     Controlled Substances Monitoring: Attestation: The Prescription Monitoring Report for this patient was reviewed today. Garald Braver, MD)  Documentation: Possible medication side effects, risk of tolerance and/or dependence, and alternative treatments discussed, No signs of potential drug abuse or diversion identified. Garald Braver, MD)     Return in about 3 months (around 06/21/2015).     It was my pleasure to evaluate Theresa Vargas today.   Please call with any concerns or questions.  15 minutes spent in evaluation efforts    Garald Braver, MD

## 2015-05-14 ENCOUNTER — Encounter: Payer: PRIVATE HEALTH INSURANCE | Attending: Pulmonary Disease

## 2015-05-14 NOTE — Progress Notes (Deleted)
Sleep Medicine clinic  Follow up for Sleep Apnea    Theresa Vargas                                             Chief Complaint: [name] is here for two month follow up for Obstructive Sleep Apnea with a download.    Theresa TEBBETTS is a 60 y.o.oldfemale came for follow up regarding her sleep apnea. She is currently using her positive airway pressure device with a CPAP pressure of 7cm H20. She denies any problems with machine or mask. She is waking up in the morning with dry mouth. She is sleeping well at night with out difficulty.She denies any daytime sleepiness.      Review of Systems   General/Constitutional: she gained 10 lbs of weight from the last visit with normal appetite. No fever or chills.  HENT: Negative.   Eyes: Negative.  Upper respiratory tract: Frequent nasal stuffiness with post nasal drip. She is currently not using any nasal spray.  Lower respiratory tract/ lungs: No cough or sputum production. No hemoptysis.  Cardiovascular: No palpitations or chest pain.  Gastrointestinal: No nausea or vomiting.  Neurological: No focal neurologiacal weakness.  Extremities: No edema.  Musculoskeletal: No complaints.  Genitourinary: No complaints.  Hematological: Negative.   Psychiatric/Behavioral: Negative.   Skin: No itching.         Past Medical History:   Diagnosis Date   ??? Diabetes mellitus (HCC)    ??? Hypertension    ??? Nausea & vomiting    ??? PONV (postoperative nausea and vomiting)    ??? Psychiatric problem        Past Surgical History:   Procedure Laterality Date   ??? CHOLECYSTECTOMY  2007   ??? COLONOSCOPY      2006   ??? HEMORRHOID SURGERY  05/2010   ??? HYSTERECTOMY  2007   ??? NECK SURGERY  2008 [place] [facility]    ACDF   ??? NERVE BLOCK  10/29/2014    LUMBAR FACET INJECTION   ??? OTHER SURGICAL HISTORY  11/05/10    hemmroidectomy   ??? OTHER SURGICAL HISTORY Bilateral 01/06/2015    Lumbar Facet   ??? SKIN BIOPSY  2007    left cheek   ??? TYMPANOSTOMY TUBE PLACEMENT  2006,2011       Social History    Substance Use Topics   ??? Smoking status: Never Smoker   ??? Smokeless tobacco: Never Used   ??? Alcohol use No       No Known Allergies    Current Outpatient Prescriptions   Medication Sig Dispense Refill   ??? HYDROcodone-acetaminophen (NORCO) 5-325 MG per tablet Take 1 tablet by mouth 2 times daily as needed for Pain  Fill on/after 04/03/2015. 60 tablet 0   ??? HYDROcodone-acetaminophen (NORCO) 5-325 MG per tablet Take 1 tablet by mouth 2 times daily as needed for Pain  Fill on/after 05/04/2015. 60 tablet 0   ??? HYDROcodone-acetaminophen (NORCO) 5-325 MG per tablet Take 1 tablet by mouth 2 times daily as needed for Pain  Fill on/after 06/03/2015. 60 tablet 0   ??? ondansetron (ZOFRAN) 4 MG tablet Take 1 tablet by mouth every 8 hours as needed for Nausea or Vomiting 15 tablet 0   ??? fluticasone (FLONASE) 50 MCG/ACT nasal spray 2 sprays by Nasal route daily 1 Bottle 6   ??? hydrochlorothiazide (HYDRODIURIL)  25 MG tablet Take 1 tablet by mouth daily 90 tablet 3   ??? diltiazem (CARDIZEM CD) 240 MG extended release capsule Take 1 capsule by mouth daily 90 capsule 3   ??? SEROQUEL XR 50 MG XR tablet Take 100 mg by mouth daily     ??? cyclobenzaprine (FLEXERIL) 10 MG tablet Take 10 mg by mouth as needed Indications: as needed      ??? ibuprofen (ADVIL;MOTRIN) 800 MG tablet Take 800 mg by mouth every 6 hours as needed for Pain     ??? VORTIoxetine (BRINTELLIX) 10 MG TABS tablet Take 20 mg by mouth daily      ??? PRISTIQ 100 MG TB24 Take by mouth daily      ??? metformin (GLUCOPHAGE) 1000 MG tablet Take 1,000 mg by mouth Daily with supper.     ??? metformin (GLUCOPHAGE) 500 MG tablet Take 500 mg by mouth daily (with breakfast).     ??? zolpidem (AMBIEN) 10 MG tablet Take 10 mg by mouth nightly Takes every night     ??? lorazepam (ATIVAN) 1 MG tablet Take 1 mg by mouth every 6 hours as needed.         No current facility-administered medications for this visit.        Family History   Problem Relation Age of Onset   ??? Diabetes Other    ??? Hypertension Other     ??? Mental Illness Other    ??? Heart Disease Other    ??? Other Other      bone and joint problems          There were no vitals taken for this visit.     BMI:  There is no height or weight on file to calculate BMI.     Mallampati airway Class: IV  Neck Circumference: 13.5 Inches??  Epworth sleepiness score 01/15/15: 0.      Physical Exam :  Constitutional: Patient appears moderately built and moderately nourished. No distress. Patient is oriented to person, place, and time.  HENT: Hypertrophied nasal turbinates with pale nasal mucosa bilaterally. Deviated nasal septum to right side.  Head: Normocephalic and atraumatic.   Right Ear: External ear normal.   Left Ear: External ear normal.   Mouth/Throat: Oropharynx is clear and moist.   Eyes: Conjunctivae are normal. Pupils are equal and reactive to light. No scleral icterus.   Neck: Neck supple. No JVD present.   Cardiovascular: Normal rate, regular rhythm, normal heart sounds. No murmur heard.   Pulmonary/Chest: Effort normal and breath sounds normal. No stridor. No respiratory distress.  No wheezes. No rales.   Abdominal: Soft. Patient exhibits no distension. No tenderness.   Musculoskeletal: Normal range of motion.   Extremities:Patient exhibits no edema and no tenderness.   Lymphadenopathy:  No cervical adenopathy.   Neurological: Patient is alert and oriented to person, place, and time.   Skin: Skin is warm and dry. Patient is not diaphoretic.   Psychiatric: Patient  has a normal mood and affect.    Diagnostic Data: 09-20-14 AHI 14.3  PAP Download:   Recorded compliance dates: 11-06-14 - 01-08-15  More than 4hour usage compliance was: 63%.  Average residual Apnea- Hypoapnea index on current pressue was: 0.9  ??  PAP Type C PAP Level 7cmH2O   ????  Average usuage hours per day was: 5 Hrs 52 min  Interface: Nasal  ??  Provider: [] [facility]  [x] [facility]  [place] [] [facility]  [] [facility]   [] [facility] [] Other  CPAP study:  PATIENT NAME: Theresa, Vargas A. DOB: [birth year]  MED REC NO:  [id number] ROOM: [facility]   ACCOUNT NO: [id number] ADMISSION DATE: 10/09/2014  PHYSICIAN: Kathlyn Sacramento, M.D.         DATE OF STUDY: 10/09/2014     REPORT TITLE: CPAP titration study report.     IMPRESSION:  1. Mild obstructive sleep apnea. The patient had optimal titration to a CPAP  pressure of 7 cm of water.  2. Decreased and delayed REM sleep.  3. Hypertension.  4. Depression.  5. Chronic back pain, currently on treatment with Norco.  6. Diabetes mellitus.      Assesment:  -Mild obstructive sleep apnea on treatment with a CPAP pressure of 7 cm of water.- Her AHI is under good control with current therapy with significant improvement in clinical symptoms. How ever she is having poor compliance to >4hours  -Decreased and delayed REM sleep.  -Hypertension.  -Insufficient sleep syndrome.  -Depression.  -Chronic back pain, currently on treatment with Norco.  -Diabetes mellitus.  -Allergic rhinitis.  -Deviated nasal septum to right side    Recommendations/Plan:  - Start patient on Flonase 51mcg/INH 2sprays each nostril daily. She  was informed about adverse effects of Flonase. She was demonstrated in my office how to use Flonase. She verbalizes understanding.  -Patient will be given a prescription for chin strap to use with her current mask to avoid leaks and to improve her dryness of mouth.  -She was advised to continue current positive airway pressure therapy with above described pressure.  -She advised to keep good compliance with current recommended pressure to get optimal results and clinical improvement.  -Follow with my clinic in 4months (J) for clinical reevaluation with review of download.  -She was advised to call DME company regarding supplies if needed.  -She was instructed to extend her sleep schedule to 7 to 9 hours in a given 24hour period continuously.  -She was advised to call my office for earlier appointment if needed for worsening of sleep symptoms.  -ARIANNIE PENALOZA was educated about my impression  and plan. Patient verbalizes understanding.

## 2015-05-15 NOTE — Progress Notes (Signed)
This encounter was created in error - please disregard.

## 2015-05-19 ENCOUNTER — Encounter: Payer: PRIVATE HEALTH INSURANCE | Attending: Pulmonary Disease

## 2015-06-04 ENCOUNTER — Encounter: Payer: PRIVATE HEALTH INSURANCE | Attending: Physician Assistant

## 2015-06-23 ENCOUNTER — Ambulatory Visit: Admit: 2015-06-23 | Discharge: 2015-06-23 | Payer: PRIVATE HEALTH INSURANCE | Attending: Nurse Practitioner

## 2015-06-23 DIAGNOSIS — M47816 Spondylosis without myelopathy or radiculopathy, lumbar region: Secondary | ICD-10-CM

## 2015-06-23 MED ORDER — HYDROCODONE-ACETAMINOPHEN 5-325 MG PO TABS
5-325 MG | ORAL_TABLET | Freq: Two times a day (BID) | ORAL | 0 refills | Status: DC | PRN
Start: 2015-06-23 — End: 2015-09-22

## 2015-06-23 NOTE — Patient Instructions (Addendum)
Plantar Fasciitis: Exercises  Your Care Instructions  Here are some examples of typical rehabilitation exercises for your condition. Start each exercise slowly. Ease off the exercise if you start to have pain.  Your doctor or physical therapist will tell you when you can start these exercises and which ones will work best for you.  How to do the exercises  Note: Each exercise should create a pulling feeling but should not cause pain.  Towel stretch    1. Sit with your legs extended and knees straight.  2. Place a towel around your foot just under the toes.  3. Hold each end of the towel in each hand, with your hands above your knees.  4. Pull back with the towel so that your foot stretches toward you.  5. Hold the position for at least 15 to 30 seconds.  6. Repeat 2 to 4 times a session, up to 5 sessions a day.  Calf stretch    Note: This exercise stretches the muscles at the back of the lower leg (the calf) and the Achilles tendon. Do this exercise 3 or 4 times a day, 5 days a week.  1. Stand facing a wall with your hands on the wall at about eye level. Put the leg you want to stretch about a step behind your other leg.  2. Keeping your back heel on the floor, bend your front knee until you feel a stretch in the back leg.  3. Hold the stretch for 15 to 30 seconds. Repeat 2 to 4 times.  Plantar fascia and calf stretch    Note: Stretching the plantar fascia and calf muscles can increase flexibility and decrease heel pain. You can do this exercise several times each day and before and after activity.  1. Stand on a step as shown above. Be sure to hold on to the banister.  2. Slowly let your heels down over the edge of the step as you relax your calf muscles. You should feel a gentle stretch across the bottom of your foot and up the back of your leg to your knee.  3. Hold the stretch about 15 to 30 seconds, and then tighten your calf muscle a little to bring your heel back up to the level of the step. Repeat 2 to  4 times.  Towel curls    1. While sitting, place your foot on a towel on the floor and scrunch the towel toward you with your toes.  2. Then, also using your toes, push the towel away from you.  Note: Make this exercise more challenging by placing a weighted object, such as a soup can, on the other end of the towel.  Marble pickups    1. Put marbles on the floor next to a cup.  2. Using your toes, try to lift the marbles up from the floor and put them in the cup.  Follow-up care is a key part of your treatment and safety. Be sure to make and go to all appointments, and call your doctor if you are having problems. It's also a good idea to know your test results and keep a list of the medicines you take.  Where can you learn more?  Go to [email or web address] and sign in to your MyChart account. Enter X377 in the Search Health Information box to learn more about "Plantar Fasciitis: Exercises."     If you do not have an account, please click on the "Sign Up Now" link.  Current   as of: Jun 24, 2014  Content Version: 11.2  ?? 2006-2017 [employer]. Care instructions adapted under license by [facility]. If you have questions about a medical condition or this instruction, always ask your healthcare professional. [employer] disclaims any warranty or liability for your use of this information.

## 2015-06-23 NOTE — Progress Notes (Signed)
[facility]    Physical Medicine & Rehabilitation  Outpatient progress note    Chief Complaint:   Chief Complaint   Patient presents with   ??? 3 Month Follow-Up     Spondylosis of lumbar region without myelopathy or radiculopathy    ??? Tingling     left leg and lt. foot pain        Subjective: Theresa Vargas is a 60 y.o. female who returns to the office today for further follow up. She is having some increased pain in her left lower extremity. Lumbar injections initially were helpful. She is taking Norco PRN for pain. Gabapentin was stopped due to weight gain. Does not want to try any additional neuropathic medications for fear that they will interact with antidepressants.     She continues to exercise regularly, is having difficulty losing weight.     Patient with low back pain referring into left lower limb. She questioned whether or not she needed MRI lumbar spine completed. She is opposed to surgery at this point. Discussed that there is no benefit to obtaining MRI if she does not want to have surgery. She is agreeable. States that is symptoms continue to worsen, she may consider imaging and depending on results, surgery.     She also complains of some tenderness in her left heel that she has been experiencing over the past month or so. It is sore, and feels swollen. Sometimes extends along the lateral aspect of foot. It bothers her more when exercising, and first thing when getting out of bed in the morning.     Review of Systems:  CONSTITUTIONAL: negative  RESPIRATORY: negative  CARDIOVASCULAR: negative  GASTROINTESTINAL: negative  GENITOURINARY: negative  MUSCULOSKELETAL: positive for back and neck pain  NEUROLOGICAL: positive for pain  BEHAVIOR/PSYCH: negative  10 point system review otherwise negative    Physical Exam:  BP (!) 139/90 (Site: Right Arm, Position: Sitting)   Pulse 101   Ht 5' 6.53" (1.69 m)   Wt 195 lb 15.8 oz (88.9 kg)   BMI 31.13  kg/m2    awake  Orientation:   person, place, time  Mood: within normal limits  Affect: calm  General appearance: Patient is well nourished, well developed, well groomed and in no acute distress    Memory:   normal,   Attention/Concentration: normal  Language:  normal    Cranial Nerves:  cranial nerves II-XII are grossly intact  ROM:  Decreased range of motion in cervical and lumbar region  Diffuse tenderness throughout cervical and lumbar back.  Motor Exam:  Motor exam is symmetrical 5 out of 5 all extremities bilaterally  Tone:  normal  Muscle bulk: within normal limits  Sensory:  Sensory intact  Coordination:   normal  Deep Tendon Reflexes:  Reflexes are intact and symmetrical bilaterally    Skin: warm and dry, no rash or erythema  Peripheral vascular: Pulses: Normal upper and lower extremity pulses; Edema: no      Impression:  ?? Lumbar back pain  ?? Lumbar spondylosis  ?? Left lower limb radiculopathy  ?? Left cervical radiculopathy s/p prior ACDF 2008  ?? Plantar fasciitis    Plan:  ?? Recommended follow up with pain management regarding repeat injections  ?? Exercises given for plantar fasciitis  ?? Norco as needed for pain  ?? Continue with physical activity and weight loss efforts    Return in about 3 months (around 09/23/2015).     It  was my pleasure to evaluate Theresa Vargas today.  Please call with any concerns or questions.  15 minutes spent in evaluation efforts    Rocky Morel, NP

## 2015-07-02 ENCOUNTER — Ambulatory Visit: Admit: 2015-07-02 | Discharge: 2015-07-02 | Payer: PRIVATE HEALTH INSURANCE | Attending: Physician Assistant

## 2015-07-02 DIAGNOSIS — Z9989 Dependence on other enabling machines and devices: Secondary | ICD-10-CM

## 2015-07-02 NOTE — Progress Notes (Signed)
[facility]      Theresa Vargas                                         [id number]  07/02/2015   Chief Complaint   Patient presents with   . Sleep Apnea     OSA 4 mo f/u [facility]        Pt of Dr. Jenetta Downer    PAP Download:   Original or initial  AHI: 14.3     Date of initial study: 09/20/14    [x]  Compliant  80%   []   Noncompliant 20 %     PAP Type CPAP   Level  7   Avg Hrs/Day 6:07  AHI: 1.4   Recorded compliance dates , May 24, 2015  to Jun 22, 2015   Machine/Mfg: ResMed Interface: Nasal    Provider:  [] [facility]  [x] [facility]  [] [facility]  [] [facility]         [] [facility] [] Other:   Neck Size: 13.5  Mallampati Mallampati 4  ESS:  0    Here is a scan of the most recent download:          Presentation:   Theresa Vargas presents for sleep medicine follow up for obstructive sleep apnea  Since the last visit Theresa Vargas has been doing reasonably well with their sleep machine. Other comments: She is doing well with PAP. She gets benefit.      Equipment issues:  The pressure is  acceptable, the mask is acceptable and she  is  using the humidity.    Progress History:   Since last visit any new medical issues? No  New ER or hospitlal visits? No  Any new or changes in medicines? No  Any new sleep medicines? No    Sleep issues:  Do you feel better? Yes  More rested? Yes   Better concentration? yes      Past Medical History:   Diagnosis Date   . Diabetes mellitus (HCC)    . Hypertension    . Nausea & vomiting    . PONV (postoperative nausea and vomiting)    . Psychiatric problem        Past Surgical History:   Procedure Laterality Date   . CHOLECYSTECTOMY  2007   . COLONOSCOPY      2006   . HEMORRHOID SURGERY  05/2010   . HYSTERECTOMY  2007   . NECK SURGERY  2008 [place] [facility]    ACDF   . NERVE BLOCK  10/29/2014    LUMBAR FACET INJECTION   . OTHER SURGICAL HISTORY  11/05/10    hemmroidectomy   . OTHER SURGICAL HISTORY Bilateral 01/06/2015    Lumbar Facet   . SKIN BIOPSY  2007    left cheek   .  TYMPANOSTOMY TUBE PLACEMENT  2006,2011       Social History   Substance Use Topics   . Smoking status: Never Smoker   . Smokeless tobacco: Never Used   . Alcohol use No       No Known Allergies    Current Outpatient Prescriptions   Medication Sig Dispense Refill   . losartan-hydrochlorothiazide (HYZAAR) 100-25 MG per tablet TAKE 1 TABLET BY MOUTH ONE TIME A DAY  11   . busPIRone (BUSPAR) 10 MG tablet TAKE 1 TABLET BY MOUTH THREE TIMES A DAY  1   . ONE TOUCH ULTRA TEST strip CHECK BLOOD SUGAR ONCE DAILY.  3   . HYDROcodone-acetaminophen (NORCO) 5-325 MG per tablet Take 1 tablet by mouth 2 times daily as needed for Pain  Fill on/after 07/04/2015. 60 tablet 0   . HYDROcodone-acetaminophen (NORCO) 5-325 MG per tablet Take 1 tablet by mouth 2 times daily as needed for Pain  Fill on/after 09/03/2015. 60 tablet 0   . HYDROcodone-acetaminophen (NORCO) 5-325 MG per tablet Take 1 tablet by mouth 2 times daily as needed for Pain  Fill on/after 08/03/2015. 60 tablet 0   . ondansetron (ZOFRAN) 4 MG tablet Take 1 tablet by mouth every 8 hours as needed for Nausea or Vomiting 15 tablet 0   . fluticasone (FLONASE) 50 MCG/ACT nasal spray 2 sprays by Nasal route daily 1 Bottle 6   . diltiazem (CARDIZEM CD) 240 MG extended release capsule Take 1 capsule by mouth daily 90 capsule 3   . cyclobenzaprine (FLEXERIL) 10 MG tablet Take 10 mg by mouth as needed Indications: as needed      . ibuprofen (ADVIL;MOTRIN) 800 MG tablet Take 800 mg by mouth every 6 hours as needed for Pain     . VORTIoxetine (BRINTELLIX) 10 MG TABS tablet Take 20 mg by mouth daily      . PRISTIQ 100 MG TB24 Take by mouth daily      . metformin (GLUCOPHAGE) 1000 MG tablet Take 1,000 mg by mouth Daily with supper.     . metformin (GLUCOPHAGE) 500 MG tablet Take 500 mg by mouth daily (with breakfast).     Marland Kitchen zolpidem (AMBIEN) 10 MG tablet Take 10 mg by mouth nightly Takes every night     . lorazepam (ATIVAN) 1 MG tablet Take 1 mg by mouth every 6 hours as needed.         No  current facility-administered medications for this visit.        Family History   Problem Relation Age of Onset   . Diabetes Other    . Hypertension Other    . Mental Illness Other    . Heart Disease Other    . Other Other      bone and joint problems        Review of Systems -   General ROS: stable / unchanged  ENT ROS: negative for - nasal congestion, oral lesions or sore throat  Hematological and Lymphatic ROS: negative  Endocrine ROS: negative  Respiratory ROS: no cough, shortness of breath, or wheezing  Cardiovascular ROS: no chest pain or dyspnea on exertion  Gastrointestinal ROS: no abdominal pain, change in bowel habits, or black or bloody stools  Musculoskeletal ROS: negative  Neurological ROS: negative    Physical Exam:    BMI:  Body mass index is 30.59 kg/(m^2).    Wt Readings from Last 3 Encounters:   07/02/15 192 lb 6.4 oz (87.3 kg)   06/23/15 195 lb 15.8 oz (88.9 kg)   03/24/15 196 lb (88.9 kg)     Vitals: BP 126/86 (Site: Left Arm, Position: Sitting)  Pulse 93  Ht 5' 6.5" (1.689 m)  Wt 192 lb 6.4 oz (87.3 kg)  SpO2 97%  BMI 30.59 kg/m2      General appearance: alert and oriented to person, place and time, well-developed and well-nourished, in no acute distress  Nose: Nares normal. Septum midline. Mucosa normal. No drainage or sinus tenderness.  Oropharynx:  negative  Lungs: clear to auscultation bilaterally  Heart:  regular rate and rhythm, S1, S2 normal, no murmur, click, rub or gallop  Extremities: extremities normal, atraumatic, no cyanosis or edema  Neurologic: Mental status: Alert, oriented, thought content appropriate      ASSESSMENT/DIAGNOSIS    Theresa Vargas is doing well on current PAP settings for obstructive sleep apnea.        Plan   Do you need any equipment today? No    - She  was advised to continue current positive airway pressure therapy with above described pressure.   - She  advised to keep good compliance with current recommended pressure to get optimal results and clinical  improvement  - Recommend 7-9 hours of sleep with PAP  - She was advised to call DME company regarding supplies if needed.   - She call my office for earlier appointment if needed for worsening of sleep symptoms.   - She was instructed on weight loss  - Theresa Vargas was educated about my impression and plan. Patient verbalizes understanding.  We will see Theresa Vargas back in: 1 year with download    Jamie Brookes PA-C, MPAS  07/02/2015

## 2015-09-02 ENCOUNTER — Encounter: Attending: Cardiovascular Disease

## 2015-09-22 ENCOUNTER — Ambulatory Visit
Admit: 2015-09-22 | Discharge: 2015-09-22 | Payer: PRIVATE HEALTH INSURANCE | Attending: Physical Medicine & Rehabilitation

## 2015-09-22 DIAGNOSIS — M5417 Radiculopathy, lumbosacral region: Secondary | ICD-10-CM

## 2015-09-22 MED ORDER — HYDROCODONE-ACETAMINOPHEN 5-325 MG PO TABS
5-325 MG | ORAL_TABLET | Freq: Two times a day (BID) | ORAL | 0 refills | Status: DC | PRN
Start: 2015-09-22 — End: 2015-12-22

## 2015-09-22 NOTE — Progress Notes (Signed)
[facility]    Physical Medicine & Rehabilitation  Outpatient progress note    Chief Complaint:   Chief Complaint   Patient presents with   ??? Follow-up     3 mon f/u        Subjective: Theresa Vargas is a 60 y.o. female who returns to the office today for further follow up. She is having some increased pain in her bilateral lower limbs.  Lumbar injections initially were helpful, then wore off. She is taking Norco PRN for pain. Does not want to try any neuropathic medications for fear that they will interact with antidepressants.     She continues to exercise regularly.  Patient with low back pain referring into bilateral lower limbs described as aching, more at night.  She questioned whether or not she needed MRI lumbar spine completed. Discussed that as symptoms continue to worsen, she may consider imaging and depending on results, other interventions.    Heel pain improved, but not resolved.  Again discussed plantar fasciitis and patient was given exercises and counseling at last visit.      Review of Systems:  CONSTITUTIONAL: negative  RESPIRATORY: negative  CARDIOVASCULAR: negative  GASTROINTESTINAL: negative  GENITOURINARY: negative  MUSCULOSKELETAL: positive for back and neck pain  NEUROLOGICAL: positive for pain  BEHAVIOR/PSYCH: negative  10 point system review otherwise negative    Physical Exam:  BP 133/80 (Site: Right Arm, Position: Sitting, Cuff Size: Large Adult)   Pulse 108   Ht 5\' 6"  (1.676 m)   Wt 194 lb (88 kg)   BMI 31.31 kg/m2    awake  Orientation:   person, place, time  Mood: within normal limits  Affect: calm  General appearance: Patient is well nourished, well developed, well groomed and in no acute distress    Memory:   normal,   Attention/Concentration: normal  Language:  normal    Cranial Nerves:  cranial nerves II-XII are grossly intact  ROM:  Decreased range of motion in cervical and lumbar region  Diffuse tenderness throughout cervical and lumbar  back.  Motor Exam:  Motor exam is symmetrical 5 out of 5 all extremities bilaterally  Tone:  normal  Muscle bulk: within normal limits  Sensory:  Sensory intact  Coordination:   normal  Deep Tendon Reflexes:  Reflexes are intact and symmetrical bilaterally    Skin: warm and dry, no rash or erythema  Peripheral vascular: Pulses: Normal upper and lower extremity pulses; Edema: no      Impression:  ?? Lumbar back pain  ?? Lumbar spondylosis  ?? Left lower limb radiculopathy  ?? Bilateral lower limb pain  ?? Left cervical radiculopathy s/p prior ACDF 2008  ?? Plantar fasciitis    Plan:  ?? MRI lumbar spine without contrast to compare to images of 2014  ?? Recommended follow up with pain management regarding repeat injections  ?? Exercises encouraged for plantar fasciitis  ?? Norco as needed for pain chronically for history of chronic low back pain non controllable with non opiate options  ?? Continue with physical activity and weight loss efforts    Return in about 3 months (around 12/23/2015).     It was my pleasure to evaluate Theresa BuckerSandra A Deery today.  Please call with any concerns or questions.  15 minutes spent in evaluation efforts    Garald BraverMatthew T Glady Ouderkirk, MD

## 2015-09-23 ENCOUNTER — Encounter: Payer: PRIVATE HEALTH INSURANCE | Attending: Physical Medicine & Rehabilitation

## 2015-09-23 ENCOUNTER — Encounter

## 2015-09-23 ENCOUNTER — Inpatient Hospital Stay: Admit: 2015-09-23 | Payer: PRIVATE HEALTH INSURANCE

## 2015-09-23 DIAGNOSIS — Z1231 Encounter for screening mammogram for malignant neoplasm of breast: Secondary | ICD-10-CM

## 2015-09-29 ENCOUNTER — Encounter: Attending: Physical Medicine & Rehabilitation

## 2015-10-01 ENCOUNTER — Encounter

## 2015-10-01 ENCOUNTER — Inpatient Hospital Stay: Admit: 2015-10-01 | Payer: PRIVATE HEALTH INSURANCE

## 2015-10-01 DIAGNOSIS — M5417 Radiculopathy, lumbosacral region: Secondary | ICD-10-CM

## 2015-10-02 NOTE — Telephone Encounter (Signed)
-----   Message from Garald Braver, MD sent at 10/02/2015  9:05 AM EDT -----  Please call patient.  Her MRI of lumbar spine is stable compared to previous exam.  No significant worsening or changes.  We can discuss further at future follow up visits.

## 2015-10-02 NOTE — Telephone Encounter (Signed)
Left message for patient to call the office.

## 2015-10-03 NOTE — Telephone Encounter (Signed)
Patient notified.

## 2015-10-21 ENCOUNTER — Encounter: Payer: PRIVATE HEALTH INSURANCE | Attending: Cardiovascular Disease

## 2015-11-13 ENCOUNTER — Encounter

## 2015-11-13 ENCOUNTER — Inpatient Hospital Stay: Payer: PRIVATE HEALTH INSURANCE

## 2015-11-13 ENCOUNTER — Inpatient Hospital Stay: Admit: 2015-11-13 | Payer: PRIVATE HEALTH INSURANCE

## 2015-11-13 DIAGNOSIS — M4800 Spinal stenosis, site unspecified: Secondary | ICD-10-CM

## 2015-11-13 LAB — BASIC METABOLIC PANEL
BUN: 23 mg/dL — ABNORMAL HIGH (ref 7–22)
CO2: 26 meq/L (ref 23–33)
Calcium: 9.2 mg/dL (ref 8.5–10.5)
Chloride: 96 meq/L — ABNORMAL LOW (ref 98–111)
Creatinine: 0.7 mg/dL (ref 0.4–1.2)
Glucose: 189 mg/dL — ABNORMAL HIGH (ref 70–108)
Potassium: 3.8 meq/L (ref 3.5–5.2)
Sodium: 138 meq/L (ref 135–145)

## 2015-11-13 LAB — CBC
Hematocrit: 41.9 % (ref 37.0–47.0)
Hemoglobin: 13.9 gm/dl (ref 12.0–16.0)
MCH: 28.8 pg (ref 27.0–31.0)
MCHC: 33.3 gm/dl (ref 33.0–37.0)
MCV: 86.5 fL (ref 81.0–99.0)
MPV: 6.9 mcm — ABNORMAL LOW (ref 7.4–10.4)
Platelets: 468 10*3/uL — ABNORMAL HIGH (ref 130–400)
RBC: 4.84 10*6/uL (ref 4.20–5.40)
RDW: 13.7 % (ref 11.5–14.5)
WBC: 9.4 10*3/uL (ref 4.8–10.8)

## 2015-11-13 LAB — ANION GAP: Anion Gap: 16 meq/L (ref 8.0–16.0)

## 2015-11-13 LAB — GLOMERULAR FILTRATION RATE, ESTIMATED: Est, Glom Filt Rate: 86 mL/min/{1.73_m2} — AB

## 2015-11-13 LAB — APTT: aPTT: 31.1 seconds (ref 22.0–38.0)

## 2015-11-13 LAB — PROTIME-INR: INR: 0.93 (ref 0.85–1.13)

## 2015-11-14 LAB — EKG 12-LEAD
Atrial Rate: 67 {beats}/min
P Axis: 41 degrees
P-R Interval: 180 ms
Q-T Interval: 400 ms
QRS Duration: 82 ms
QTc Calculation (Bazett): 422 ms
R Axis: 36 degrees
T Axis: 10 degrees
Ventricular Rate: 67 {beats}/min

## 2015-12-22 ENCOUNTER — Ambulatory Visit: Admit: 2015-12-22 | Discharge: 2015-12-22 | Payer: PRIVATE HEALTH INSURANCE | Attending: Nurse Practitioner

## 2015-12-22 ENCOUNTER — Encounter: Attending: Nurse Practitioner

## 2015-12-22 DIAGNOSIS — M5417 Radiculopathy, lumbosacral region: Secondary | ICD-10-CM

## 2015-12-22 MED ORDER — HYDROCODONE-ACETAMINOPHEN 5-325 MG PO TABS
5-325 | ORAL_TABLET | Freq: Two times a day (BID) | ORAL | 0 refills | Status: DC | PRN
Start: 2015-12-22 — End: 2016-01-06

## 2015-12-22 MED ORDER — HYDROCODONE-ACETAMINOPHEN 5-325 MG PO TABS
5-325 | ORAL_TABLET | Freq: Two times a day (BID) | ORAL | 0 refills | Status: DC | PRN
Start: 2015-12-22 — End: 2016-03-22

## 2015-12-22 NOTE — Progress Notes (Signed)
[facility]    Physical Medicine & Rehabilitation  Outpatient progress note    Chief Complaint:   Chief Complaint   Patient presents with   ??? Follow-up     43mo f/u        Subjective: Theresa Vargas is a 60 y.o. female who returns to the office today for further follow up. Pain in her lower back and lower limbs is stable. She continues to use Norco PRN for pain which is effective. She continues to have some issues with plantar fasciitis, however is overall improving.     Review of Systems:  CONSTITUTIONAL: negative  RESPIRATORY: negative  CARDIOVASCULAR: negative  GASTROINTESTINAL: negative  GENITOURINARY: negative  MUSCULOSKELETAL: positive for back and neck pain  NEUROLOGICAL: positive for pain  BEHAVIOR/PSYCH: negative  10 point system review otherwise negative    Physical Exam:  BP 138/84    Pulse 92    Ht 5\' 6"  (1.676 m)    Wt 194 lb (88 kg)    BMI 31.31 kg/m??     awake  Orientation:   person, place, time  Mood: within normal limits  Affect: calm  General appearance: Patient is well nourished, well developed, well groomed and in no acute distress    Memory:   normal,   Attention/Concentration: normal  Language:  normal    Cranial Nerves:  cranial nerves II-XII are grossly intact  ROM:  Decreased range of motion in cervical and lumbar region  Diffuse tenderness throughout cervical and lumbar back.  Motor Exam:  Motor exam is symmetrical 5 out of 5 all extremities bilaterally  Tone:  normal  Muscle bulk: within normal limits  Sensory:  Sensory intact  Coordination:   normal  Deep Tendon Reflexes:  Reflexes are intact and symmetrical bilaterally    Skin: warm and dry, no rash or erythema  Peripheral vascular: Pulses: Normal upper and lower extremity pulses; Edema: no      Impression:  ?? Lumbar back pain  ?? Lumbar spondylosis  ?? Left lower limb radiculopathy  ?? Left cervical radiculopathy s/p prior ACDF 2008  ?? Plantar fasciitis    Plan:  ?? Ongoing follow up with pain  management, patient does not wish to receive injections at this time  ?? Norco as needed for pain  ?? Continue with physical activity and weight loss efforts    Will continue to monitor any benefits vs side effects of the medications as prescribed.  The patient has been warned about the risk of operating machinery including driving if impaired in any way by these medications.  The patient also accepts the risks of tolerance, dependency, or addiction related to the prescribed medications.  All questions were answered.  Reevaluation as planned, or sooner if requested.     Controlled Substances Monitoring: Attestation: The Prescription Monitoring Report for this patient was reviewed today. Rocky Morel(Elon Eoff N Jaisa Defino, NP)  Documentation: Possible medication side effects, risk of tolerance and/or dependence, and alternative treatments discussed., No signs of potential drug abuse or diversion identified. Rocky Morel(Abdelrahman Nair N Darby Shadwick, NP)    Return in about 3 months (around 03/23/2016).     It was my pleasure to evaluate Theresa Vargas today.  Please call with any concerns or questions.  15 minutes spent in evaluation efforts    Rocky MorelBrittany N Jamyiah Labella, NP

## 2016-01-06 ENCOUNTER — Ambulatory Visit: Admit: 2016-01-06 | Discharge: 2016-01-06 | Payer: PRIVATE HEALTH INSURANCE | Attending: Cardiovascular Disease

## 2016-01-06 DIAGNOSIS — I10 Essential (primary) hypertension: Secondary | ICD-10-CM

## 2016-01-06 MED ORDER — DILTIAZEM HCL ER COATED BEADS 240 MG PO CP24
240 | ORAL_CAPSULE | Freq: Every day | ORAL | 11 refills | Status: DC
Start: 2016-01-06 — End: 2018-04-27

## 2016-01-06 NOTE — Progress Notes (Signed)
Pt here for check up     Pt denies chest pain, dizziness, sob, peripheral edema, heart palpitations

## 2016-01-06 NOTE — Progress Notes (Signed)
Theresa Vargas is a 60 y.o. female is here for  risk factor modification no angina or chest pain and has had no chest pains .  Intensity of the pain none provocation of the pain none  what relieves the painnone  where does  the pain migrates to none  and no nausea no fever and chills and no sob with and without activity. No evidence of swelling in legs no palpitations and no syncopal episodes and no dizziness ,no bleeding ,or urination  Problems ,no double visions ,no speech problems and no bowel problems or diarrhea and tolerating medications     Patient Active Problem List   Diagnosis   ??? OM (otitis media)   ??? ETD (eustachian tube dysfunction)   ??? Dizziness and giddiness   ??? Anal skin tag   ??? Tinnitus of both ears   ??? Chronic sinusitis   ??? Acute URI   ??? Hypertrophy of nasal turbinates   ??? Nasal septal deviation   ??? Status post myringotomy with insertion of tube   ??? Eustachian tube dysfunction   ??? Otitis media   ??? Heart palpitations   ??? Essential hypertension   ??? OSA on CPAP     Past Medical History:   Diagnosis Date   ??? Diabetes mellitus ([place])    ??? Hypertension    ??? Nausea & vomiting    ??? PONV (postoperative nausea and vomiting)    ??? Psychiatric problem    ??? Sleep apnea      Social History   Substance Use Topics   ??? Smoking status: Never Smoker   ??? Smokeless tobacco: Never Used   ??? Alcohol use No     No Known Allergies  Current Outpatient Prescriptions   Medication Sig Dispense Refill   ??? brexpiprazole (REXULTI) 0.25 MG TABS tablet Take 0.25 mg by mouth daily     ??? pravastatin (PRAVACHOL) 10 MG tablet Take 10 mg by mouth daily     ??? Coenzyme Q10 (CO Q-10) 100 MG CAPS Take by mouth daily     ??? vitamin D (CHOLECALCIFEROL) 1000 UNIT TABS tablet Take 1,000 Units by mouth daily     ??? HYDROcodone-acetaminophen (NORCO) 5-325 MG per tablet Take 1 tablet by mouth 2 times daily as needed for Pain  Fill on/after 02/02/2016. 60 tablet 0   ??? losartan-hydrochlorothiazide (HYZAAR) 100-25 MG per tablet TAKE 1 TABLET BY MOUTH ONE  TIME A DAY  11   ??? busPIRone (BUSPAR) 10 MG tablet TAKE 1 TABLET BY MOUTH THREE TIMES A DAY  1   ??? ONE TOUCH ULTRA TEST strip CHECK BLOOD SUGAR ONCE DAILY.  3   ??? diltiazem (CARDIZEM CD) 240 MG extended release capsule Take 1 capsule by mouth daily 90 capsule 3   ??? ibuprofen (ADVIL;MOTRIN) 800 MG tablet Take 800 mg by mouth every 6 hours as needed for Pain     ??? VORTIoxetine (BRINTELLIX) 10 MG TABS tablet Take 20 mg by mouth daily      ??? PRISTIQ 100 MG TB24 Take by mouth daily      ??? metformin (GLUCOPHAGE) 1000 MG tablet Take 1,000 mg by mouth Daily with supper.     ??? metformin (GLUCOPHAGE) 500 MG tablet Take 500 mg by mouth daily (with breakfast).     ??? zolpidem (AMBIEN) 10 MG tablet Take 10 mg by mouth nightly Takes every night     ??? lorazepam (ATIVAN) 1 MG tablet Take 1 mg by mouth every 6 hours  as needed.         No current facility-administered medications for this visit.        REVIEW OF SYSTEM  Constitutional  symptoms such as fever chills and malaise and weakness  Are negative   HE ENT negative  HEART all negative  LUNGS all negative   ABDOMEN all negative  GENITAL URINARY all within normal limits  NEUROLOGY all negative  NECK all negative   SKIN all negative  MUSCULOSKELETAL negative  HEMATOLOGICAL negative  PSYCHIATRIC  negative    Vitals:    01/06/16 1020   BP: 124/64   Pulse: 84   Weight: 199 lb (90.3 kg)   Height: 5' 6.5" (1.689 m)       EXAMINATION   Gen. Appearance is reflective of nutritional normal nutrition and well-groomed   Appears  stated age    Carotid the amplitude of  carotid artery  And up stroke are present or diminished and bruits are absent   Thyroid palpation appears to be  Normal    PERLA  And evaluated and within normal limits  And size of pupils is normal  HEENT  teeth appears to be symmetrical without poor dentition and gums  and palate appears to be healthy and  head is normal cephalic  Without signs of trauma and eyes are without trauma no conjunctiva and Iids retracting and not  retracted ptosis and no ptosis and without  erythematous changes and  Nose is symmetrical  without drainage  and ears are  without tinnitus  and throat is clear   JUGULAR VEINS  are not elevated and tongue is mid line no masses presence and no cannon A waves present   LYMPHATICS are without adenopathies at least on exam   CHEST  No biventricular heaves and thrills and no right ventricular heaves and examination without signs of trauma and lesions  HEART not distant and regular rate and rhythm without   gallop signs and and no murmurs and systolic crescendo  Decrescendo  normal s1 and s2 sounds and no s3 and s4 split   Point of maximum intensity of the heartbeat  is at or displaced from the fifth intercostal space  LUNGS no   presence of intercostal retractions and no  use of the accessory muscles with a diaphragmatic movement present and not present pectus and pigeon chest and without wheezes and rhonchi and non diminished in all posterior lungs  and without rales  ABDOMEN abdominal bruit not present  And  No  Presency of  morbid obesity and with no    non distended without organomegaly and no rebound tenderness and bowel sound are present  all quadrants   NEUROLOGY all the cranial nerves are intact and no motor deficits  and no sensory deficits  MUSCULAR SKELETAL without signs of trauma and kyphosis and scoliosis and normal range of motion for all extremities  INTEGUMENTARY without tenting and skin rashes indentation and  dryness  NECK without lymph adenopathy   NEUROPSYCHIATRIC has no anxiety, no mood swings and depression  VASCULAR EXAM for carotid ,radial femoral arteries are  steady  and not diminished   Extremities no evidence of peripheral edema  And pulses are  normal    Assessment and plans  1. Essential hypertension     2. Heart palpitations        patient was advised of the side effects of the medications and watch for any change in medical status if any of those side  effects develop to call immediately  and may consider  discontinuing the medicine after talking to [place] and  depending on the clinical scenario    Patient is a advised to have their lipids and liver panel and TSH checked through their family doctors and the therapeutic values that need to be met are also presented to the patient and need to achieve them is emphasized and patient agrees and accepts.       We Re assured  And educated the  patient  On their  medical status  And the treatment plans  And the patient  is willing  to be complaint with care  And follow up and  All their question  answered to their  Satisfaction    Patient was advised to return in 6 to 10 months for follow up or sooner if there is any change in care.    Electronically signed by Catalina Lunger, DO on 01/06/2016 at 10:46 AM

## 2016-02-03 ENCOUNTER — Encounter: Payer: PRIVATE HEALTH INSURANCE | Attending: Cardiovascular Disease

## 2016-03-22 ENCOUNTER — Ambulatory Visit: Admit: 2016-03-22 | Discharge: 2016-03-22 | Payer: PRIVATE HEALTH INSURANCE | Attending: Nurse Practitioner

## 2016-03-22 DIAGNOSIS — M5417 Radiculopathy, lumbosacral region: Secondary | ICD-10-CM

## 2016-03-22 MED ORDER — HYDROCODONE-ACETAMINOPHEN 5-325 MG PO TABS
5-325 | ORAL_TABLET | Freq: Two times a day (BID) | ORAL | 0 refills | Status: AC | PRN
Start: 2016-03-22 — End: 2016-04-21

## 2016-03-22 MED ORDER — HYDROCODONE-ACETAMINOPHEN 5-325 MG PO TABS
5-325 MG | ORAL_TABLET | Freq: Two times a day (BID) | ORAL | 0 refills | Status: AC | PRN
Start: 2016-03-22 — End: 2016-04-21

## 2016-03-22 NOTE — Progress Notes (Signed)
[facility]    Physical Medicine & Rehabilitation  Outpatient progress note    Chief Complaint:   Chief Complaint   Patient presents with   ??? 3 Month Follow-Up        Subjective: Theresa Vargas is a 61 y.o. female who returns to the office today for further follow up. Pain in her lower back and lower limbs is stable. She continues to use Norco PRN for pain which is effective. She reports she has been running on the treadmill recently. Has noticed some of her back pain increasing, as well as a fluttering sensation on the top of her right foot. Discussed this is most likely from her back, and she will need to take it easy on the running so she does not increase her back pain.     Review of Systems:  CONSTITUTIONAL: negative  RESPIRATORY: negative  CARDIOVASCULAR: negative  GASTROINTESTINAL: negative  GENITOURINARY: negative  MUSCULOSKELETAL: positive for back and neck pain  NEUROLOGICAL: positive for pain  BEHAVIOR/PSYCH: negative  10 point system review otherwise negative    Physical Exam:  BP 139/85 (Site: Right Arm, Position: Sitting)    Pulse 97    Ht 5' 6.5" (1.689 m)    Wt 199 lb 1.2 oz (90.3 kg)    BMI 31.65 kg/m??     awake  Orientation:   person, place, time  Mood: within normal limits  Affect: calm  General appearance: Patient is well nourished, well developed, well groomed and in no acute distress    Memory:   normal,   Attention/Concentration: normal  Language:  normal    Cranial Nerves:  cranial nerves II-XII are grossly intact  ROM:  Decreased range of motion in cervical and lumbar region  Diffuse tenderness throughout cervical and lumbar back.  Motor Exam:  Motor exam is symmetrical 5 out of 5 all extremities bilaterally  Tone:  normal  Muscle bulk: within normal limits  Sensory:  Abnormal sensation on top of left foot  Coordination:   normal  Deep Tendon Reflexes:  Reflexes are intact and symmetrical bilaterally    Skin: warm and dry, no rash or  erythema  Peripheral vascular: Pulses: Normal upper and lower extremity pulses; Edema: no      Impression:  ?? Lumbar back pain  ?? Lumbar spondylosis  ?? Left lower limb radiculopathy  ?? Left cervical radiculopathy s/p prior ACDF 2008  ?? Plantar fasciitis    Plan:  ?? Ongoing follow up with pain management, patient does not wish to receive injections at this time  ?? Norco as needed for pain- refills given  ?? Continue with physical activity and weight loss efforts    Will continue to monitor any benefits vs side effects of the medications as prescribed.  The patient has been warned about the risk of operating machinery including driving if impaired in any way by these medications.  The patient also accepts the risks of tolerance, dependency, or addiction related to the prescribed medications.  All questions were answered.  Reevaluation as planned, or sooner if requested.     Controlled Substances Monitoring: Attestation: The Prescription Monitoring Report for this patient was reviewed today. Rocky Morel, NP)  Documentation: Possible medication side effects, risk of tolerance/dependence & alternative treatments discussed., No signs of potential drug abuse or diversion identified. Rocky Morel, NP)    Return in about 3 months (around 06/19/2016).     It was my pleasure to evaluate Theresa  A Vargas today.  Please call with any concerns or questions.  15 minutes spent in evaluation efforts    Rocky MorelBrittany N Kyleena Scheirer, NP

## 2016-06-22 ENCOUNTER — Ambulatory Visit: Admit: 2016-06-22 | Discharge: 2016-06-22 | Payer: PRIVATE HEALTH INSURANCE | Attending: Nurse Practitioner

## 2016-06-22 DIAGNOSIS — M5417 Radiculopathy, lumbosacral region: Secondary | ICD-10-CM

## 2016-06-22 MED ORDER — HYDROCODONE-ACETAMINOPHEN 5-325 MG PO TABS
5-325 | ORAL_TABLET | Freq: Two times a day (BID) | ORAL | 0 refills | Status: DC
Start: 2016-06-22 — End: 2016-07-19

## 2016-06-22 MED ORDER — HYDROCODONE-ACETAMINOPHEN 5-325 MG PO TABS
5-325 MG | ORAL_TABLET | Freq: Two times a day (BID) | ORAL | 0 refills | Status: DC | PRN
Start: 2016-06-22 — End: 2016-07-19

## 2016-06-22 NOTE — Progress Notes (Signed)
[facility]    Physical Medicine & Rehabilitation  Outpatient progress note    Chief Complaint:   Chief Complaint   Patient presents with   . 3 Month Follow-Up     Radiculopathy, lumbosacral region        Subjective: Theresa Vargas is a [age] y.o. female who returns to the office today for further follow up. Pain in her lower back and lower limbs is stable. She continues to use Norco PRN for pain which is effective. She still gets a "fluttering" sensation on her right foot at times. Continues to try to stay active.     Review of Systems:  CONSTITUTIONAL: negative  RESPIRATORY: negative  CARDIOVASCULAR: negative  GASTROINTESTINAL: negative  GENITOURINARY: negative  MUSCULOSKELETAL: positive for back and neck pain  NEUROLOGICAL: positive for pain  BEHAVIOR/PSYCH: negative  10 point system review otherwise negative    Physical Exam:  BP (!) 137/93 (Site: Right Arm, Position: Sitting)   Pulse 108   Ht 5' 6.5" (1.689 m)   Wt 199 lb 1.2 oz (90.3 kg)   BMI 31.65 kg/m     awake  Orientation:   person, place, time  Mood: within normal limits  Affect: calm  General appearance: Patient is well nourished, well developed, well groomed and in no acute distress    Memory:   normal,   Attention/Concentration: normal  Language:  normal    Cranial Nerves:  cranial nerves II-XII are grossly intact  ROM:  Decreased range of motion in cervical and lumbar region  Diffuse tenderness throughout cervical and lumbar back.  Motor Exam:  Motor exam is symmetrical 5 out of 5 all extremities bilaterally  Tone:  normal  Muscle bulk: within normal limits  Sensory:  Abnormal sensation on top of left foot  Coordination:   normal  Deep Tendon Reflexes:  Reflexes are intact and symmetrical bilaterally    Skin: warm and dry, no rash or erythema  Peripheral vascular: Pulses: Normal upper and lower extremity pulses; Edema: no      Impression:   Lumbar back pain   Lumbar spondylosis   Left lower limb  radiculopathy   Left cervical radiculopathy s/p prior ACDF 2008   Plantar fasciitis    Plan:   Ongoing follow up with pain management, patient does not wish to receive injections at this time   Norco as needed for pain- refills given   Continue with physical activity and weight loss efforts    Will continue to monitor any benefits vs side effects of the medications as prescribed.  The patient has been warned about the risk of operating machinery including driving if impaired in any way by these medications.  The patient also accepts the risks of tolerance, dependency, or addiction related to the prescribed medications.  All questions were answered.  Reevaluation as planned, or sooner if requested.     Controlled Substances Monitoring: Attestation: The Prescription Monitoring Report for this patient was reviewed today. Marland Kitchen(Rocky MorelBrittany N Jasper Ruminski, APRN - CNP)  Documentation: Possible medication side effects, risk of tolerance/dependence & alternative treatments discussed., No signs of potential drug abuse or diversion identified., Random urine drug screen sent today. Marland Kitchen(Rocky MorelBrittany N Charlayne Vultaggio, APRN - CNP)    Return in about 3 months (around 09/22/2016).     It was my pleasure to evaluate Theresa Vargas today.  Please call with any concerns or questions.  15 minutes spent in evaluation efforts    Rocky MorelBrittany N Chavis Tessler, APRN -  CNP

## 2016-06-30 ENCOUNTER — Encounter: Attending: Physician Assistant

## 2016-07-07 ENCOUNTER — Ambulatory Visit: Admit: 2016-07-07 | Discharge: 2016-07-07 | Payer: PRIVATE HEALTH INSURANCE | Attending: Physician Assistant

## 2016-07-07 DIAGNOSIS — Z9989 Dependence on other enabling machines and devices: Secondary | ICD-10-CM

## 2016-07-07 NOTE — Progress Notes (Signed)
[facility]      JENCY SCHNIEDERS                                         [id number]  07/07/2016   Chief Complaint   Patient presents with   . Sleep Apnea     OSA 1 yr f/u [facility]        Pt of Dr. Jenetta Downer    PAP Download:   Original or initial  AHI: 14.3     Date of initial study: 09/20/14    [x]  Compliant  93%   []   Noncompliant 7 %     PAP Type CPAP   Level  7   Avg Hrs/Day 8:16  AHI: 1.1   Recorded compliance dates , Jun 06, 2016  to July 05, 2016   Machine/Mfg: ResMed Interface:  Nasal    Provider:  [] [facility]  [x] Christoper Allegra  [] [employer]  [] [facility]         [] [facility] [] Other:   Neck Size: 13.5  Mallampati Mallampati 4  ESS:  0    Here is a scan of the most recent download:              Presentation:   Erynne presents for sleep medicine follow up for obstructive sleep apnea  Since the last visit, Florean is doing reasonably well with their sleep machine. Other comments: She is doing well with PAP.  Occ dry mouth.    Equipment issues:  The pressure is  acceptable, the mask is acceptable and she  is  using the humidity.    Sleep issues:  Do you feel better? Yes  More rested? Yes   Better concentration? yes    Progress History:   Since last visit any new medical issues? Yes back surgery  New ER or hospitlal visits? No  Any new or changes in medicines? No  Any new sleep medicines? No        Past Medical History:   Diagnosis Date   . Diabetes mellitus (HCC)    . Hypertension    . Nausea & vomiting    . PONV (postoperative nausea and vomiting)    . Psychiatric problem    . Sleep apnea        Past Surgical History:   Procedure Laterality Date   . [place] SUR[place]   . CHOLECYSTECTOMY  2007   . COLONOSCOPY      2006   . HEMORRHOID SURGERY  05/2010   . HYSTERECTOMY  2007   . NECK SURGERY  2008 [facility]    ACDF   . NERVE BLOCK  10/29/2014    LUMBAR FACET INJECTION   . OTHER SURGICAL HISTORY  11/05/10    hemmroidectomy   . OTHER SURGICAL HISTORY Bilateral 01/06/2015     Lumbar Facet   . SKIN BIOPSY  2007    left cheek   . TYMPANOSTOMY TUBE PLACEMENT  2006,2011       Social History   Substance Use Topics   . Smoking status: Never Smoker   . Smokeless tobacco: Never Used   . Alcohol use No       No Known Allergies    Current Outpatient Prescriptions   Medication Sig Dispense Refill   . DULoxetine (CYMBALTA) 60 MG extended release capsule Take 60 mg  by mouth daily     . DULoxetine (CYMBALTA) 30 MG extended release capsule Take 30 mg by mouth daily     . HYDROcodone-acetaminophen (NORCO) 5-325 MG per tablet Take 1 tablet by mouth 2 times daily for 30 days. Fill on/after 07/02/2016. 60 tablet 0   . HYDROcodone-acetaminophen (NORCO) 5-325 MG per tablet Take 1 tablet by mouth 2 times daily as needed for Pain for up to 30 days. Fill on/after 08/01/2016. 60 tablet 0   . HYDROcodone-acetaminophen (NORCO) 5-325 MG per tablet Take 1 tablet by mouth 2 times daily as needed for Pain for up to 30 days. Fill on/after 09/01/2016. 60 tablet 0   . diltiazem (CARDIZEM CD) 240 MG extended release capsule Take 1 capsule by mouth daily 30 capsule 11   . pravastatin (PRAVACHOL) 10 MG tablet Take 10 mg by mouth daily     . Coenzyme Q10 (CO Q-10) 100 MG CAPS Take by mouth daily     . losartan-hydrochlorothiazide (HYZAAR) 100-25 MG per tablet TAKE 1 TABLET BY MOUTH ONE TIME A DAY  11   . ONE TOUCH ULTRA TEST strip CHECK BLOOD SUGAR ONCE DAILY.  3   . ibuprofen (ADVIL;MOTRIN) 800 MG tablet Take 800 mg by mouth every 6 hours as needed for Pain     . VORTIoxetine (BRINTELLIX) 10 MG TABS tablet Take 20 mg by mouth daily      . metformin (GLUCOPHAGE) 1000 MG tablet Take 1,000 mg by mouth Daily with supper.     . metformin (GLUCOPHAGE) 500 MG tablet Take 500 mg by mouth daily (with breakfast).     Marland Kitchen. zolpidem (AMBIEN) 10 MG tablet Take 10 mg by mouth nightly Takes every night     . lorazepam (ATIVAN) 1 MG tablet Take 1 mg by mouth every 6 hours as needed.         No current facility-administered medications for this  visit.        Family History   Problem Relation Age of Onset   . Diabetes Other    . Hypertension Other    . Mental Illness Other    . Heart Disease Other    . Other Other      bone and joint problems        Review of Systems -   Review of Systems   Constitutional: Negative.  Negative for chills and fever.   HENT: Negative.  Negative for congestion, nosebleeds, sinus pain and sore throat.    Eyes: Negative.    Respiratory: Negative.  Negative for cough, sputum production, shortness of breath and wheezing.    Cardiovascular: Negative.  Negative for chest pain, orthopnea and PND.   Gastrointestinal: Negative.  Negative for heartburn and nausea.   Genitourinary: Negative.    Musculoskeletal: Negative.  Negative for joint pain and myalgias.   Skin: Negative.    Neurological: Negative.  Negative for dizziness, weakness and headaches.   Endo/Heme/Allergies: Negative.    Psychiatric/Behavioral: Negative.  Negative for depression. The patient is not nervous/anxious and does not have insomnia.    All other systems reviewed and are negative.       Physical Exam:    BMI:  Body mass index is 31.42 kg/m.    Wt Readings from Last 3 Encounters:   07/07/16 197 lb 9.6 oz (89.6 kg)   06/22/16 199 lb 1.2 oz (90.3 kg)   03/22/16 199 lb 1.2 oz (90.3 kg)     Vitals: BP 122/74 (Site: Left Arm, Position:  Sitting)   Pulse 107   Ht 5' 6.5" (1.689 m)   Wt 197 lb 9.6 oz (89.6 kg)   SpO2 94%   BMI 31.42 kg/m       Physical Exam   Constitutional: She is oriented to person, place, and time and well-developed, well-nourished, and in no distress. No distress.   HENT:   Head: Normocephalic and atraumatic.   Mouth/Throat: Oropharynx is clear and moist. No oropharyngeal exudate.   Eyes: Pupils are equal, round, and reactive to light.   Neck: Normal range of motion. Neck supple.   Cardiovascular: Normal rate, regular rhythm and normal heart sounds.    Pulmonary/Chest: Effort normal and breath sounds normal. No stridor. No respiratory distress.    Abdominal: Soft. Bowel sounds are normal.   Musculoskeletal: Normal range of motion. She exhibits no edema.   Neurological: She is alert and oriented to person, place, and time. Gait normal.   Skin: Skin is warm and dry. She is not diaphoretic.   Psychiatric: Mood, memory, affect and judgment normal.         ASSESSMENT/DIAGNOSIS     Diagnosis Orders   1. OSA on CPAP              Plan   Do you need any equipment today? Yes update supplies    - She  was advised to continue current positive airway pressure therapy with above described pressure.   - She  advised to keep good compliance with current recommended pressure to get optimal results and clinical improvement  - Recommend 7-9 hours of sleep with PAP  - She was advised to call DME company regarding supplies if needed.   - She call my office for earlier appointment if needed for worsening of sleep symptoms.   - She was instructed on weight loss  - Chayanne was educated about my impression and plan. Patient verbalizes understanding.  We will see LEETTA HENDRIKS back in: 1 year with download    Jamie Brookes PA-C, MPAS  07/07/2016

## 2016-07-12 ENCOUNTER — Encounter

## 2016-07-14 ENCOUNTER — Inpatient Hospital Stay: Admit: 2016-07-14 | Discharge: 2016-07-14 | Payer: PRIVATE HEALTH INSURANCE | Attending: Audiologist

## 2016-07-14 DIAGNOSIS — H9012 Conductive hearing loss, unilateral, left ear, with unrestricted hearing on the contralateral side: Secondary | ICD-10-CM

## 2016-07-14 NOTE — Progress Notes (Signed)
ACCOUNT #: [id number]      AUDIOLOGICAL EVALUATION      REASON FOR TESTING:  Patient notes a plugged sensation and hearing loss in the left ear.    OTOSCOPY:  Narrow auditory canal, right. Dulled tympanic membrane, left.     AUDIOGRAM            PURE TONES     RE    LE     [x]    [x]  WNL        []    [x]  Mild    []    [x]  Moderate       []    []  Mod-Severe   []    []  Severe    []    []  Profound    TYPE     RE    LE    []    []  SNHL    []    [x]   Conductive HL    []    []  Mixed HL      CONFIGURATION    RE    LE    []    [x]  Essentially Flat     []    []   Sloping  []    []  Steeply Sloping  []    []   Rising  []    []  Cookie Bite    SPEECH AUDIOMETRY   Right Left Sound Field Aided   PTA 20 28     SRT 15 30     SAT       MASKING       % WRS   QUIET 96 100      35sl 35sl     %WRS   NOISE              MCL       UCL            Live Voice  [x]      Recorded  []      List   []      WORD RECOGNITION   RE    LE  [x]    [x]   Excellent    []    []   Good  []    []  Fair  []    []  Poor  []    []  Very Poor    TYMPANOGRAMS  RE    LE  [x]    []   WNL    []    []   WNL w/reduced mobility  []    []  WNL w/hyper mobility  []    []  Negative pressure  []    [x]  Flat w/normal ECV  []    []  Flat w/large ECV  []    []  Patent PE tube  []    []  Non-Patent PE tube  []    []  Could Not Test    DISTORTION PRODUCT OTOACOUSTIC EMISSIONS SCREENING    Right Ear     []  Passed     []  Refer     [x]  Did Not Test  Left Ear        []  Passed     []  Refer     [x]  Did Not Test      COMMENTS:  NA      RECOMMENDATION(S):    1.)  Medical management due to the abnormal middle ear function and conductive component, left.   2.)  Post medical management, a reevaluation could be completed.

## 2016-07-15 ENCOUNTER — Ambulatory Visit: Admit: 2016-07-15 | Discharge: 2016-07-15 | Payer: PRIVATE HEALTH INSURANCE | Attending: Otolaryngology

## 2016-07-15 DIAGNOSIS — H6592 Unspecified nonsuppurative otitis media, left ear: Secondary | ICD-10-CM

## 2016-07-15 NOTE — Progress Notes (Signed)
Subjective:      Patient ID: Theresa Vargas is a 61 y.o. female. Patient here for fluid in ear per Dr Seleta Rhymes. She will be flying. Audiogram done yesterday.     HPI: Diagnosed as having fluid in the left ear since July following  An URTI.   Has had tubes twice in that ear. Is flying in a month.     Review of Systems   Constitutional: Negative for activity change, appetite change, chills, diaphoresis, fatigue, fever and unexpected weight change.   HENT: Positive for hearing loss and tinnitus. Negative for congestion, dental problem, drooling, ear discharge, ear pain, facial swelling, mouth sores, nosebleeds, postnasal drip, rhinorrhea, sinus pressure, sneezing, sore throat, trouble swallowing and voice change.    Eyes: Negative for photophobia, pain, discharge, redness, itching and visual disturbance.   Respiratory: Negative for apnea, cough, choking, chest tightness, shortness of breath, wheezing and stridor.    Cardiovascular: Negative for chest pain, palpitations and leg swelling.   Gastrointestinal: Negative for abdominal distention, abdominal pain, anal bleeding, blood in stool, constipation, diarrhea, nausea, rectal pain and vomiting.   Endocrine: Negative for cold intolerance, heat intolerance, polydipsia, polyphagia and polyuria.   Genitourinary: Negative for decreased urine volume, difficulty urinating, dyspareunia, dysuria, enuresis, flank pain, frequency, genital sores, hematuria, menstrual problem, pelvic pain, urgency, vaginal bleeding, vaginal discharge and vaginal pain.   Musculoskeletal: Negative for arthralgias, back pain, gait problem, joint swelling, myalgias, neck pain and neck stiffness.   Skin: Negative for color change, pallor, rash and wound.   Allergic/Immunologic: Negative for environmental allergies, food allergies and immunocompromised state.   Neurological: Positive for numbness. Negative for dizziness, tremors, seizures, syncope, facial asymmetry, speech difficulty, weakness,  light-headedness and headaches.   Hematological: Negative for adenopathy. Does not bruise/bleed easily.   Psychiatric/Behavioral: Negative for agitation, behavioral problems, confusion, decreased concentration, dysphoric mood, hallucinations, self-injury, sleep disturbance and suicidal ideas. The patient is not nervous/anxious and is not hyperactive.      Patient Active Problem List   Diagnosis   . OM (otitis media)   . ETD (eustachian tube dysfunction)   . Dizziness and giddiness   . Anal skin tag   . Tinnitus of both ears   . Chronic sinusitis   . Acute URI   . Hypertrophy of nasal turbinates   . Nasal septal deviation   . Status post myringotomy with insertion of tube   . Eustachian tube dysfunction   . Otitis media   . Heart palpitations   . Essential hypertension   . OSA on CPAP       Past Medical History:   Diagnosis Date   . Diabetes mellitus (HCC)    . Hypertension    . Nausea & vomiting    . PONV (postoperative nausea and vomiting)    . Psychiatric problem    . Sleep apnea         Past Surgical History:   Procedure Laterality Date   . [place] SUR[place]   . CHOLECYSTECTOMY  2007   . COLONOSCOPY      2006   . HEMORRHOID SURGERY  05/2010   . HYSTERECTOMY  2007   . NECK SURGERY  2008 [facility]    ACDF   . NERVE BLOCK  10/29/2014    LUMBAR FACET INJECTION   . OTHER SURGICAL HISTORY  11/05/10    hemmroidectomy   . OTHER SURGICAL HISTORY Bilateral 01/06/2015    Lumbar Facet   .  SKIN BIOPSY  2007    left cheek   . TYMPANOSTOMY TUBE PLACEMENT  [id number],2011       Current Outpatient Prescriptions   Medication Sig Dispense Refill   . SITagliptin (JANUVIA) 50 MG tablet Take 50 mg by mouth daily     . DULoxetine (CYMBALTA) 60 MG extended release capsule Take 60 mg by mouth daily     . DULoxetine (CYMBALTA) 30 MG extended release capsule Take 30 mg by mouth daily     . HYDROcodone-acetaminophen (NORCO) 5-325 MG per tablet Take 1 tablet by mouth 2 times daily for 30 days. Fill on/after 07/02/2016. 60  tablet 0   . HYDROcodone-acetaminophen (NORCO) 5-325 MG per tablet Take 1 tablet by mouth 2 times daily as needed for Pain for up to 30 days. Fill on/after 08/01/2016. 60 tablet 0   . HYDROcodone-acetaminophen (NORCO) 5-325 MG per tablet Take 1 tablet by mouth 2 times daily as needed for Pain for up to 30 days. Fill on/after 09/01/2016. 60 tablet 0   . diltiazem (CARDIZEM CD) 240 MG extended release capsule Take 1 capsule by mouth daily 30 capsule 11   . pravastatin (PRAVACHOL) 10 MG tablet Take 10 mg by mouth daily     . Coenzyme Q10 (CO Q-10) 100 MG CAPS Take by mouth daily     . losartan-hydrochlorothiazide (HYZAAR) 100-25 MG per tablet TAKE 1 TABLET BY MOUTH ONE TIME A DAY  11   . ONE TOUCH ULTRA TEST strip CHECK BLOOD SUGAR ONCE DAILY.  3   . ibuprofen (ADVIL;MOTRIN) 800 MG tablet Take 800 mg by mouth every 6 hours as needed for Pain     . VORTIoxetine (BRINTELLIX) 10 MG TABS tablet Take 20 mg by mouth daily      . metformin (GLUCOPHAGE) 1000 MG tablet Take 1,000 mg by mouth Daily with supper.     . metformin (GLUCOPHAGE) 500 MG tablet Take 500 mg by mouth daily (with breakfast).     Marland Kitchen. zolpidem (AMBIEN) 10 MG tablet Take 10 mg by mouth nightly Takes every night     . lorazepam (ATIVAN) 1 MG tablet Take 1 mg by mouth every 6 hours as needed.         No current facility-administered medications for this visit.        No Known Allergies    Family History   Problem Relation Age of Onset   . Diabetes Other    . Hypertension Other    . Mental Illness Other    . Heart Disease Other    . Other Other      bone and joint problems       Social History     Social History   . Marital status: Married     Spouse name: N/A   . Number of children: N/A   . Years of education: N/A     Occupational History   . Not on file.     Social History Main Topics   . Smoking status: Never Smoker   . Smokeless tobacco: Never Used   . Alcohol use No   . Drug use: No   . Sexual activity: Not on file     Other Topics Concern   . Not on file      Social History Narrative   . No narrative on file       Objective:   Physical Exam  This is a 61 y.o. female. Patient is in no respiratory distress, alert  and oriented to time and place. Not nasal, not hoarse. Not obviously hearing impaired.    Vitals:    07/15/16 1347   BP: 136/78   Site: Right Arm   Position: Sitting   Pulse: 80   Resp: 16   Temp: 98.9 F (37.2 C)   TempSrc: Oral   Weight: 199 lb 3.2 oz (90.4 kg)   Height: 5' 6.5" (1.689 m)       Head is normocephalic. PERL, EOM full,  no diplopia, no nystagmus. Conjunctivae pink, no discharge    Pinnae are WNL  R External auditory canal clear and free of any pathology  L  External auditory canal clear and free of any pathology                                                Tympanic membranes:      R intact, translucent                                                L intact, SOM    Nasal bones midline  Discharge:  none    No facial redness, tenderness or swelling    No facial weakness.    Salivary glands not enlarged or palpable    Lips, tongue and oral cavity show tongue is midline, mobile.   Oropharynx: clear  Uvula midline.   Gag reflex present and symmetrical      Neck supple  Cervical adenopathy: none    Trachea midline  Thyroid not enlarged, no masses    Chest equal and symmetrical expansion, no retractions    Extremities: no clubbing, cyanosis or edema                        Gait normal    Cranial nerves grossly intact    Data:  All of the past medical history, past surgical history, family history, social history, allergies and current medications were reviewed.   Assessment:      1. Left otitis media with effusion            Plan:      Reviewed and discussed:   Orders Placed This Encounter   Procedures   . Myringotomy Tympanostomy Tube Placement     T Tube     Standing Status:   Future     Standing Expiration Date:   08/14/2016     Order Specific Question:   Pre-procedure Diagnosis     Answer:   SOM   . Basic Metabolic Panel     Standing Status:    Future     Standing Expiration Date:   07/15/2017

## 2016-07-16 ENCOUNTER — Encounter

## 2016-07-16 LAB — BASIC METABOLIC PANEL
Anion Gap: 16 mEq/L (ref 10–19)
BUN: 10 mg/dL (ref 8–23)
CO2: 28 mEq/L (ref 19–31)
Calcium: 9.5 mg/dL (ref 8.5–10.5)
Chloride: 97 mEq/L (ref 95–107)
Creatinine: 0.7 mg/dL (ref 0.6–1.3)
EGFR IF NonAfrican American: 95.5 mL/min/{1.73_m2} (ref >59)
Glucose: 169 mg/dL — ABNORMAL HIGH (ref 70–99)
Potassium: 4.3 mEq/L (ref 3.5–5.4)
Sodium: 141 mEq/L (ref 135–146)
eGFR African American: 110.7 mL/min/{1.73_m2} (ref >59)

## 2016-07-16 NOTE — Progress Notes (Signed)
The following instructions left on voicemail:         Patient instructed not to eat or drink anything after midnight the night before surgery.         Please bring along on Surgery Day:               -List of medications with the dosages & when you take them.  If you do not have a list, bring medications bottles - include prescription & over the counter medications.             -Photo ID             -Insurance information         Take any heart & blood pressure medications that you would normally take in AM with a sip of water.         You MUST have a driver with you if having a general or MAC anesthetic.         Things to leave at home:  Anything valuable, jewelry (rings, watch, piercings) - extra cash.         Wear clean comfortable clothing.          Use antibacterial soap when showering/bathing the night before or morning of surgery.         The [facility] is location in [address] in the [address] [facility] of [facility].         Following any instructions given to you by your surgeon, including the arrival time.         Please return this call @ [phone] Monday thru Friday 7:00am to 3:30pm to complete your medical history.

## 2016-07-17 LAB — CBC
Absolute Baso #: 0 10*3/uL (ref 0.0–0.1)
Absolute Eos #: 0.1 10*3/uL (ref 0.1–0.4)
Absolute Lymph #: 2 10*3/uL (ref 0.8–5.2)
Absolute Mono #: 0.4 10*3/uL (ref 0.1–0.9)
Absolute Neut #: 5.1 10*3/uL (ref 1.3–9.1)
Basophils %: 0.3 %
Eosinophils %: 1.3 %
Hematocrit: 42.7 % (ref 36.0–48.0)
Hemoglobin: 14.4 g/dL (ref 12.0–16.0)
Lymphocyte %: 26.4 %
MCH: 28.3 pg (ref 27.0–34.0)
MCHC: 33.7 g/dl (ref 31.0–36.0)
MCV: 83.9 fl
Monocytes: 5 %
Neutrophils %: 66.5 %
Platelets: 351 10*3/uL
RBC: 5.09 M/ul (ref 4.00–5.50)
RDW: 12.9 % (ref 10.8–14.8)
WBC: 7.6 10*3/uL (ref 3.7–10.8)

## 2016-07-19 ENCOUNTER — Inpatient Hospital Stay: Payer: PRIVATE HEALTH INSURANCE

## 2016-07-19 LAB — POCT GLUCOSE: POC Glucose: 248 mg/dl — ABNORMAL HIGH (ref 70–108)

## 2016-07-19 MED ORDER — ONDANSETRON HCL 4 MG/2ML IJ SOLN
4 MG/2ML | INTRAMUSCULAR | Status: DC | PRN
Start: 2016-07-19 — End: 2016-07-19
  Administered 2016-07-19: 14:00:00 4 via INTRAVENOUS

## 2016-07-19 MED ORDER — ONDANSETRON HCL 4 MG/2ML IJ SOLN
4 MG/2ML | Freq: Four times a day (QID) | INTRAMUSCULAR | Status: DC | PRN
Start: 2016-07-19 — End: 2016-07-19

## 2016-07-19 MED ORDER — PROMETHAZINE HCL 25 MG/ML IJ SOLN
25 MG/ML | Freq: Once | INTRAMUSCULAR | Status: DC | PRN
Start: 2016-07-19 — End: 2016-07-19

## 2016-07-19 MED ORDER — MEPERIDINE HCL 25 MG/ML IJ SOLN
25 MG/ML | INTRAMUSCULAR | Status: DC | PRN
Start: 2016-07-19 — End: 2016-07-19

## 2016-07-19 MED ORDER — LIDOCAINE HCL 2 % IJ SOLN
2 % | INTRAMUSCULAR | Status: DC | PRN
Start: 2016-07-19 — End: 2016-07-19
  Administered 2016-07-19: 14:00:00 80 via INTRAVENOUS

## 2016-07-19 MED ORDER — ACETAMINOPHEN 325 MG PO TABS
325 MG | ORAL | Status: DC | PRN
Start: 2016-07-19 — End: 2016-07-19
  Administered 2016-07-19: 15:00:00 650 mg via ORAL

## 2016-07-19 MED ORDER — OFLOXACIN 0.3 % OP SOLN
0.3 | OPHTHALMIC | Status: AC
Start: 2016-07-19 — End: 2016-07-19

## 2016-07-19 MED ORDER — FENTANYL CITRATE (PF) 100 MCG/2ML IJ SOLN
100 MCG/2ML | INTRAMUSCULAR | Status: DC | PRN
Start: 2016-07-19 — End: 2016-07-19

## 2016-07-19 MED ORDER — OFLOXACIN 0.3 % OP SOLN
0.3 % | OPHTHALMIC | Status: DC | PRN
Start: 2016-07-19 — End: 2016-07-19
  Administered 2016-07-19: 14:00:00 3

## 2016-07-19 MED ORDER — PROPOFOL 200 MG/20ML IV EMUL
200 MG/20ML | INTRAVENOUS | Status: DC | PRN
Start: 2016-07-19 — End: 2016-07-19
  Administered 2016-07-19: 14:00:00 120 via INTRAVENOUS

## 2016-07-19 MED ORDER — DEXAMETHASONE SODIUM PHOSPHATE 20 MG/5ML IJ SOLN
20 MG/5ML | INTRAMUSCULAR | Status: DC | PRN
Start: 2016-07-19 — End: 2016-07-19
  Administered 2016-07-19: 14:00:00 4 via INTRAVENOUS

## 2016-07-19 MED ORDER — SODIUM CHLORIDE 0.9 % IV SOLN
0.9 % | INTRAVENOUS | Status: DC
Start: 2016-07-19 — End: 2016-07-19

## 2016-07-19 MED ORDER — NORMAL SALINE FLUSH 0.9 % IV SOLN
0.9 % | Freq: Two times a day (BID) | INTRAVENOUS | Status: DC
Start: 2016-07-19 — End: 2016-07-19

## 2016-07-19 MED ORDER — DIPHENHYDRAMINE HCL 50 MG/ML IJ SOLN
50 MG/ML | Freq: Once | INTRAMUSCULAR | Status: DC | PRN
Start: 2016-07-19 — End: 2016-07-19

## 2016-07-19 MED FILL — OFLOXACIN 0.3 % OP SOLN: 0.3 % | OPHTHALMIC | Qty: 5

## 2016-07-19 MED FILL — ACETAMINOPHEN 325 MG PO TABS: 325 MG | ORAL | Qty: 2

## 2016-07-19 NOTE — Discharge Instructions (Signed)
Care After  Myringotomy with Tube Placement    1. Some drainage, either bloody or waterous, may or may not occur for a couple of day after surgery. If drainage lasts longer, please notify the doctor's office.   2. You may anticipate a certain amount of pulsation, popping, clicking, and other sounds in the ear.  3. Patient may eat after returing home.  4. May resume full activity the following day after surgery (school or work).  5. Continue to take any medications you are taking for your ear until you are checked in the office.  6. If the patient complains of any discomfort or pain, give Tylenol Elixir or tablets.  7. Do not allow water in the ears.   No swimming.   When shampooing the hair, place cotton balls with Vaseline on them into the ears.   Fill the bathtub with only a small amount of water, and no showers.   The physician will instruct you when you may discontinue these instructions.  8. Do not use Q-tips in ears; if unable to clean with small washcloth, just leave it alone.  9. An appointment and prescription will be given to you as ordered by your physician.

## 2016-07-19 NOTE — Op Note (Signed)
Pre-operative Diagnosis: Left SOM    Post-operative Diagnosis: Same    Procedure: Left M&T    Anesthesia: general    Surgeons/Assistants: Syanne Looney    Estimated Blood Loss: none    Complications: none    Specimens: were not obtained    PROCEDURE:    This is Theresa Vargas, 61 y.o. female,with a history of left SOM, brought to the operating room, placed in a supine position and draped in the usual manner. Using the operating microscope, a left myringotomy was performed. Fluid was aspirated. A T tube was inserted. Patient was returned to recovery room in satisfactory condition.      Geoffery LyonsSusan J. Shawnee Higham

## 2016-07-19 NOTE — Anesthesia Pre-Procedure Evaluation (Signed)
Department of Anesthesiology  Preprocedure Note       Name:  Theresa Vargas   Age:  61 y.o.  DOB:  1955/09/26                                          MRN:  [id number]         Date:  07/19/2016      Surgeon: Moishe Spice):  Geoffery Lyons, MD    Procedure: Procedure(s):  MYRINGOTOMY TYPANOSTOMY TUBE PLACEMENT, LEFT    Medications prior to admission:   Prior to Admission medications    Medication Sig Start Date End Date Taking? Authorizing Provider   SITagliptin (JANUVIA) 50 MG tablet Take 50 mg by mouth daily   Yes Historical Provider, MD   DULoxetine (CYMBALTA) 60 MG extended release capsule Take 60 mg by mouth daily   Yes Historical Provider, MD   DULoxetine (CYMBALTA) 30 MG extended release capsule Take 30 mg by mouth daily   Yes Historical Provider, MD   HYDROcodone-acetaminophen (NORCO) 5-325 MG per tablet Take 1 tablet by mouth 2 times daily for 30 days. Fill on/after 07/02/2016. 06/22/16 07/22/16 Yes Rocky Morel, APRN - CNP   diltiazem (CARDIZEM CD) 240 MG extended release capsule Take 1 capsule by mouth daily 01/06/16  Yes Modena Nunnery, DO   pravastatin (PRAVACHOL) 10 MG tablet Take 10 mg by mouth daily   Yes Historical Provider, MD   Coenzyme Q10 (CO Q-10) 100 MG CAPS Take by mouth daily   Yes Historical Provider, MD   losartan-hydrochlorothiazide (HYZAAR) 100-25 MG per tablet TAKE 1 TABLET BY MOUTH ONE TIME A DAY 05/31/15  Yes Historical Provider, MD   ibuprofen (ADVIL;MOTRIN) 800 MG tablet Take 800 mg by mouth every 6 hours as needed for Pain   Yes Historical Provider, MD   VORTIoxetine (BRINTELLIX) 10 MG TABS tablet Take 20 mg by mouth daily    Yes Historical Provider, MD   metformin (GLUCOPHAGE) 1000 MG tablet Take 1,000 mg by mouth Daily with supper.   Yes Historical Provider, MD   metformin (GLUCOPHAGE) 500 MG tablet Take 500 mg by mouth daily (with breakfast).   Yes Historical Provider, MD   zolpidem (AMBIEN) 10 MG tablet Take 10 mg by mouth nightly Takes every night   Yes Historical Provider, MD    lorazepam (ATIVAN) 1 MG tablet Take 1 mg by mouth every 6 hours as needed.     Yes Historical Provider, MD   HYDROcodone-acetaminophen (NORCO) 5-325 MG per tablet Take 1 tablet by mouth 2 times daily as needed for Pain for up to 30 days. Fill on/after 08/01/2016. 06/22/16 07/22/16  Rocky Morel, APRN - CNP   HYDROcodone-acetaminophen (NORCO) 5-325 MG per tablet Take 1 tablet by mouth 2 times daily as needed for Pain for up to 30 days. Fill on/after 09/01/2016. 06/22/16 07/22/16  Rocky Morel, APRN - CNP   ONE TOUCH ULTRA TEST strip CHECK BLOOD SUGAR ONCE DAILY. 05/31/15   Historical Provider, MD       Current medications:    No current facility-administered medications for this encounter.        Allergies:  No Known Allergies    Problem List:    Patient Active Problem List   Diagnosis Code   ??? OM (otitis media) H66.90   ??? ETD (eustachian tube dysfunction) H69.80   ??? Dizziness and  giddiness R42   ??? Anal skin tag K64.4   ??? Tinnitus of both ears H93.13   ??? Chronic sinusitis J32.9   ??? Acute URI J06.9   ??? Hypertrophy of nasal turbinates J34.3   ??? Nasal septal deviation J34.2   ??? Status post myringotomy with insertion of tube Z96.22   ??? Eustachian tube dysfunction H69.80   ??? Otitis media H66.90   ??? Heart palpitations R00.2   ??? Essential hypertension I10   ??? OSA on CPAP G47.33, Z99.89       Past Medical History:        Diagnosis Date   ??? Depression    ??? Diabetes mellitus (HCC)    ??? Hyperlipidemia    ??? Hypertension    ??? Nausea & vomiting    ??? Panic disorder    ??? PONV (postoperative nausea and vomiting)    ??? Psychiatric problem    ??? Sleep apnea        Past Surgical History:        Procedure Laterality Date   ??? BACK SURGERY  [place]/28/[place]   ??? CHOLECYSTECTOMY  2007   ??? COLONOSCOPY      2006   ??? HEMORRHOID SURGERY  05/2010   ??? HYSTERECTOMY  2007   ??? NECK SURGERY  2008 [facility]    AC[facility]   ??? NERVE BLOCK  10/29/2014    LUMBAR FACET INJECTION   ??? OTHER SURGICAL HISTORY  11/05/10    hemmroidectomy   ??? OTHER  SURGICAL HISTORY Bilateral 01/06/2015    Lumbar Facet   ??? SKIN BIOPSY  2007    left cheek   ??? TYMPANOSTOMY TUBE PLACEMENT  2006,2011       Social History:    Social History   Substance Use Topics   ??? Smoking status: Never Smoker   ??? Smokeless tobacco: Never Used   ??? Alcohol use No                                Counseling given: Not Answered      Vital Signs (Current):   Vitals:    07/19/16 0852   BP: (!) 157/78   Pulse: 96   Resp: 16   Temp: 97.1 ??F (36.2 ??C)   TempSrc: Temporal   SpO2: 95%   Weight: 194 lb 9.6 oz (88.3 kg)   Height: 5' 6.5" (1.689 m)                                              BP Readings from Last 3 Encounters:   07/19/16 (!) 157/78   07/15/16 136/78   07/07/16 122/74       NPO Status: Time of last liquid consumption: 2330                        Time of last solid consumption: 2330                        Date of last liquid consumption: 07/18/16                        Date of last solid food consumption: 07/18/16    BMI:   Wt  Readings from Last 3 Encounters:   07/19/16 194 lb 9.6 oz (88.3 kg)   07/15/16 199 lb 3.2 oz (90.4 kg)   07/07/16 197 lb 9.6 oz (89.6 kg)     Body mass index is 30.94 kg/m??.    CBC:   Lab Results   Component Value Date    WBC 7.6 07/16/2016    WBC 9.4 11/13/2015    RBC 5.09 07/16/2016    HGB 14.4 07/16/2016    HCT 42.7 07/16/2016    MCV 83.9 07/16/2016    RDW 12.9 07/16/2016    PLT 351 07/16/2016    PLT 468 11/13/2015       CMP:   Lab Results   Component Value Date    NA 141 07/15/2016    K 4.3 07/15/2016    CL 97 07/15/2016    CO2 28 07/15/2016    BUN 10 07/15/2016    CREATININE 0.7 07/15/2016    LABGLOM 86 11/13/2015    GLUCOSE 169 07/15/2016    PROT 7.1 01/29/2015    CALCIUM 9.5 07/15/2016    BILITOT 0.2 01/29/2015    ALKPHOS 100 01/29/2015    AST 17 01/29/2015    ALT 19 01/29/2015       POC Tests:   Recent Labs      07/19/16   0911   POCGLU  248*       Coags:   Lab Results   Component Value Date    INR 0.93 11/13/2015    APTT 31.1 11/13/2015       HCG (If Applicable):  No results found for: PREGTESTUR, PREGSERUM, HCG, HCGQUANT     ABGs: No results found for: PHART, PO2ART, PCO2ART, HCO3ART, BEART, O2SATART     Type & Screen (If Applicable):  No results found for: LABABO, [facility]    Anesthesia Evaluation  Patient summary reviewed   history of anesthetic complications (was told of difficult intubation during recent back surgery at [facility]. Right upper incisor chipped at that time.): PONV.  Airway: Mallampati: III  TM distance: >3 FB   Neck ROM: full  Mouth opening: > = 3 FB Dental:    (+) caps  Comment: Chipped upper right incisor    Pulmonary: breath sounds clear to auscultation  (+) sleep apnea: on CPAP,      (-) not a current smoker                           Cardiovascular:    (+) hypertension:, hyperlipidemia        Rhythm: regular  Rate: normal                    Neuro/Psych:   (+) depression/anxiety             GI/Hepatic/Renal:            ROS comment: BMI 31.   Endo/Other:    (+) DiabetesType II DM, poorly controlled, , .                 Abdominal:           Vascular: negative vascular ROS.                                       Anesthesia Plan      general     ASA  3       Induction: intravenous.    MIPS: Prophylactic antiemetics administered.  Anesthetic plan and risks discussed with patient.      Plan discussed with CRNA.                  Robie Ridge, MD   07/19/2016

## 2016-07-19 NOTE — Progress Notes (Signed)
0915 DR. MOON MADE AWARE OF ACCUCHECK 248. NO ORDERS RECEIVED

## 2016-07-19 NOTE — Anesthesia Post-Procedure Evaluation (Signed)
Department of Anesthesiology  Postprocedure Note    Patient: Theresa Vargas  MRN: [id number]  Birthdate: Jan 25, 1956  Date of evaluation: 07/19/2016  Time:  11:11 AM     Procedure Summary     Date:  07/19/16 Room / Location:  [facility] OR 02 / [facility] OR    Anesthesia Start:  0940 Anesthesia Stop:  0955    Procedure:  MYRINGOTOMY TYPANOSTOMY TUBE PLACEMENT, LEFT (Left Ear) Diagnosis:  (LEFT OTITIS MEDIA W/EFFUSION)    Surgeon:  Geoffery LyonsSusan J Rossi, MD Responsible Provider:  Robyne Peersobert Correna Meacham, MD    Anesthesia Type:  general ASA Status:  3          Anesthesia Type: general    Aldrete Phase I: Aldrete Score: 10    Aldrete Phase II: Aldrete Score: 10    Last vitals: Reviewed and per EMR flowsheets.       Anesthesia Post Evaluation    Patient location during evaluation: PACU  Patient participation: complete - patient participated  Level of consciousness: awake  Nausea & Vomiting: no nausea  Complications: no  Cardiovascular status: hemodynamically stable  Respiratory status: spontaneous ventilation

## 2016-07-19 NOTE — Progress Notes (Signed)
[id number]:  Pt arrival to PACU, VSS, POx 93% RA.  Spontaneous respirations noted.  Pt rates pain "10"/10, falls back to sleep easily.  No drainage from ear noted.  IV infusing.  Warm blanket provided to pt.  Pt resting, eyes closed.    1001:  VSS.  POx 92% RA.  Pt educated to take deep breaths.  POx improves to 95% RA.  Pt resting with eyes closed.    1006:  VSS.  POx 91% RA.  Pt educated to take deep breaths.  POx improves to 94% RA.  Pt resting.    1011:  VSS.  POx 97% RA.  Pt resting with eyes closed.    1016:  VSS.  POx 96% RA.  Pt moved to phase II recovery, family to bedside.  Belongings returned, call light in reach.  Pt resting.    1030:  Pt rates pain "10"/10, denies nausea.  Pt given oral pain meds.  Pt resting.    1045:  Pt & son educated on d/c instructions, both verbalize understanding.  Pt dressing for d/c.    1052:  Pt escorted to passenger side of car, son driving her home.  Pt d/c in stable condition.

## 2016-07-19 NOTE — H&P (Signed)
I have examined the patient and reviewed the history and physical 07/15/16 and find no relevant changes.    I have reviewed with the patient and/or family the risks, benefits, and alternatives to the procedure and they have agreed to proceed.    Hettie HolsteinSusan J Rossi6/14/18

## 2016-08-05 ENCOUNTER — Encounter: Attending: Family

## 2016-08-10 ENCOUNTER — Ambulatory Visit: Admit: 2016-08-10 | Discharge: 2016-08-10 | Payer: PRIVATE HEALTH INSURANCE | Attending: Family

## 2016-08-10 DIAGNOSIS — Z9622 Myringotomy tube(s) status: Secondary | ICD-10-CM

## 2016-08-10 NOTE — Progress Notes (Signed)
[facility]  [address]  [address]  [place] [id number]  Dept: [phone]  Dept Fax: [phone]  Loc: [phone]    Theresa Vargas is a 61 y.o. female who was referred by No ref. provider found for:  Chief Complaint   Patient presents with   . Post-Op Check     Patient is here post-op Left MAT 07/19/16   .    HPI:       Patient is here for postop check.  She had left PET placed 07/19/16 by Dr. Andrey Farmerossi for chronic left middle ear effusion.  Patient reports that her hearing has improved.  She has not had any ear pain or ear drainage.        Subjective:        Review of Systems   Constitutional: Negative for activity change, appetite change, chills, diaphoresis, fatigue, fever and unexpected weight change.   HENT: Negative for congestion, dental problem, drooling, ear discharge, ear pain, facial swelling, hearing loss, mouth sores, nosebleeds, postnasal drip, rhinorrhea, sinus pressure, sneezing, sore throat, tinnitus, trouble swallowing and voice change.    Eyes: Negative for photophobia, pain, discharge, redness, itching and visual disturbance.   Respiratory: Negative for apnea, cough, choking, chest tightness, shortness of breath, wheezing and stridor.    Cardiovascular: Negative for chest pain, palpitations and leg swelling.   Gastrointestinal: Negative for abdominal distention, abdominal pain, anal bleeding, blood in stool, constipation, diarrhea, nausea, rectal pain and vomiting.   Endocrine: Negative for cold intolerance, heat intolerance, polydipsia, polyphagia and polyuria.   Genitourinary: Negative for decreased urine volume, difficulty urinating, dyspareunia, dysuria, enuresis, flank pain, frequency, genital sores, hematuria, menstrual problem, pelvic pain, urgency, vaginal bleeding, vaginal discharge and vaginal pain.   Musculoskeletal: Negative for arthralgias, back pain, gait problem, joint swelling, myalgias, neck pain and neck stiffness.   Skin: Negative for  color change, pallor, rash and wound.   Allergic/Immunologic: Negative for environmental allergies, food allergies and immunocompromised state.   Neurological: Negative for dizziness, tremors, seizures, syncope, facial asymmetry, speech difficulty, weakness, light-headedness, numbness and headaches.   Hematological: Negative for adenopathy. Does not bruise/bleed easily.   Psychiatric/Behavioral: Negative for agitation, behavioral problems, confusion, decreased concentration, dysphoric mood, hallucinations, self-injury, sleep disturbance and suicidal ideas. The patient is not nervous/anxious and is not hyperactive.        Objective:       Physical Exam     This is a 61 y.o. female. Patient is alert and oriented to person, place and time. Mood is happy. No respiratory distress. No nasal voice, no hoarseness. Not obviously hearing impaired.    Vitals:    08/10/16 1117   BP: 124/70   Pulse: 80   Resp: 12   Temp: 98 F (36.7 C)       External ears are normal: no scars, lesions or masses.   R External auditory canal clear and free of any pathology  L External auditory canal clear and free of any pathology   Tympanic membranes:  R intact, translucent                                            L tube in place-dry, patent    Data:  All of the past medical history, past surgical history, family history, social history, allergies and current medications were  reviewed.       Assessment:   Assessment    Diagnosis Orders   1. S/P tympanostomy tube placement, left          Plan:        1. S/P tympanostomy tube placement, left  Continue water precautions for left ear.  Call for any ear drainage.  Follow-up every 6 months while tubes are in place.        Return in about 6 months (around 02/10/2017) for tube check.

## 2016-09-21 MED ORDER — HYDROCODONE-ACETAMINOPHEN 5-325 MG PO TABS
5-325 MG | ORAL_TABLET | Freq: Two times a day (BID) | ORAL | 0 refills | Status: AC | PRN
Start: 2016-09-21 — End: 2016-10-21

## 2016-09-21 MED ORDER — HYDROCODONE-ACETAMINOPHEN 5-325 MG PO TABS
5-325 | ORAL_TABLET | Freq: Two times a day (BID) | ORAL | 0 refills | Status: AC
Start: 2016-09-21 — End: 2016-10-21

## 2016-09-21 NOTE — Progress Notes (Signed)
[facility]    Physical Medicine & Rehabilitation  Outpatient progress note    Chief Complaint:   Chief Complaint   Patient presents with   ??? 3 Month Follow-Up     radiculopathy, lumbosacral region        Subjective: Theresa Vargas is a 61 y.o. female who returns to the office today for further follow up. Pain in her lower back and lower limbs is stable. She continues to use Norco PRN for pain which is effective. Has been trying to exercise more regularly which does temporarily make her back pain worse. She would like to loose weight.     Review of Systems:  CONSTITUTIONAL: negative  RESPIRATORY: negative  CARDIOVASCULAR: negative  GASTROINTESTINAL: negative  GENITOURINARY: negative  MUSCULOSKELETAL: positive for back and neck pain  NEUROLOGICAL: positive for pain  BEHAVIOR/PSYCH: negative  10 point system review otherwise negative    Physical Exam:  BP 139/81 (Site: Left Arm, Position: Sitting)    Pulse 105    Ht 5' 6.5" (1.689 m)    Wt 196 lb 12.8 oz (89.3 kg)    BMI 31.29 kg/m??     awake  Orientation:   person, place, time  Mood: within normal limits  Affect: calm  General appearance: Patient is well nourished, well developed, well groomed and in no acute distress    Memory:   normal,   Attention/Concentration: normal  Language:  normal    Cranial Nerves:  cranial nerves II-XII are grossly intact  ROM:  Decreased range of motion in cervical and lumbar region  Diffuse tenderness throughout cervical and lumbar back.  Motor Exam:  Motor exam is symmetrical 5 out of 5 all extremities bilaterally  Tone:  normal  Muscle bulk: within normal limits  Sensory:  Abnormal sensation on top of left foot  Coordination:   normal  Deep Tendon Reflexes:  Reflexes are intact and symmetrical bilaterally    Skin: warm and dry, no rash or erythema  Peripheral vascular: Pulses: Normal upper and lower extremity pulses; Edema: no      Impression:  ?? Lumbar back pain  ?? Lumbar spondylosis  ?? Left  lower limb radiculopathy  ?? Left cervical radiculopathy s/p prior ACDF 2008  ?? Plantar fasciitis    Plan:  ?? Ongoing follow up with pain management, patient does not wish to receive injections at this time  ?? Norco as needed for pain- refills given  ?? Continue with physical activity and weight loss efforts    Will continue to monitor any benefits vs side effects of the medications as prescribed.  The patient has been warned about the risk of operating machinery including driving if impaired in any way by these medications.  The patient also accepts the risks of tolerance, dependency, or addiction related to the prescribed medications.  All questions were answered.  Reevaluation as planned, or sooner if requested.     Controlled Substances Monitoring: Attestation: The Prescription Monitoring Report for this patient was reviewed today. Marland KitchenRocky Morel, APRN - CNP)  Documentation: Possible medication side effects, risk of tolerance/dependence & alternative treatments discussed., No signs of potential drug abuse or diversion identified. Marland KitchenRocky Morel, APRN - CNP)    Return in about 3 months (around 12/22/2016).     It was my pleasure to evaluate Theresa Vargas today.  Please call with any concerns or questions.  15 minutes spent in evaluation efforts    Rocky Morel, APRN -  CNP

## 2016-09-27 ENCOUNTER — Emergency Department: Admit: 2016-09-28 | Payer: PRIVATE HEALTH INSURANCE

## 2016-09-27 DIAGNOSIS — M79622 Pain in left upper arm: Secondary | ICD-10-CM

## 2016-09-27 NOTE — ED Triage Notes (Signed)
Comes to er c/o left arm pain at a 10 ekg done . Denies any chest pain.

## 2016-09-27 NOTE — ED Provider Notes (Signed)
[facility]  eMERGENCY dEPARTMENT eNCOUnter          CHIEF COMPLAINT       Chief Complaint   Patient presents with   . Arm Pain       Nurses Notes reviewed and I agree except as noted in the HPI.    HISTORY OF PRESENT ILLNESS    Theresa Vargas is a 61 y.o. female who presents to the Emergency Department for the evaluation of left arm pain. The patient reports intermittent left upper arm pain onset 1400 yesterday. The patient denies movement worsening her pain. She rates the pain as 10/10. The patient reports using icy hot, ice, heating pad, Motrin, and Norco prescribed for her back with no relief. The patient denies injury of different activity. The patient reports cancer screening in her left upper arm that was found to be fatty tissue. The patient reports history of hypertension, hyperlipidemia, and diabetes. The patient reports family history of MI. The patient denies history of MI or blood clots. The patient denies recent travel. PCP is Dr Seleta Rhymes. Cardiologist is Dr Derrill Memo.      The HPI was provided by the patient.     REVIEW OF SYSTEMS     Review of Systems   Constitutional: Negative for appetite change, chills, fatigue and fever.   HENT: Negative for congestion, ear pain, rhinorrhea and sore throat.    Eyes: Negative for pain, discharge and visual disturbance.   Respiratory: Negative for cough, shortness of breath and wheezing.    Cardiovascular: Negative for chest pain, palpitations and leg swelling.   Gastrointestinal: Negative for abdominal pain, diarrhea, nausea and vomiting.   Genitourinary: Negative for difficulty urinating, dysuria, hematuria and vaginal discharge.   Musculoskeletal: Positive for myalgias (left upper arm pain). Negative for arthralgias, back pain, joint swelling and neck pain.   Skin: Negative for pallor and rash.   Neurological: Negative for dizziness, syncope, weakness, light-headedness, numbness and headaches.   Hematological: Negative for adenopathy.    Psychiatric/Behavioral: Negative for confusion and suicidal ideas. The patient is not nervous/anxious.        PAST MEDICAL HISTORY    has a past medical history of Depression; Diabetes mellitus (HCC); Hyperlipidemia; Hypertension; Nausea & vomiting; Panic disorder; PONV (postoperative nausea and vomiting); Psychiatric problem; and Sleep apnea.    SURGICAL HISTORY      has a past surgical history that includes Cholecystectomy (2007); Hysterectomy (2007); Neck surgery (2008 [facility]); Tympanostomy tube placement (1610,9604); skin biopsy (2007); Colonoscopy; Hemorrhoid surgery (05/2010); other surgical history (11/05/10); Nerve Block (10/29/2014); other surgical history (Bilateral, 01/06/2015); back surgery (11/28/2015); and pr create eardrum opening,gen anesth (Left, 07/19/2016).    CURRENT MEDICATIONS       Previous Medications    COENZYME Q10 (CO Q-10) 100 MG CAPS    Take by mouth daily    DILTIAZEM (CARDIZEM CD) 240 MG EXTENDED RELEASE CAPSULE    Take 1 capsule by mouth daily    DULOXETINE (CYMBALTA) 30 MG EXTENDED RELEASE CAPSULE    Take 30 mg by mouth daily    DULOXETINE (CYMBALTA) 60 MG EXTENDED RELEASE CAPSULE    Take 60 mg by mouth daily    HYDROCODONE-ACETAMINOPHEN (NORCO) 5-325 MG PER TABLET    Take 1 tablet by mouth 2 times daily for 30 days. Fill on/after 10/01/2016.    HYDROCODONE-ACETAMINOPHEN (NORCO) 5-325 MG PER TABLET    Take 1 tablet by mouth 2 times daily as needed for Pain for up to 30  days. Fill on/after 10/02/2016.    HYDROCODONE-ACETAMINOPHEN (NORCO) 5-325 MG PER TABLET    Take 1 tablet by mouth 2 times daily as needed for Pain for up to 30 days. Fill on/after 11/01/2016.    IBUPROFEN (ADVIL;MOTRIN) 800 MG TABLET    Take 800 mg by mouth every 6 hours as needed for Pain    LORAZEPAM (ATIVAN) 1 MG TABLET    Take 1 mg by mouth every 6 hours as needed.      LOSARTAN-HYDROCHLOROTHIAZIDE (HYZAAR) 100-25 MG PER TABLET    TAKE 1 TABLET BY MOUTH ONE TIME A DAY    METFORMIN (GLUCOPHAGE) 1000 MG TABLET     Take 1,000 mg by mouth Daily with supper.    METFORMIN (GLUCOPHAGE) 500 MG TABLET    Take 500 mg by mouth daily (with breakfast).    ONE TOUCH ULTRA TEST STRIP    CHECK BLOOD SUGAR ONCE DAILY.    PRAVASTATIN (PRAVACHOL) 10 MG TABLET    Take 10 mg by mouth daily    SITAGLIPTIN (JANUVIA) 50 MG TABLET    Take 50 mg by mouth daily    VORTIOXETINE (BRINTELLIX) 10 MG TABS TABLET    Take 20 mg by mouth daily     ZOLPIDEM (AMBIEN) 10 MG TABLET    Take 10 mg by mouth nightly Takes every night       ALLERGIES     has No Known Allergies.    FAMILY HISTORY     indicated that the status of her other is unknown.    family history includes Diabetes in an other family member; Heart Disease in an other family member; Hypertension in an other family member; Mental Illness in an other family member; Other in an other family member.    SOCIAL HISTORY      reports that she has never smoked. She has never used smokeless tobacco. She reports that she does not drink alcohol or use drugs.    PHYSICAL EXAM     INITIAL VITALS:  oral temperature is 98.4 F (36.9 C). Her blood pressure is 133/82 and her pulse is 91. Her respiration is 18 and oxygen saturation is 98%.    Physical Exam   Constitutional: She is oriented to person, place, and time. She appears well-developed and well-nourished.  Non-toxic appearance.   HENT:   Head: Normocephalic and atraumatic.   Right Ear: Tympanic membrane and external ear normal.   Left Ear: Tympanic membrane and external ear normal.   Nose: Nose normal.   Mouth/Throat: Oropharynx is clear and moist and mucous membranes are normal. No oropharyngeal exudate, posterior oropharyngeal edema or posterior oropharyngeal erythema.   Eyes: Pupils are equal, round, and reactive to light. Conjunctivae and EOM are normal.   Neck: Normal range of motion. Neck supple. No JVD present.   Cardiovascular: Normal rate, regular rhythm, normal heart sounds, intact distal pulses and normal pulses.  Exam reveals no gallop and no  friction rub.    No murmur heard.  Pulmonary/Chest: Effort normal and breath sounds normal. No respiratory distress. She has no decreased breath sounds. She has no wheezes. She has no rhonchi. She has no rales.   Abdominal: Soft. Bowel sounds are normal. She exhibits no distension. There is no tenderness. There is no rebound, no guarding and no CVA tenderness.   Musculoskeletal: Normal range of motion. She exhibits no edema.        Left upper arm: She exhibits tenderness.   Neurological: She is alert and  oriented to person, place, and time. She exhibits normal muscle tone. Coordination normal.   Skin: Skin is warm and dry. No rash noted. She is not diaphoretic.   Nursing note and vitals reviewed.      DIFFERENTIAL DIAGNOSIS:   Including but not limited to: Strain, Fracture, Blood clot,  ACS, acute MI    DIAGNOSTIC RESULTS     EKG: All EKG's are interpreted by the Emergency Department Physician who either signs or Co-signs this chart in the absence of a cardiologist.  EKG interpreted by Gaylord Shih, MD:    Vent. Rate: 99 bpm  PR interval: 174 ms  QRS duration: 72 ms  QTc: 449 ms  P-R-T axes: 49, 10, 36  Normal sinus rhythm. No STEMI  Compared to old EKG on 13-Nov-2015      RADIOLOGY: non-plain film images(s) such as CT, Ultrasound and MRI are read by the radiologist.    XR CHEST STANDARD (2 VW)   Final Result   Stable radiographic appearance of the chest. No evidence of an acute process.               **This report has been created using voice recognition software. It may contain minor errors which are inherent in voice recognition technology.**      Final report electronically signed by Dr. Helyn Numbers on 09/28/2016 12:19 AM      XR HUMERUS LEFT (MIN 2 VIEWS)   Final Result   1. Normal humerus.               **This report has been created using voice recognition software. It may contain minor errors which are inherent in voice recognition technology.**      Final report electronically signed by Dr. Helyn Numbers on 09/28/2016 12:18 AM      VL DUP UPPER EXTREMITY VENOUS LEFT    (Results Pending)       LABS:   Labs Reviewed   CBC WITH AUTO DIFFERENTIAL - Abnormal; Notable for the following:        Result Value    MPV 9.0 (*)     All other components within normal limits   BASIC METABOLIC PANEL - Abnormal; Notable for the following:     Chloride 96 (*)     Glucose 246 (*)     All other components within normal limits   GLOMERULAR FILTRATION RATE, ESTIMATED - Abnormal; Notable for the following:     Est, Glom Filt Rate 73 (*)     All other components within normal limits   TROPONIN   ANION GAP   OSMOLALITY   TROPONIN   D-DIMER, QUANTITATIVE       EMERGENCY DEPARTMENT COURSE:   Vitals:    Vitals:    09/27/16 2258 09/28/16 0030   BP: (!) 153/78 133/82   Pulse: 97 91   Resp: 18 18   Temp: 98.4 F (36.9 C)    TempSrc: Oral    SpO2: 96% 98%       1:00 AM: The patient was seen and evaluated.     MDM:  She was seen and evaluated in emergency for left arm pain.    Labwork was done which she had 2 negative troponins, d-dimer negative, other lab work as mentioned above.    X-ray negative.    I ordered an ultrasound for her left upper extremity to be done today in the morning to rule out any deep venous thrombosis.  Patient was also advised to  follow up with [facility] [place].    Follow-up with primary care physician.  Return precautions were discussed with the patient.  Patient was discharged home in stable condition.    PROCEDURES:  None     FINAL IMPRESSION      1. Left arm pain          DISPOSITION/PLAN   Discharge    PATIENT REFERRED TO:  [facility]  [address] [place] [address]  [phone]  Schedule an appointment as soon as possible for a visit in 2 days      Alena Bills, MD  [address] [place]  [phone]  [place] [address]  [phone]    Schedule an appointment as soon as possible for a visit in 2 days        DISCHARGE MEDICATIONS:  New Prescriptions    No medications on file        (Please note that portions of this note were completed with a voice recognition program.  Efforts were made to edit the dictations but occasionally words are mis-transcribed.)    Scribe:  Vara Guardian 09/28/16 1:00 AM Scribing for and in the presence of Gaylord Shih, MD.    Signed by: Vara Guardian, Scribe, 09/28/16 2:46 AM    Provider:  I personally performed the services described in the documentation, reviewed and edited the documentation which was dictated to the scribe in my presence, and it accurately records my words and actions.    Gaylord Shih, MD 09/28/16 2:46 AM                            Gaylord Shih, MD  09/28/16 [phone]

## 2016-09-28 ENCOUNTER — Inpatient Hospital Stay
Admit: 2016-09-28 | Discharge: 2016-09-28 | Disposition: A | Discharge status: Home / Self Care | Payer: PRIVATE HEALTH INSURANCE | Attending: Emergency Medicine

## 2016-09-28 ENCOUNTER — Encounter

## 2016-09-28 ENCOUNTER — Inpatient Hospital Stay: Admit: 2016-09-28 | Payer: PRIVATE HEALTH INSURANCE

## 2016-09-28 DIAGNOSIS — R52 Pain, unspecified: Secondary | ICD-10-CM

## 2016-09-28 LAB — CBC WITH AUTO DIFFERENTIAL
Basophils Absolute: 0 10*3/uL (ref 0.0–0.1)
Basophils: 0.3 %
Eosinophils Absolute: 0.1 10*3/uL (ref 0.0–0.4)
Eosinophils: 1.1 %
Hematocrit: 44.1 % (ref 37.0–47.0)
Hemoglobin: 14.7 gm/dl (ref 12.0–16.0)
Immature Grans (Abs): 0.02 10*3/uL (ref 0.00–0.07)
Immature Granulocytes: 0.2 %
Lymphocytes Absolute: 3.6 10*3/uL (ref 1.0–4.8)
Lymphocytes: 40.1 %
MCH: 29 pg (ref 26.0–33.0)
MCHC: 33.3 gm/dl (ref 32.2–35.5)
MCV: 87 fL (ref 81.0–99.0)
MPV: 9 fL — ABNORMAL LOW (ref 9.4–12.4)
Monocytes Absolute: 0.6 10*3/uL (ref 0.4–1.3)
Monocytes: 6.4 %
Platelets: 373 10*3/uL (ref 130–400)
RBC: 5.07 10*6/uL (ref 4.20–5.40)
RDW-CV: 12.6 % (ref 11.5–14.5)
RDW-SD: 39.9 fL (ref 35.0–45.0)
Seg Neutrophils: 51.9 %
Segs Absolute: 4.7 10*3/uL (ref 1.8–7.7)
WBC: 9 10*3/uL (ref 4.8–10.8)
nRBC: 0 /100 wbc

## 2016-09-28 LAB — BASIC METABOLIC PANEL
BUN: 18 mg/dL (ref 7–22)
CO2: 26 meq/L (ref 23–33)
Calcium: 9.8 mg/dL (ref 8.5–10.5)
Chloride: 96 meq/L — ABNORMAL LOW (ref 98–111)
Creatinine: 0.8 mg/dL (ref 0.4–1.2)
Glucose: 246 mg/dL — ABNORMAL HIGH (ref 70–108)
Potassium: 4 meq/L (ref 3.5–5.2)
Sodium: 137 meq/L (ref 135–145)

## 2016-09-28 LAB — D-DIMER, QUANTITATIVE: D-Dimer, Quant: 244 ng/ml FEU (ref 0.00–500.0)

## 2016-09-28 LAB — TROPONIN
Troponin T: <0.01 ng/ml
Troponin T: <0.01 ng/ml

## 2016-09-28 LAB — ANION GAP: Anion Gap: 15 meq/L (ref 8.0–16.0)

## 2016-09-28 LAB — OSMOLALITY: Osmolality Calc: 283.9 mOsmol/kg (ref >=275.0)

## 2016-09-28 LAB — GLOMERULAR FILTRATION RATE, ESTIMATED: Est, Glom Filt Rate: 73 mL/min/{1.73_m2} — AB

## 2016-09-28 MED ORDER — HYDROCODONE-ACETAMINOPHEN 5-325 MG PO TABS
5-325 MG | Freq: Once | ORAL | Status: AC
Start: 2016-09-28 — End: 2016-09-28
  Administered 2016-09-28: 06:00:00 1 via ORAL

## 2016-09-28 MED FILL — HYDROCODONE-ACETAMINOPHEN 5-325 MG PO TABS: 5-325 MG | ORAL | Qty: 1

## 2016-09-28 NOTE — ED Notes (Signed)
Presents to 15 with the complaints of arm pain that started at 2pm today no known injury states that she put heat and used motrin at home for the pain. States that she has also tried ice along with icy hot at home for the pain states that she has left over norco at home for her back and took one of those it did help the pain a little but no significant relieve from the pain states that she has a history of neck surgery in 2008      Royetta Crochet, [place]  09/28/16 [phone]

## 2016-09-29 LAB — EKG 12-LEAD
Atrial Rate: 99 {beats}/min
Atrial Rate: 99 {beats}/min
P Axis: 49 degrees
P Axis: 49 degrees
P-R Interval: 174 ms
P-R Interval: 174 ms
Q-T Interval: 350 ms
Q-T Interval: 350 ms
QRS Duration: 72 ms
QRS Duration: 72 ms
QTc Calculation (Bazett): 449 ms
QTc Calculation (Bazett): 449 ms
R Axis: 10 degrees
R Axis: 10 degrees
T Axis: 36 degrees
T Axis: 36 degrees
Ventricular Rate: 99 {beats}/min
Ventricular Rate: 99 {beats}/min

## 2016-10-11 ENCOUNTER — Ambulatory Visit: Payer: PRIVATE HEALTH INSURANCE

## 2016-10-14 ENCOUNTER — Inpatient Hospital Stay: Admit: 2016-10-14 | Payer: PRIVATE HEALTH INSURANCE

## 2016-10-14 ENCOUNTER — Encounter

## 2016-10-14 DIAGNOSIS — Z1231 Encounter for screening mammogram for malignant neoplasm of breast: Secondary | ICD-10-CM

## 2016-10-28 ENCOUNTER — Inpatient Hospital Stay: Admit: 2016-10-28 | Payer: PRIVATE HEALTH INSURANCE

## 2016-10-28 DIAGNOSIS — R079 Chest pain, unspecified: Secondary | ICD-10-CM

## 2016-10-28 MED ORDER — REGADENOSON 0.4 MG/5ML IV SOLN
0.4 | INTRAVENOUS | Status: AC
Start: 2016-10-28 — End: 2016-10-28

## 2016-10-28 MED ORDER — TECHNETIUM TC 99M SESTAMIBI - CARDIOLITE
Freq: Once | Status: AC | PRN
Start: 2016-10-28 — End: 2016-10-28
  Administered 2016-10-28: 11:00:00 10 via INTRAVENOUS

## 2016-10-28 MED ORDER — TECHNETIUM TC 99M SESTAMIBI - CARDIOLITE
Freq: Once | Status: AC | PRN
Start: 2016-10-28 — End: 2016-10-28
  Administered 2016-10-28: 13:00:00 32.7 via INTRAVENOUS

## 2016-11-29 ENCOUNTER — Encounter

## 2016-11-29 MED ORDER — HYDROCODONE-ACETAMINOPHEN 5-325 MG PO TABS
5-325 MG | ORAL_TABLET | Freq: Two times a day (BID) | ORAL | 0 refills | Status: DC | PRN
Start: 2016-11-29 — End: 2016-12-27

## 2016-11-29 NOTE — Telephone Encounter (Signed)
OARRS reviewed. Last seen: 09/21/2016. Follow-up: 12/27/2016

## 2016-12-27 ENCOUNTER — Ambulatory Visit: Admit: 2016-12-27 | Discharge: 2016-12-27 | Payer: PRIVATE HEALTH INSURANCE | Attending: Nurse Practitioner

## 2016-12-27 DIAGNOSIS — M5417 Radiculopathy, lumbosacral region: Secondary | ICD-10-CM

## 2016-12-27 MED ORDER — HYDROCODONE-ACETAMINOPHEN 5-325 MG PO TABS
5-325 MG | ORAL_TABLET | Freq: Three times a day (TID) | ORAL | 0 refills | Status: AC | PRN
Start: 2016-12-27 — End: 2017-01-26

## 2016-12-27 MED ORDER — HYDROCODONE-ACETAMINOPHEN 5-325 MG PO TABS
5-325 | ORAL_TABLET | Freq: Three times a day (TID) | ORAL | 0 refills | Status: AC | PRN
Start: 2016-12-27 — End: 2017-01-26

## 2016-12-27 NOTE — Progress Notes (Signed)
[facility]    Physical Medicine & Rehabilitation  Outpatient progress note    Chief Complaint:   Chief Complaint   Patient presents with   . 3 Month Follow-Up        Subjective: Theresa Vargas is a 61 y.o. female who returns to the office today for further follow up. Pain in her lower back and lower limbs is stable. She continues to use Norco PRN for pain which is effective. Has been trying to exercise more regularly which does temporarily make her back pain worse. She feels that it would help if she could take a Norco after she exercises. Usually takes 1 tab in morning, and 1 tab at night which helps her sleep.     Review of Systems:  CONSTITUTIONAL: negative  RESPIRATORY: negative  CARDIOVASCULAR: negative  GASTROINTESTINAL: negative  GENITOURINARY: negative  MUSCULOSKELETAL: positive for back and neck pain  NEUROLOGICAL: positive for pain  BEHAVIOR/PSYCH: negative  10 point system review otherwise negative    Physical Exam:  BP 129/76   Pulse 104   Ht 5' 6.5" (1.689 m)   Wt 192 lb (87.1 kg)   BMI 30.53 kg/m     awake  Orientation:   person, place, time  Mood: within normal limits  Affect: calm  General appearance: Patient is well nourished, well developed, well groomed and in no acute distress    Memory:   normal,   Attention/Concentration: normal  Language:  normal    Cranial Nerves:  cranial nerves II-XII are grossly intact  ROM:  Decreased range of motion in cervical and lumbar region  Diffuse tenderness throughout cervical and lumbar back.  Motor Exam:  Motor exam is symmetrical 5 out of 5 all extremities bilaterally  Tone:  normal  Muscle bulk: within normal limits  Sensory:  Abnormal sensation on top of left foot  Coordination:   normal  Deep Tendon Reflexes:  Reflexes are intact and symmetrical bilaterally    Skin: warm and dry, no rash or erythema  Peripheral vascular: Pulses: Normal upper and lower extremity pulses; Edema: no      Impression:   Lumbar back  pain    Lumbar spondylosis   Left lower limb radiculopathy   Left cervical radiculopathy s/p prior ACDF 2008   Plantar fasciitis   Lumbar laminectomy Oct 2017    Plan:   Ongoing follow up with pain management, patient does not wish to receive injections at this time   Increase Norco to three times daily PRN   Continue with physical activity and weight loss efforts    Will continue to monitor any benefits vs side effects of the medications as prescribed.  The patient has been warned about the risk of operating machinery including driving if impaired in any way by these medications.  The patient also accepts the risks of tolerance, dependency, or addiction related to the prescribed medications.  All questions were answered.  Reevaluation as planned, or sooner if requested.     Controlled Substances Monitoring: Attestation: The Prescription Monitoring Report for this patient was reviewed today. Marland Kitchen(Rocky MorelBrittany N Rubbie Goostree, APRN - CNP)  Documentation: Possible medication side effects, risk of tolerance/dependence & alternative treatments discussed., No signs of potential drug abuse or diversion identified. Marland Kitchen(Rocky MorelBrittany N Aarsh Fristoe, APRN - CNP)    Return in about 3 months (around 03/29/2017).     It was my pleasure to evaluate Theresa BuckerSandra A Tsou today.  Please call with any concerns or questions.  15  minutes spent in evaluation efforts    Glendora Score, APRN - CNP

## 2017-01-04 ENCOUNTER — Encounter: Attending: Internal Medicine

## 2017-01-14 ENCOUNTER — Ambulatory Visit: Admit: 2017-01-14 | Discharge: 2017-01-14 | Payer: PRIVATE HEALTH INSURANCE | Attending: Internal Medicine

## 2017-01-14 DIAGNOSIS — I2510 Atherosclerotic heart disease of native coronary artery without angina pectoris: Secondary | ICD-10-CM

## 2017-01-14 NOTE — Progress Notes (Signed)
Pt here for 1 yr check up     Pt denies chest pain, sob,  Dizziness, peripheral edema, heart palpitations     Pt states went to ED , had stress test 927/18    Pt concerned about med change with Dr. Seleta RhymesBowlus cardizem and losartan-hctz

## 2017-01-14 NOTE — Progress Notes (Signed)
[facility]  [facility]  [address]  [address]  [place] [address]  Dept: [phone]  Dept Fax: [phone]  Loc: [phone]    Visit Date: 01/14/2017    Ms. Jha is a 61 y.o. female  who presented for:  Chief Complaint   Patient presents with   . Check-Up   . Hypertension       HPI:   61 yo F c hx of HTN, HLD, DM, Panic disorder is here for a follow up. She used to follow up with Dr. Derrill Memo in the past.  Denies any chest pain, sob, palpitations, lightheadedness, dizziness, orthopnea, PND or pedal edema.           Current Outpatient Prescriptions:   .  HYDROcodone-acetaminophen (NORCO) 5-325 MG per tablet, Take 1 tablet by mouth every 8 hours as needed for Pain for up to 30 days. Fill on/after 12/31/2016., Disp: 90 tablet, Rfl: 0  .  HYDROcodone-acetaminophen (NORCO) 5-325 MG per tablet, Take 1 tablet by mouth every 8 hours as needed for Pain for up to 30 days. Fill on/after 01/30/2017., Disp: 90 tablet, Rfl: 0  .  HYDROcodone-acetaminophen (NORCO) 5-325 MG per tablet, Take 1 tablet by mouth every 8 hours as needed for Pain for up to 30 days. Fill on/after 03/01/2017., Disp: 90 tablet, Rfl: 0  .  SITagliptin (JANUVIA) 50 MG tablet, Take 50 mg by mouth daily, Disp: , Rfl:   .  DULoxetine (CYMBALTA) 60 MG extended release capsule, Take 60 mg by mouth daily, Disp: , Rfl:   .  DULoxetine (CYMBALTA) 30 MG extended release capsule, Take 30 mg by mouth daily, Disp: , Rfl:   .  diltiazem (CARDIZEM CD) 240 MG extended release capsule, Take 1 capsule by mouth daily (Patient taking differently: Take 300 mg by mouth daily ), Disp: 30 capsule, Rfl: 11  .  pravastatin (PRAVACHOL) 10 MG tablet, Take 20 mg by mouth daily , Disp: , Rfl:   .  Coenzyme Q10 (CO Q-10) 100 MG CAPS, Take by mouth daily, Disp: , Rfl:   .  losartan-hydrochlorothiazide (HYZAAR) 100-25 MG per tablet, TAKE 1 TABLET BY MOUTH ONE TIME A DAY, Disp: , Rfl: 11  .  ONE TOUCH ULTRA TEST strip, CHECK BLOOD SUGAR  ONCE DAILY., Disp: , Rfl: 3  .  ibuprofen (ADVIL;MOTRIN) 800 MG tablet, Take 800 mg by mouth every 6 hours as needed for Pain, Disp: , Rfl:   .  VORTIoxetine (BRINTELLIX) 10 MG TABS tablet, Take 20 mg by mouth daily , Disp: , Rfl:   .  metformin (GLUCOPHAGE) 500 MG tablet, Take 500 mg by mouth nightly 1 tablet in am, 1 tablet with dinner, 2 tablets at bedtime, Disp: , Rfl:   .  zolpidem (AMBIEN) 10 MG tablet, Take 10 mg by mouth nightly Takes every night, Disp: , Rfl:   .  lorazepam (ATIVAN) 1 MG tablet, Take 1 mg by mouth every 6 hours as needed.  , Disp: , Rfl:     Past Medical History  Bayley  has a past medical history of Depression; Diabetes mellitus (HCC); Hyperlipidemia; Hypertension; Nausea & vomiting; Panic disorder; PONV (postoperative nausea and vomiting); Psychiatric problem; and Sleep apnea.    Social History  Avielle  reports that she has never smoked. She has never used smokeless tobacco. She reports that she does not drink alcohol or use drugs.    Family History  Courtnie family history includes  Diabetes in an other family member; Heart Disease in an other family member; Hypertension in an other family member; Mental Illness in an other family member; Other in an other family member.    Past Surgical History   Past Surgical History:   Procedure Laterality Date   . BACK SURGERY  [place]/28/2015    [place]   . CHOLECYSTECTOMY  2007   . COLONOSCOPY      2006   . HEMORRHOID SURGERY  05/2010   . HYSTERECTOMY  2007   . NECK SURGERY  2008 [facility]    ACDF   . NERVE BLOCK  10/29/2014    LUMBAR FACET INJECTION   . OTHER SURGICAL HISTORY  11/05/10    hemmroidectomy   . OTHER SURGICAL HISTORY Bilateral 01/06/2015    Lumbar Facet   . PR CREATE EARDRUM OPENING,GEN ANESTH Left 07/19/2016    MYRINGOTOMY TYPANOSTOMY TUBE PLACEMENT, LEFT performed by Geoffery LyonsSusan J Rossi, [facility]   . SKIN BIOPSY  2007    left cheek   . TYMPANOSTOMY TUBE PLACEMENT  2006,2011       Subjective:     Review of Systems    Constitution: Negative for decreased appetite, diaphoresis, fever, weakness and malaise/fatigue.   HENT: Negative for congestion, hearing loss and hoarse voice.    Eyes: Negative for blurred vision, discharge, double vision and pain.   Cardiovascular: Negative for chest pain, claudication, cyanosis, dyspnea on exertion, irregular heartbeat, leg swelling, near-syncope, orthopnea, palpitations, paroxysmal nocturnal dyspnea and syncope.   Respiratory: Negative for cough, hemoptysis, shortness of breath and sputum production.    Endocrine: Negative for cold intolerance, heat intolerance, polydipsia, polyphagia and polyuria.   Musculoskeletal: Negative for arthritis, back pain, falls, gout, joint pain and joint swelling.   Gastrointestinal: Negative for bloating, abdominal pain, anorexia, change in bowel habit, bowel incontinence, constipation and diarrhea.   Genitourinary: Negative for bladder incontinence, dysuria, flank pain, frequency and hematuria.   Neurological: Negative for difficulty with concentration, disturbances in coordination, focal weakness, headaches and numbness.   Psychiatric/Behavioral: Negative for altered mental status and depression.       All others reviewed and are negative.   Objective:     BP 130/68   Pulse 92   Ht 5' 6.5" (1.689 m)   Wt 181 lb (82.1 kg)   BMI 28.78 kg/m     Wt Readings from Last 3 Encounters:   01/14/17 181 lb (82.1 kg)   12/27/16 192 lb (87.1 kg)   10/28/16 190 lb (86.2 kg)     BP Readings from Last 3 Encounters:   01/14/17 130/68   12/27/16 129/76   09/28/16 133/82       Physical Exam   Constitutional: She is oriented to person, place, and time. She appears well-developed and well-nourished.   HENT:   Head: Normocephalic and atraumatic.   Eyes: Pupils are equal, round, and reactive to light. EOM are normal.   Neck: Normal range of motion. Neck supple. No JVD present.   Cardiovascular: Normal rate, regular rhythm, normal heart sounds and intact distal pulses.    No  murmur heard.  Pulmonary/Chest: Effort normal and breath sounds normal. No respiratory distress. She has no wheezes. She has no rales.   Abdominal: Soft. Bowel sounds are normal. She exhibits no distension. There is no tenderness.   Musculoskeletal: Normal range of motion. She exhibits no edema.   Neurological: She is alert and oriented to person, place, and time. No  cranial nerve deficit. Coordination normal.   Skin: Skin is warm and dry.   Psychiatric: She has a normal mood and affect.   Nursing note and vitals reviewed.      No results found for: CKTOTAL, CKMB, CKMBINDEX    Lab Results   Component Value Date    WBC 9.0 09/28/2016    RBC 5.07 09/28/2016    RBC 5.09 07/16/2016    HGB 14.7 09/28/2016    HCT 44.1 09/28/2016    MCV 87.0 09/28/2016    MCH 29.0 09/28/2016    MCHC 33.3 09/28/2016    RDW 12.9 07/16/2016    PLT 373 09/28/2016    MPV 9.0 09/28/2016       Lab Results   Component Value Date    NA 137 09/28/2016    K 4.0 09/28/2016    CL 96 09/28/2016    CO2 26 09/28/2016    BUN 18 09/28/2016    LABALBU 4.4 01/29/2015    CREATININE 0.8 09/28/2016    CALCIUM 9.8 09/28/2016    LABGLOM 73 09/28/2016    GLUCOSE 246 09/28/2016    GLUCOSE 169 07/15/2016       Lab Results   Component Value Date    ALKPHOS 100 01/29/2015    ALT 19 01/29/2015    AST 17 01/29/2015    PROT 7.1 01/29/2015    BILITOT 0.2 01/29/2015    BILIDIR <0.2 01/29/2015    LABALBU 4.4 01/29/2015       Lab Results   Component Value Date    MG 1.9 01/29/2015       Lab Results   Component Value Date    INR 0.93 11/13/2015         Lab Results   Component Value Date    LABA1C 6.2 02/23/2011       Lab Results   Component Value Date    TRIG 156 09/02/2011    HDL 43 09/02/2011    LDLCALC 115 09/02/2011       Lab Results   Component Value Date    TSH 5.380 01/29/2015         Testing Reviewed:      I haveindividually reviewed the below cardiac tests    EKG:SR, no ST-T changes    ECHO:   Results for orders placed during the hospital encounter of 09/06/14   ECHO  Complete 2D W Doppler W Color    Narrative Transthoracic Echocardiography Report (TTE)     Demographics      Patient Name     CHISTINE DEMATTEO Gender                Female      MR #             [id number]       Race                  Caucasian                                       Ethnicity      Account #        [id number]         Room Number      Accession Number [id number]       Date of Study         09/06/2014  Date of Birth    1955/02/16      Referring Physician   Domingo CockingKato, Julius DO                                                          Bowlus Beverly GustJames T MD      Age              61 year(s)      Sonographer           Barbette MerinoMaile Miller, RDCS                                       Interpreting          Domingo CockingKato, Julius DO                                    Physician     Procedure    Type of Study      TTE procedure:ECHOCARDIOGRAM COMPLETE 2D W DOPPLER W COLOR.     Procedure Date  Date: 09/06/2014 Start: 10:10 AM    Study Location: Echo Lab  Technical Quality: Adequate visualization    Indications:Chest pain.    Additional Medical History:Hypertension, Diabetes    Patient Status: Routine    Height: 66 inches Weight: 181 pounds BSA: 1.92 m^2 BMI: 29.21 kg/m^2    BP: 116/76 mmHg     Conclusions      Summary   Systolic function was normal.   Ejection fraction is visually estimated at 55%.   Mildly dilated right ventricle.      Signature      ----------------------------------------------------------------   Electronically signed by Domingo CockingKato, Julius DO (Interpreting   physician) on 09/06/2014 at 06:28 PM   ----------------------------------------------------------------      Findings      Mitral Valve   Structurally normal mitral valve.   Trace mitral regurgitation is present.      Aortic Valve   Structurally normal aortic valve.      Tricuspid Valve   Tricuspid valve is structurally normal.   trivial tricuspid regurgitation.      Pulmonic Valve   Pulmonic valve is structurally normal.      Left Atrium   Normal size left atrium.      Left  Ventricle   Systolic function was normal.   Ejection fraction is visually estimated at 55%.      Right Atrium   The right atrium is of normal size.      Right Ventricle   Mildly dilated right ventricle.      Pericardial Effusion   There is a trivial circumferential pericardial effusion noted.     M-Mode/2D Measurements & Calculations      LV Diastolic    LV Systolic Dimension: 2.7  AV Cusp Separation: 1.6 cmLA   Dimension: 3.8  cm                          Dimension: 2.9 cmAO Root   cm              LV Volume Diastolic: 62 ml  Dimension: 2.4 cm  LV FS:29 %      LV Volume Systolic: 27 ml   LV PW           LV EDV/LV EDV Index: 62   Diastolic: 0.8  ml/32 m^2LV ESV/LV ESV   cm              Index: 27 ml/14 m^2         RV Diastolic Dimension: 3 cm   Septum          EF Calculated: 56.5 %   Diastolic: 0.7                              LA/Aorta: 1.21   cm     Doppler Measurements & Calculations      MV Peak E-Wave: 69.6 cm/s   AV Peak Velocity: 142  LVOT Peak Velocity: 105   MV Peak A-Wave: 49.9 cm/s   cm/s                   cm/s   MV E/A Ratio: 1.39          AV Peak Gradient: 8.07 LVOT Peak Gradient: 4   MV Peak Gradient: 1.94 mmHg mmHg                   mmHg      MV Deceleration Time: 144                          TV Peak E-Wave: 52.8   msec                                               cm/s                                                      TV Peak A-Wave: 53.3                                                      cm/s   MV E' Septal Velocity: 8.48   cm/s                                               TV Peak Gradient: 1.12   MV A' Septal Velocity: 13.5 AV DVI (Vmax):0.74     mmHg   cm/s   MV E' Lateral Velocity:                            PV Peak Velocity: 69.1   13.6 cm/s                                          cm/s   MV A'  Lateral Velocity:                            PV Peak Gradient: 1.91   11.6 cm/s                                          mmHg   E/E' septal: 8.21   E/E' lateral: 5.12      [id number]%3d       STRESS: 10/28/16:  Summary  Lexiscan EKG stress test is not suggestive for ischemia.  Calculated gated LVEF 83 %.  The T.I.D. ratio was 1.08 .  Myocardial perfusion imaging is not suggestive for myocardial ischemia.  This study was negative for ischemia.    Recommendation  Clinical correlation is recommended.  Aggressive risk factor management.  Medical management.      CATH:    Assessment/Plan       Diagnosis Orders   1. ASCVD (arteriosclerotic cardiovascular disease)       ASCVD  HTN  HLD  DM    Denies any symptoms  Reviewed prior workup  Continue statin, losartan, cardizem  BP well control  The patient is asked to make an attempt to improve diet and exercise patterns to aid in medical management of this problem.  Advised patient to call office or seek immediate medical attention if there is any new onset of  any chest pain, sob, palpitations, lightheadedness, dizziness, orthopnea, PND or pedal edema.         Thank youfor allowing me to participate in the care of this patient.   Please do not hesitate to contact me for any further questions.     Return if symptoms worsen or fail to improve, for Review testing, Regular follow up.       Electronically signed by Waymon Amato, MD [facility]  01/14/2017 at 10:40 AM

## 2017-01-21 ENCOUNTER — Ambulatory Visit: Admit: 2017-01-21 | Discharge: 2017-01-21 | Payer: PRIVATE HEALTH INSURANCE | Attending: Family

## 2017-01-21 DIAGNOSIS — Z9622 Myringotomy tube(s) status: Secondary | ICD-10-CM

## 2017-01-21 NOTE — Progress Notes (Signed)
[facility]   [facility]  [address]  [address]  [place] [id number]  Dept: [phone]  Dept Fax: [phone]  Loc: [phone]    Kennedy BuckerSandra A Cobert is a 61 y.o. female who was referred by No ref. provider found for:  Chief Complaint   Patient presents with   . 6 Month Follow-Up     Patient is here for 6 month follow up for tubes   .    HPI:       Patient is here for postop check.  She had left PET placed 07/19/16 by Dr. Andrey Farmerossi for chronic left middle ear effusion.  She has not had any ear pain or ear drainage.        Subjective:        Review of Systems   Constitutional: Negative for activity change, appetite change, chills, diaphoresis, fatigue, fever and unexpected weight change.   HENT: Negative for congestion, dental problem, drooling, ear discharge, ear pain, facial swelling, hearing loss, mouth sores, nosebleeds, postnasal drip, rhinorrhea, sinus pressure, sneezing, sore throat, tinnitus, trouble swallowing and voice change.    Eyes: Negative for photophobia, pain, discharge, redness, itching and visual disturbance.   Respiratory: Negative for apnea, cough, choking, chest tightness, shortness of breath, wheezing and stridor.    Cardiovascular: Negative for chest pain, palpitations and leg swelling.   Gastrointestinal: Negative for abdominal distention, abdominal pain, anal bleeding, blood in stool, constipation, diarrhea, nausea, rectal pain and vomiting.   Endocrine: Negative for cold intolerance, heat intolerance, polydipsia, polyphagia and polyuria.   Genitourinary: Negative for decreased urine volume, difficulty urinating, dyspareunia, dysuria, enuresis, flank pain, frequency, genital sores, hematuria, menstrual problem, pelvic pain, urgency, vaginal bleeding, vaginal discharge and vaginal pain.   Musculoskeletal: Negative for arthralgias, back pain, gait problem, joint swelling, myalgias, neck pain and neck stiffness.   Skin: Negative for color  change, pallor, rash and wound.   Allergic/Immunologic: Negative for environmental allergies, food allergies and immunocompromised state.   Neurological: Negative for dizziness, tremors, seizures, syncope, facial asymmetry, speech difficulty, weakness, light-headedness, numbness and headaches.   Hematological: Negative for adenopathy. Does not bruise/bleed easily.   Psychiatric/Behavioral: Negative for agitation, behavioral problems, confusion, decreased concentration, dysphoric mood, hallucinations, self-injury, sleep disturbance and suicidal ideas. The patient is not nervous/anxious and is not hyperactive.        Objective:       Physical Exam     This is a 61 y.o. female. Patient is alert and oriented to person, place and time. Mood is happy. No respiratory distress. No nasal voice, no hoarseness. Not obviously hearing impaired.    Vitals:    01/21/17 1147   BP: 136/80   Pulse: 96   Resp: 16   Temp: 99.2 F (37.3 C)       External ears are normal: no scars, lesions or masses.   R External auditory canal clear and free of any pathology  L External auditory canal clear and free of any pathology   Tympanic membranes:  R intact, translucent                                            L tube in place-dry, patent    Data:  All of the past medical history, past surgical history, family history, social history, allergies and current  medications were reviewed.       Assessment & Plan:        1. Status post myringotomy with insertion of tube, left  - Discussed water precautions.  - Call for any ear drainage.  - Follow up every 6 months while tubes in place.      Return in about 6 months (around 07/22/2017) for tube check.

## 2017-03-29 ENCOUNTER — Encounter: Attending: Nurse Practitioner

## 2017-04-04 ENCOUNTER — Ambulatory Visit: Admit: 2017-04-04 | Discharge: 2017-04-04 | Payer: PRIVATE HEALTH INSURANCE | Attending: Nurse Practitioner

## 2017-04-04 DIAGNOSIS — M47816 Spondylosis without myelopathy or radiculopathy, lumbar region: Secondary | ICD-10-CM

## 2017-04-04 MED ORDER — HYDROCODONE-ACETAMINOPHEN 5-325 MG PO TABS
5-325 | ORAL_TABLET | Freq: Three times a day (TID) | ORAL | 0 refills | Status: AC | PRN
Start: 2017-04-04 — End: 2017-05-04

## 2017-04-04 MED ORDER — NALOXONE HCL 4 MG/0.1ML NA LIQD
4 | NASAL | 0 refills | Status: DC | PRN
Start: 2017-04-04 — End: 2018-05-09

## 2017-04-04 NOTE — Telephone Encounter (Signed)
Spoke with pt, giving the message about not having the badge.  Pt had not checked MyChart.

## 2017-04-04 NOTE — Progress Notes (Signed)
[facility]    Physical Medicine & Rehabilitation  Outpatient progress note    Chief Complaint:   Chief Complaint   Patient presents with   ??? Follow-up     Here today for 3 month f/up         Subjective: Theresa Vargas is a 62 y.o. female who returns to the office today for further follow up. Pain in her lower back and bilateral legs is stable. She continues to use Norco PRN for pain which is effective- uses sparingly. Last dose was this morning. Continues to try to stay active.     Medications reviewed. Patient denies side effects with medications. Patient states she is taking medications as prescribed. She denies receiving pain medications from other sources. She denies any ER visits since last visit.    Pain scale with out pain medications or at its worst is 6/10.  Pain scale with pain medications or at its best is 2/10.  Last dose of norco was today  Drug screen reviewed and was appropriate  Patient does not have naloxone available at home. Patient has not required use of naloxone at home since last office visit.     Review of Systems:  CONSTITUTIONAL: negative  RESPIRATORY: negative  CARDIOVASCULAR: negative  GASTROINTESTINAL: negative  GENITOURINARY: negative  MUSCULOSKELETAL: positive for back and neck pain  NEUROLOGICAL: positive for pain  BEHAVIOR/PSYCH: negative  10 point system review otherwise negative    Physical Exam:  BP 128/80 (Site: Left Upper Arm, Position: Sitting, Cuff Size: Large Adult)    Pulse 94    Ht 5' 6.5" (1.689 m)    Wt 193 lb 2 oz (87.6 kg)    BMI 30.71 kg/m??     awake  Orientation:   person, place, time  Mood: within normal limits  Affect: calm  General appearance: Patient is well nourished, well developed, well groomed and in no acute distress    Memory:   normal,   Attention/Concentration: normal  Language:  normal    Cranial Nerves:  cranial nerves II-XII are grossly intact  ROM:  Decreased range of motion in cervical and lumbar  region  Diffuse tenderness throughout cervical and lumbar back.  Motor Exam:  Motor exam is symmetrical 5 out of 5 all extremities bilaterally  Tone:  normal  Muscle bulk: within normal limits  Sensory:  Abnormal sensation on top of left foot  Coordination:   normal  Deep Tendon Reflexes:  Reflexes are intact and symmetrical bilaterally    Skin: warm and dry, no rash or erythema  Peripheral vascular: Pulses: Normal upper and lower extremity pulses; Edema: no      Impression:  ?? Lumbar back pain   ?? Lumbar spondylosis  ?? Left lower limb radiculopathy  ?? Left cervical radiculopathy s/p prior ACDF 2008  ?? Plantar fasciitis  ?? Lumbar laminectomy Oct 2017    Plan:  ?? Ongoing follow up with pain management, patient does not wish to receive injections at this time  ?? Increase Norco to three times daily PRN  ?? Continue with physical activity and weight loss efforts  ?? [place]ARRS reviewed. Current MED: 15  ?? Patient was offered naloxone for home. RX sent.   ?? Discussed long term side effects of medications, tolerance, dependency and addiction.  ?? Previous UDS reviewed  ?? UDS preformed today for compliance.  ?? Patient told can not receive any pain medications from any other source.  ?? No evidence of abuse,  diversion or aberrant behavior.  ??? Medications and/or procedures to improve function and quality of life- patient understanding with this and that may not be pain free  ??? Discussed with patient about safe storage of medications at home  ??? Discussed possible weaning of medication dosing dependent on treatment/procedure results.     Will continue to monitor any benefits vs side effects of the medications as prescribed.  The patient has been warned about the risk of operating machinery including driving if impaired in any way by these medications.  The patient also accepts the risks of tolerance, dependency, or addiction related to the prescribed medications.  All questions were answered.  Reevaluation as planned, or sooner if  requested.     Controlled Substances Monitoring: Attestation: The Prescription Monitoring Report for this patient was reviewed today. Marland KitchenRocky Morel, APRN - CNP)  Chronic Pain Routine Monitoring: Possible medication side effects, risk of tolerance/dependence & alternative treatments discussed., No signs of potential drug abuse or diversion identified: otherwise, see note documentation, Random urine drug screen sent today. Marland KitchenRocky Morel, APRN - CNP)    Return in about 3 months (around 07/05/2017).     It was my pleasure to evaluate Theresa Vargas today.  Please call with any concerns or questions.  15 minutes spent in evaluation efforts    Rocky Morel, APRN - CNP

## 2017-04-04 NOTE — Addendum Note (Signed)
Addended by: Rocky MorelLEININGER, Keane Martelli N on: 04/04/2017 05:30 PM     Modules accepted: Orders

## 2017-07-04 ENCOUNTER — Ambulatory Visit: Admit: 2017-07-04 | Discharge: 2017-07-04 | Payer: PRIVATE HEALTH INSURANCE | Attending: Nurse Practitioner

## 2017-07-04 DIAGNOSIS — M47816 Spondylosis without myelopathy or radiculopathy, lumbar region: Secondary | ICD-10-CM

## 2017-07-04 MED ORDER — HYDROCODONE-ACETAMINOPHEN 5-325 MG PO TABS
5-325 | ORAL_TABLET | Freq: Three times a day (TID) | ORAL | 0 refills | Status: AC | PRN
Start: 2017-07-04 — End: 2017-08-03

## 2017-07-04 NOTE — Progress Notes (Signed)
[facility]    Physical Medicine & Rehabilitation  Outpatient progress note    Chief Complaint:   Chief Complaint   Patient presents with   ??? Follow-up     lumbar spondylosis        Subjective: Theresa Vargas is a 62 y.o. female who returns to the office today for further follow up. Low back pain is stable and well controlled. She is taking the Norco which is effective.    Patient reports that she still has a "fluttering" sensation on the top of her right foot. Is in her toes and can extend onto the top of her foot sometimes. Can get a burning sensation that bothers her, especially at night. She puts ice on it sometimes which can help with the symptoms. Discussed that it could be radicular symptoms coming from her back. Can do imaging or EMG to confirm. Also discussed different neuropathic medications that can be used to help with the symptoms. Patient does not want to pursue any of these options at this time.     Medications reviewed. Patient denies side effects with medications. Patient states she is taking medications as prescribed. She denies receiving pain medications from other sources. She denies any ER visits since last visit.    Pain scale with out pain medications or at its worst is 5/10.  Pain scale with pain medications or at its best is 2/10.  Last dose of norco was today  Drug screen reviewed and was appropriate  Patient does not have naloxone available at home. Patient has not required use of naloxone at home since last office visit.     Review of Systems:  CONSTITUTIONAL: negative  RESPIRATORY: negative  CARDIOVASCULAR: negative  GASTROINTESTINAL: negative  GENITOURINARY: negative  MUSCULOSKELETAL: positive for back and neck pain  NEUROLOGICAL: positive for pain  BEHAVIOR/PSYCH: negative  10 point system review otherwise negative    Physical Exam:  BP 130/72 (Site: Left Upper Arm, Position: Sitting, Cuff Size: Medium Adult)    Pulse 73    Ht 5' 6.45" (1.688 m)     Wt 199 lb 9.6 oz (90.5 kg)    BMI 31.78 kg/m??     awake  Orientation:   person, place, time  Mood: within normal limits  Affect: calm  General appearance: Patient is well nourished, well developed, well groomed and in no acute distress    Memory:   normal,   Attention/Concentration: normal  Language:  normal    Cranial Nerves:  cranial nerves II-XII are grossly intact  ROM:  Decreased range of motion in cervical and lumbar region  Diffuse tenderness throughout cervical and lumbar back.  Motor Exam:  Motor exam is symmetrical 5 out of 5 all extremities bilaterally  Tone:  normal  Muscle bulk: within normal limits  Sensory:  Abnormal sensation on top of left foot  Coordination:   normal  Deep Tendon Reflexes:  Reflexes are intact and symmetrical bilaterally    Skin: warm and dry, no rash or erythema  Peripheral vascular: Pulses: Normal upper and lower extremity pulses; Edema: no      Impression:  ?? Lumbar back pain   ?? Lumbar spondylosis  ?? Left lower limb radiculopathy  ?? Left cervical radiculopathy s/p prior ACDF 2008  ?? Plantar fasciitis  ?? Lumbar laminectomy Oct 2017    Plan:  ?? Continue Norco three times daily PRN  ?? Continue with physical activity and weight loss efforts  ?? OARRS reviewed.  Current MED: 15  ?? Patient was offered naloxone for home. RX sent 04/04/2017.   ?? Discussed long term side effects of medications, tolerance, dependency and addiction.   ?? Previous UDS reviewed  ?? UDS preformed today for compliance.  ?? Patient told can not receive any pain medications from any other source.  ?? No evidence of abuse, diversion or aberrant behavior.  ??? Medications and/or procedures to improve function and quality of life- patient understanding with this and that may not be pain free  ??? Discussed with patient about safe storage of medications at home  ??? Discussed possible weaning of medication dosing dependent on treatment/procedure results.     Will continue to monitor any benefits vs side effects of the  medications as prescribed.  The patient has been warned about the risk of operating machinery including driving if impaired in any way by these medications.  The patient also accepts the risks of tolerance, dependency, or addiction related to the prescribed medications.  All questions were answered.  Reevaluation as planned, or sooner if requested.     Controlled Substances Monitoring: Attestation: The Prescription Monitoring Report for this patient was reviewed today. Marland Kitchen(Rocky MorelBrittany N Nivin Braniff, APRN - CNP)  Chronic Pain Routine Monitoring: Possible medication side effects, risk of tolerance/dependence & alternative treatments discussed., Random urine drug screen sent today., No signs of potential drug abuse or diversion identified: otherwise, see note documentation Rocky Morel(Salvatore Poe N Tyrik Stetzer, APRN - CNP)    Return in about 3 months (around 10/04/2017).     It was my pleasure to evaluate Theresa Vargas today.  Please call with any concerns or questions.  15 minutes spent in evaluation efforts    Rocky MorelBrittany N Antrice Pal, APRN - CNP

## 2017-07-07 ENCOUNTER — Ambulatory Visit: Admit: 2017-07-07 | Discharge: 2017-07-07 | Payer: PRIVATE HEALTH INSURANCE | Attending: Physician Assistant

## 2017-07-07 DIAGNOSIS — Z9989 Dependence on other enabling machines and devices: Secondary | ICD-10-CM

## 2017-07-07 NOTE — Progress Notes (Signed)
[facility]      Theresa Vargas         [id number]  07/07/2017   Chief Complaint   Patient presents with   . 1 Year Follow Up     OSA with a Download         Pt of Dr. Jenetta Downer    PAP Download:   Original or initialAHI: 14.3     Date of initial study: 09-20-14      Compliant  93%     Noncompliant       PAP Type CPAP  Level  7 cmh20   Avg Hrs/Day 8:32  AHI: 1.3   Recorded compliance dates , 06-05-17 to 07-04-17  Machine/Mfg: ResMed    Interface:Nasal    Provider:    __[facility]           _x_[facility]        __ Elisha Headland    __ Patsy Lager            __[facility] __[facility]   __[facility]:       __Other    Neck Size: 13.5  Mallampati 4  ESS: 0   SAQLI: 31    Here is a scan of the most recent download:          Presentation:   Theresa Vargas presents for sleep medicine follow up for obstructive sleep apnea  Since the last visit, Theresa Vargas is doing well with PAP.  She feels tired all the time related to her depression.  She gets a good night sleep.    Equipment issues:  The pressure is  acceptable, the mask is acceptable and she  is  using the humidity.    Sleep issues:  Do you feel better? Yes  More rested?Yes   Better concentration? yes    Progress History:   Since last visit any new medical issues? Yes eat tube  New ER or hospitlal visits? No  Any new or changes in medicines? No  Any new sleep medicines? No        Past Medical History:   Diagnosis Date   . Depression    . Diabetes mellitus (HCC)    . Hyperlipidemia    . Hypertension    . Nausea & vomiting    . Panic disorder    . PONV (postoperative nausea and vomiting)    . Psychiatric problem    . Sleep apnea        Past Surgical History:   Procedure Laterality Date   . BACK SURGERY  [place]   . CHOLECYSTECTOMY  2007   . COLONOSCOPY      2006   . HEMORRHOID SURGERY  05/2010   . HYSTERECTOMY  2007   . NECK SURGERY  2008 [facility]    ACDF   . NERVE BLOCK  10/29/2014    LUMBAR FACET INJECTION   . OTHER SURGICAL HISTORY  11/05/10     hemmroidectomy   . OTHER SURGICAL HISTORY Bilateral 01/06/2015    Lumbar Facet   . PR CREATE EARDRUM OPENING,GEN ANESTH Left 07/19/2016    MYRINGOTOMY TYPANOSTOMY TUBE PLACEMENT, LEFT performed by Geoffery Lyons, [facility] at [facility] OR   . SKIN BIOPSY  2007    left cheek   . TYMPANOSTOMY TUBE PLACEMENT  2006,2011       Social History     Tobacco Use   . Smoking status: Never Smoker   .  Smokeless tobacco: Never Used   Substance Use Topics   . Alcohol use: No   . Drug use: No       No Known Allergies    Current Outpatient Medications   Medication Sig Dispense Refill   . HYDROcodone-acetaminophen (NORCO) 5-325 MG per tablet Take 1 tablet by mouth 3 times daily as needed for Pain for up to 30 days. Fill on/after 09/01/2017. OK to fill early. 90 tablet 0   . HYDROcodone-acetaminophen (NORCO) 5-325 MG per tablet Take 1 tablet by mouth 3 times daily as needed for Pain for up to 30 days. Fill on/after 08/03/2017 90 tablet 0   . HYDROcodone-acetaminophen (NORCO) 5-325 MG per tablet Take 1 tablet by mouth 3 times daily as needed for Pain for up to 30 days. 90 tablet 0   . naloxone (NARCAN) 4 MG/0.1ML LIQD nasal spray 1 spray by Nasal route as needed for Opioid Reversal 1 each 0   . SITagliptin (JANUVIA) 50 MG tablet Take 50 mg by mouth daily     . DULoxetine (CYMBALTA) 60 MG extended release capsule Take 60 mg by mouth daily     . DULoxetine (CYMBALTA) 30 MG extended release capsule Take 30 mg by mouth daily     . diltiazem (CARDIZEM CD) 240 MG extended release capsule Take 1 capsule by mouth daily (Patient taking differently: Take 300 mg by mouth daily ) 30 capsule 11   . pravastatin (PRAVACHOL) 10 MG tablet Take 20 mg by mouth daily      . Coenzyme Q10 (CO Q-10) 100 MG CAPS Take by mouth daily     . losartan-hydrochlorothiazide (HYZAAR) 100-25 MG per tablet TAKE 1 TABLET BY MOUTH ONE TIME A DAY  11   . ONE TOUCH ULTRA TEST strip CHECK BLOOD SUGAR ONCE DAILY.  3   . ibuprofen (ADVIL;MOTRIN) 800 MG tablet Take 800 mg by mouth  every 6 hours as needed for Pain     . VORTIoxetine (BRINTELLIX) 10 MG TABS tablet Take 20 mg by mouth daily      . metformin (GLUCOPHAGE) 500 MG tablet Take 500 mg by mouth nightly 1 tablet in am, 1 tablet with dinner, 2 tablets at bedtime     . zolpidem (AMBIEN) 10 MG tablet Take 10 mg by mouth nightly Takes every night     . lorazepam (ATIVAN) 1 MG tablet Take 1 mg by mouth every 6 hours as needed.         No current facility-administered medications for this visit.        Family History   Problem Relation Age of Onset   . Diabetes Other    . Hypertension Other    . Mental Illness Other    . Heart Disease Other    . Other Other         bone and joint problems        Review of Systems -   Review of Systems   Constitutional: Negative for activity change, appetite change, chills and fever.   HENT: Negative for congestion and postnasal drip.    Eyes: Negative.    Respiratory: Negative for cough, chest tightness, shortness of breath, wheezing and stridor.    Cardiovascular: Negative for chest pain and leg swelling.   Gastrointestinal: Negative for diarrhea and nausea.   Endocrine: Negative.    Genitourinary: Negative.    Musculoskeletal: Positive for back pain. Negative for arthralgias.   Skin: Negative.    Allergic/Immunologic: Negative.  Neurological: Negative.  Negative for dizziness and light-headedness.   Psychiatric/Behavioral: Negative.    All other systems reviewed and are negative.       Physical Exam:    BMI:  Body mass index is 31.64 kg/m.    Wt Readings from Last 3 Encounters:   07/07/17 196 lb (88.9 kg)   07/04/17 199 lb 9.6 oz (90.5 kg)   04/04/17 193 lb 2 oz (87.6 kg)     Weight stable / unchanged  Vitals: BP 116/72 (Site: Left Upper Arm, Position: Sitting, Cuff Size: Medium Adult)   Pulse 98   Ht 5\' 6"  (1.676 m)   Wt 196 lb (88.9 kg)   SpO2 98% Comment: on RA  BMI 31.64 kg/m       Physical Exam   Constitutional: She is oriented to person, place, and time. She appears well-developed and  well-nourished.   HENT:   Head: Normocephalic and atraumatic.   Right Ear: External ear normal.   Left Ear: External ear normal.   Mouth/Throat: Oropharynx is clear and moist.   Eyes: Pupils are equal, round, and reactive to light. Conjunctivae and EOM are normal.   Neck: Normal range of motion. Neck supple.   Cardiovascular: Normal rate, regular rhythm and normal heart sounds.   Pulmonary/Chest: Effort normal and breath sounds normal.   Abdominal: Soft.   Musculoskeletal: Normal range of motion.   Neurological: She is alert and oriented to person, place, and time.   Skin: Skin is warm and dry.   Psychiatric: She has a normal mood and affect. Her behavior is normal. Judgment and thought content normal.         ASSESSMENT/DIAGNOSIS     Diagnosis Orders   1. OSA on CPAP     2. Obesity (BMI 30-39.9)              Plan   Do you need any equipment today? Yes wants to try nasal pillows    - She  was advised to continue current positive airway pressure therapy with above described pressure.   - She  advised to keep goodcompliance with current recommended pressure to get optimal results and clinical improvement  - Recommend 7-9 hours of sleep with PAP  - She was advised to call DME company regarding supplies if needed.   -She call my office for earlier appointment if needed for worsening of sleep symptoms.   - She was instructed on weight loss  - Theresa Vargas was educated about my impression and plan. Patient verbalizesunderstanding.  We will see Theresa Vargas back in: 1 year with download    Information added by my medical assistant/LPN was reviewed today         Jamie Brookes PA-C, MPAS  07/07/2017

## 2017-07-21 MED ORDER — CPAP MACHINE
0 refills | Status: DC
Start: 2017-07-21 — End: 2017-08-01

## 2017-07-21 NOTE — Telephone Encounter (Signed)
If her PAP needs repaired, they will send it in to be repaired.  She will not get a new PAP because hers is less than 60 years old

## 2017-07-21 NOTE — Telephone Encounter (Signed)
Pt called and and had [facility] to [facility] because her machine is not shutting off by itself. They are telling her they need an order to check the machine.  York SpanielSaid it is not under warranty according to them but it is less than 60 years old possibly.Can you word this so that if a new machine is needed they let us know 1st.  Thank you.

## 2017-07-22 ENCOUNTER — Encounter: Attending: Family

## 2017-07-22 NOTE — Telephone Encounter (Signed)
They told her they need an order to check it out. Thank you

## 2017-08-01 MED ORDER — CPAP MACHINE
0 refills | Status: AC
Start: 2017-08-01 — End: ?

## 2017-08-01 NOTE — Telephone Encounter (Signed)
Order placed

## 2017-09-19 ENCOUNTER — Encounter: Payer: PRIVATE HEALTH INSURANCE | Attending: Family

## 2017-09-21 ENCOUNTER — Ambulatory Visit: Admit: 2017-09-21 | Discharge: 2017-09-21 | Payer: PRIVATE HEALTH INSURANCE | Attending: Family

## 2017-09-21 DIAGNOSIS — Z9622 Myringotomy tube(s) status: Secondary | ICD-10-CM

## 2017-09-21 NOTE — Progress Notes (Signed)
[facility]  [facility]  [address]  [address]  [place] [id number]  Dept: [phone]  Dept Fax: [phone]  Loc: [phone]    Kennedy BuckerSandra A Gasparini is a 62 y.o. female who was referred by No ref. provider found for:  Chief Complaint   Patient presents with   ??? 6 Month Follow-Up     Patient here for Tube Check   .    HPI:       Patient here for 6 month tube check.  She had left PET placed 07/19/16 by Dr. Andrey Farmerossi for chronic left middle ear effusion.  She denies any complaints today.  Recently seen with Dr Seleta RhymesBowlus and unsure whether tube is still in place.  She is flying to go on a cruise next week.         Subjective:        Review of Systems   Constitutional: Negative.    HENT: Negative.    Eyes: Negative.    Respiratory: Negative.    Cardiovascular: Negative.    Gastrointestinal: Negative.    Endocrine: Negative.    Genitourinary: Negative.    Musculoskeletal: Negative.    Allergic/Immunologic: Negative.    Neurological: Negative.    Hematological: Negative.    Psychiatric/Behavioral: Negative.        Objective:       Physical Exam     This is a 62 y.o. female. Patient is alert and oriented to person, place and time. Mood is happy. No respiratory distress. No nasal voice, no hoarseness. Not obviously hearing impaired.    Vitals:    09/21/17 1420   BP: (!) 142/96   Pulse:    Resp:    Temp:        External ears are normal: no scars, lesions or masses.   R External auditory canal small amount of cerumen removed with wire loop under magnfication  L External auditory canal small amount of cerumen removed with wire loop under magnfication  Tympanic membranes:  R intact, translucent                                            L T tube in place- dry, patent (Tube was placed in posterior-inferior TM and is a longer 'T tube' with crusting around it, making it appear to be extruded. Able to visualize the flanges of the tube underneath the TM.)    Data:  All of the past  medical history, past surgical history, family history, social history, allergies and current medications were reviewed.       Assessment & Plan:        1. Status post myringotomy with insertion of tube, left    - Given instructions for use of Afrin prior to flying.   - Discussed water precautions.  - Call for any ear drainage.  - Follow up every 6 months while tubes in place.        Return in about 6 months (around 03/24/2018) for tube check.

## 2017-09-26 ENCOUNTER — Encounter

## 2017-09-26 MED ORDER — HYDROCODONE-ACETAMINOPHEN 5-325 MG PO TABS
5-325 MG | ORAL_TABLET | Freq: Four times a day (QID) | ORAL | 0 refills | Status: AC | PRN
Start: 2017-09-26 — End: 2017-10-29

## 2017-09-26 NOTE — Telephone Encounter (Signed)
OARRS reviewed. UDS: + for  consistent. Last seen: 07/04/2017. Follow-up: 11/28/2017 with [place], CNP    Patient asking to fill 3 days early because she is going out of town. Set up to fill 3 days early if ok.     Do I need to move her appointment up with Dr. Idamae Schullerapulong? The October visit was scheduled at her last visit.

## 2017-09-26 NOTE — Telephone Encounter (Signed)
Script signed and sent to pharmacy.  I would move up to somewhere within a 30 day window for Dr. Idamae Schullerapulong

## 2017-09-26 NOTE — Telephone Encounter (Signed)
Kennedy BuckerSandra A Shutters called requesting a refill on the following medications:  Requested Prescriptions     Pending Prescriptions Disp Refills   ??? HYDROcodone-acetaminophen (NORCO) 5-325 MG per tablet  0     [facility]      Date of last visit: 07/04/17  Date of next visit (if applicable): 11/28/2017

## 2017-10-20 ENCOUNTER — Inpatient Hospital Stay: Admit: 2017-10-20 | Payer: PRIVATE HEALTH INSURANCE

## 2017-10-20 ENCOUNTER — Encounter

## 2017-10-20 DIAGNOSIS — Z1231 Encounter for screening mammogram for malignant neoplasm of breast: Secondary | ICD-10-CM

## 2017-10-27 ENCOUNTER — Encounter

## 2017-10-27 ENCOUNTER — Inpatient Hospital Stay: Admit: 2017-10-27 | Payer: PRIVATE HEALTH INSURANCE

## 2017-10-27 ENCOUNTER — Ambulatory Visit: Payer: PRIVATE HEALTH INSURANCE

## 2017-10-27 DIAGNOSIS — R922 Inconclusive mammogram: Secondary | ICD-10-CM

## 2017-10-29 ENCOUNTER — Encounter

## 2017-10-31 ENCOUNTER — Encounter

## 2017-10-31 NOTE — Telephone Encounter (Signed)
OARRS reviewed. UDS: + for    Lorazepam POSITIVE CONSISTENT  Hydrocodone POSITIVE CONSISTENT  Hydromorphone POSITIVE CONSISTENT  Norhydrocodone POSITIVE CONSISTENT      Last seen: 07/04/2017.       Follow-up:   Future Appointments   Date Time Provider Department Center   11/28/2017  3:00 PM Rocky Morel, APRN - CNP [facility] Pain [facility]   03/22/2018  3:15 PM Robinette Haines Dunifon, APRN - CNP ENT [facility] [place]   07/10/2018  1:45 PM Shauna Hugh, PA-C Pulm Med [facility] - [place]

## 2017-10-31 NOTE — Telephone Encounter (Signed)
From: Kennedy Bucker  To: Rocky Morel, APRN - CNP  Sent: 10/31/2017 3:48 PM EDT  Subject: Prescription Question    did you send a refill request to dr own or brittany for my refill request. thanks sandy

## 2017-10-31 NOTE — Telephone Encounter (Signed)
From: Kennedy Bucker  To: Rocky Morel, APRN - CNP  Sent: 10/31/2017 11:20 AM EDT  Subject: Prescription Question    i need a refill in my norco. thanks sandy Audley Hose

## 2017-11-01 ENCOUNTER — Encounter

## 2017-11-01 MED ORDER — HYDROCODONE-ACETAMINOPHEN 5-325 MG PO TABS
5-325 | ORAL_TABLET | Freq: Four times a day (QID) | ORAL | 0 refills | Status: DC | PRN
Start: 2017-11-01 — End: 2017-11-28

## 2017-11-28 ENCOUNTER — Encounter

## 2017-11-28 ENCOUNTER — Encounter: Attending: Nurse Practitioner

## 2017-11-28 NOTE — Telephone Encounter (Signed)
OARRS reviewed. UDS: + for  Lorazepam and norco. Narcan offered: yes  Last seen: 07/04/2017. Follow-up: 12/12/17

## 2017-11-28 NOTE — Telephone Encounter (Signed)
Theresa Vargas requested a refill of Norco.  Please call this into the patient's pharmacy.

## 2017-11-29 MED ORDER — HYDROCODONE-ACETAMINOPHEN 5-325 MG PO TABS
5-325 MG | ORAL_TABLET | Freq: Four times a day (QID) | ORAL | 0 refills | Status: DC | PRN
Start: 2017-11-29 — End: 2017-12-12

## 2017-12-12 ENCOUNTER — Ambulatory Visit: Admit: 2017-12-12 | Discharge: 2017-12-12 | Payer: PRIVATE HEALTH INSURANCE | Attending: Nurse Practitioner

## 2017-12-12 DIAGNOSIS — M47816 Spondylosis without myelopathy or radiculopathy, lumbar region: Secondary | ICD-10-CM

## 2017-12-12 MED ORDER — HYDROCODONE-ACETAMINOPHEN 5-325 MG PO TABS
5-325 MG | ORAL_TABLET | Freq: Four times a day (QID) | ORAL | 0 refills | Status: AC | PRN
Start: 2017-12-12 — End: 2018-01-11

## 2017-12-12 NOTE — Progress Notes (Signed)
[facility]    Physical Medicine & Rehabilitation  Outpatient progress note    Chief Complaint:   Chief Complaint   Patient presents with   ??? Follow-up     low back pain        Subjective: Theresa Vargas is a 62 y.o. female who returns to the office today for further follow up. Low back pain is stable and well controlled. She is taking the Norco which is effective. She used to get 120 tabs, now only gets 90. Is unsure why this happened. OK with increasing back up to 120 as she sometimes needs an extra dose during the day.     "Fluttering" sensation on the top of her foot has resolved. She is not exercising as much due to the cold weather, she feels like the exercise is what was making it occur. Thinks maybe she was "overdoing" it.     Feeling slightly depressed. Has seen her PCP who has adjusted antidepressants. Due to this has been less active.     Medications reviewed. Patient denies side effects with medications. Patient states she is taking medications as prescribed. She denies receiving pain medications from other sources. She denies any ER visits since last visit.    Pain scale with out pain medications or at its worst is 5/10.  Pain scale with pain medications or at its best is 2/10.  Last dose of norco was today  Drug screen reviewed and was appropriate  Patient does not have naloxone available at home. Patient has not required use of naloxone at home since last office visit.     Review of Systems:  Review of Systems   Constitutional: Positive for fatigue. Negative for chills and fever.   HENT: Negative for congestion and trouble swallowing.    Eyes: Negative for pain and redness.   Respiratory: Negative for cough and shortness of breath.    Cardiovascular: Negative for chest pain and leg swelling.   Gastrointestinal: Negative for constipation, diarrhea and nausea.   Endocrine: Negative for cold intolerance and heat intolerance.   Genitourinary: Negative for  difficulty urinating and frequency.   Musculoskeletal: Positive for back pain and neck pain. Negative for joint swelling.   Skin: Negative for rash.   Neurological: Negative for weakness, light-headedness and numbness.   Hematological: Negative for adenopathy.   Psychiatric/Behavioral: Negative for suicidal ideas. The patient is nervous/anxious.         Depression           Physical Exam:  BP 130/78 (Site: Left Upper Arm, Position: Sitting, Cuff Size: Large Adult)    Pulse 72    Ht 5\' 6"  (1.676 m)    Wt 191 lb (86.6 kg)    BMI 30.83 kg/m??     awake  Orientation:   person, place, time  Mood: within normal limits  Affect: calm  General appearance: Patient is well nourished, well developed, well groomed and in no acute distress    Memory:   normal,   Attention/Concentration: normal  Language:  normal    Cranial Nerves:  cranial nerves II-XII are grossly intact  ROM:  Decreased range of motion in cervical and lumbar region  Diffuse tenderness throughout cervical and lumbar back.  Motor Exam:  Motor exam is symmetrical 5 out of 5 all extremities bilaterally  Tone:  normal  Muscle bulk: within normal limits  Sensory:  Normal  Coordination:   normal  Deep Tendon Reflexes:  Reflexes are  intact and symmetrical bilaterally    Skin: warm and dry, no rash or erythema  Peripheral vascular: Pulses: Normal upper and lower extremity pulses; Edema: no      Impression:  ?? Lumbar back pain   ?? Lumbar spondylosis  ?? Left lower limb radiculopathy  ?? Left cervical radiculopathy s/p prior ACDF 2008  ?? Plantar fasciitis  ?? Lumbar laminectomy Oct 2017    Plan:  ?? Increase Norco back to 4 times daily.   ?? Encouraged continued physical activity  ?? OARRS reviewed. Current MED: 15  ?? Patient was offered naloxone for home. RX sent 04/04/2017.   ?? Discussed long term side effects of medications, tolerance, dependency and addiction.   ?? Previous UDS reviewed from 07/04/2017  ?? UDS preformed today for compliance.  ?? Patient told can not receive any  pain medications from any other source.  ?? No evidence of abuse, diversion or aberrant behavior.  ??? Medications and/or procedures to improve function and quality of life- patient understanding with this and that may not be pain free  ??? Discussed with patient about safe storage of medications at home  ??? Discussed possible weaning of medication dosing dependent on treatment/procedure results.     Will continue to monitor any benefits vs side effects of the medications as prescribed.  The patient has been warned about the risk of operating machinery including driving if impaired in any way by these medications.  The patient also accepts the risks of tolerance, dependency, or addiction related to the prescribed medications.  All questions were answered.  Reevaluation as planned, or sooner if requested.     Controlled Substances Monitoring: Periodic Controlled Substance Monitoring: Possible medication side effects, risk of tolerance/dependence & alternative treatments discussed., No signs of potential drug abuse or diversion identified., Random urine drug screen sent today. Marland KitchenRocky Morel, APRN - CNP)    Return in about 3 months (around 03/14/2018).     It was my pleasure to evaluate Theresa Vargas today.  Please call with any concerns or questions.  15 minutes spent in evaluation efforts    Rocky Morel, APRN - CNP

## 2018-02-08 NOTE — Telephone Encounter (Signed)
Hold Norco until done with tylenol III.

## 2018-02-08 NOTE — Telephone Encounter (Signed)
Patient called in, states she has a toothache and her dentist is going to prescribe her tylenol with codeine. [facility] needs approval from [place] to fill. Are you ok to let her fill and hold the norco from you? Please advise

## 2018-02-08 NOTE — Telephone Encounter (Signed)
Patient and pharmacy notified.

## 2018-03-10 ENCOUNTER — Ambulatory Visit: Admit: 2018-03-10 | Discharge: 2018-03-10 | Payer: PRIVATE HEALTH INSURANCE | Attending: Nurse Practitioner

## 2018-03-10 DIAGNOSIS — M47816 Spondylosis without myelopathy or radiculopathy, lumbar region: Secondary | ICD-10-CM

## 2018-03-10 MED ORDER — HYDROCODONE-ACETAMINOPHEN 5-325 MG PO TABS
5-325 MG | ORAL_TABLET | Freq: Four times a day (QID) | ORAL | 0 refills | Status: AC | PRN
Start: 2018-03-10 — End: 2018-04-09

## 2018-03-10 NOTE — Progress Notes (Signed)
[facility]    Physical Medicine & Rehabilitation  Outpatient progress note    Chief Complaint:   Chief Complaint   Patient presents with   ??? Follow-up     3 month follow up        Subjective: Theresa Vargas is a 63 y.o. female who returns to the office today for further follow up. Low back pain is stable and well controlled. She is taking the Norco which is effective. Trying to remains active and start her exercise routine back up. Since starting the treadmill again, the fluttering sensation on her right foot has returned. Only occurs intermittently. She uses ice on her foot which seems to help. Not painful or bothersome at this time. Discussed if it worsens or becomes painful to let us know. May need to pursue back imaging/EMG if becomes painful/intolerable.     Leaving for vacation in [place] Feb 16th, returning March 4th. Due for Norco at end of February. Discussed printing that RX for her to take to [place], as she is not sure what pharmacy will be close.      Medications reviewed. Patient denies side effects with medications. Patient states she is taking medications as prescribed. She denies receiving pain medications from other sources. She denies any ER visits since last visit.    Pain scale with out pain medications or at its worst is 5/10.  Pain scale with pain medications or at its best is 2/10.  Last dose of norco was today  Drug screen reviewed and was appropriate  Patient does not have naloxone available at home. Patient has not required use of naloxone at home since last office visit.     Review of Systems:  Review of Systems   Constitutional: Positive for fatigue. Negative for chills and fever.   HENT: Negative for congestion and trouble swallowing.    Eyes: Negative for pain and redness.   Respiratory: Negative for cough and shortness of breath.    Cardiovascular: Negative for chest pain and leg swelling.   Gastrointestinal: Negative for constipation,  diarrhea and nausea.   Endocrine: Negative for cold intolerance and heat intolerance.   Genitourinary: Negative for difficulty urinating and frequency.   Musculoskeletal: Positive for back pain and neck pain. Negative for joint swelling.   Skin: Negative for rash.   Neurological: Negative for weakness, light-headedness and numbness.   Hematological: Negative for adenopathy.   Psychiatric/Behavioral: Negative for suicidal ideas. The patient is nervous/anxious.         Depression       Physical Exam:  BP 132/78 (Site: Left Upper Arm, Position: Sitting, Cuff Size: Medium Adult)    Pulse 70    Ht 5\' 6"  (1.676 m)    Wt 199 lb (90.3 kg)    BMI 32.12 kg/m??     awake  Orientation:   person, place, time  Mood: within normal limits  Affect: calm  General appearance: Patient is well nourished, well developed, well groomed and in no acute distress    Memory:   normal,   Attention/Concentration: normal  Language:  normal    Cranial Nerves:  cranial nerves II-XII are grossly intact  ROM:  Decreased range of motion in cervical and lumbar region  Diffuse tenderness throughout cervical and lumbar back.  Motor Exam:  Motor exam is symmetrical 5 out of 5 all extremities bilaterally  Tone:  normal  Muscle bulk: within normal limits  Sensory:  Fluttering sensation on top of  right foot at times.   Coordination:   normal  Deep Tendon Reflexes:  Reflexes are intact and symmetrical bilaterally    Skin: warm and dry, no rash or erythema  Peripheral vascular: Pulses: Normal upper and lower extremity pulses; Edema: no      Impression:  ?? Lumbar back pain   ?? Lumbar spondylosis  ?? Left lower limb radiculopathy  ?? Left cervical radiculopathy s/p prior ACDF 2008  ?? Plantar fasciitis  ?? Lumbar laminectomy Oct 2017    Plan:  ?? Continue Norco 5-325 4 times daily PRN- paper RX given for feb refill in [place] while on vacation  ?? Encouraged continued physical activity  ?? OARRS reviewed. Current MED: 15  ?? Patient was offered naloxone for home. RX sent  04/04/2017.   ?? Discussed long term side effects of medications, tolerance, dependency and addiction.   ?? Previous UDS reviewed from 12/12/2017  ?? UDS preformed today for compliance.  ?? Patient told can not receive any pain medications from any other source.  ?? No evidence of abuse, diversion or aberrant behavior.  ??? Medications and/or procedures to improve function and quality of life- patient understanding with this and that may not be pain free  ??? Discussed with patient about safe storage of medications at home  ??? Discussed possible weaning of medication dosing dependent on treatment/procedure results.     Will continue to monitor any benefits vs side effects of the medications as prescribed.  The patient has been warned about the risk of operating machinery including driving if impaired in any way by these medications.  The patient also accepts the risks of tolerance, dependency, or addiction related to the prescribed medications.  All questions were answered.  Reevaluation as planned, or sooner if requested.     Controlled Substances Monitoring: Periodic Controlled Substance Monitoring: Possible medication side effects, risk of tolerance/dependence & alternative treatments discussed., No signs of potential drug abuse or diversion identified., Random urine drug screen sent today. Marland KitchenRocky Morel, APRN - CNP)    Return in about 3 months (around 06/08/2018).     It was my pleasure to evaluate Theresa Vargas today.  Please call with any concerns or questions.  16 minutes spent in evaluation efforts    Rocky Morel, APRN - CNP

## 2018-03-13 ENCOUNTER — Encounter: Attending: Nurse Practitioner

## 2018-03-15 ENCOUNTER — Ambulatory Visit: Admit: 2018-03-15 | Discharge: 2018-03-15 | Payer: PRIVATE HEALTH INSURANCE | Attending: Family

## 2018-03-15 DIAGNOSIS — Z9622 Myringotomy tube(s) status: Secondary | ICD-10-CM

## 2018-03-15 NOTE — Progress Notes (Signed)
[facility]  [facility]  [address]  [address]  [place] [address]  Dept: [phone]  Dept Fax: [phone]  Loc: [phone]    Theresa Vargas is a 63 y.o. female who was referred by No ref. provider found for:  Chief Complaint   Patient presents with   ??? Follow-up     Patient is here for impacted wax in ear and check tube in left ear.   Marland Kitchen    HPI:       Patient here for 6 month tube check. She had left PET placed 07/19/16 by Dr. Andrey Farmer for chronic left middle ear effusion.  She denies any complaints today.     Past Medical History:   Diagnosis Date   ??? Depression    ??? Diabetes mellitus (HCC)    ??? Hyperlipidemia    ??? Hypertension    ??? Nausea & vomiting    ??? Panic disorder    ??? PONV (postoperative nausea and vomiting)    ??? Psychiatric problem    ??? Sleep apnea      Past Surgical History:   Procedure Laterality Date   ??? BACK SURGERY  11/28/2015    [place]   ??? CHOLECYSTECTOMY  2007   ??? COLONOSCOPY      2006   ??? HEMORRHOID SURGERY  05/2010   ??? HYSTERECTOMY  2007   ??? NECK SURGERY  2008 [facility]    ACDF   ??? NERVE BLOCK  10/29/2014    LUMBAR FACET INJECTION   ??? OTHER SURGICAL HISTORY  11/05/10    hemmroidectomy   ??? OTHER SURGICAL HISTORY Bilateral 01/06/2015    Lumbar Facet   ??? PR CREATE EARDRUM OPENING,GEN ANESTH Left 07/19/2016    MYRINGOTOMY TYPANOSTOMY TUBE PLACEMENT, LEFT performed by Geoffery Lyons, [facility] at [facility] OR   ??? SKIN BIOPSY  2007    left cheek   ??? TYMPANOSTOMY TUBE PLACEMENT  2006,2011     Family History   Problem Relation Age of Onset   ??? Diabetes Other    ??? Hypertension Other    ??? Mental Illness Other    ??? Heart Disease Other    ??? Other Other         bone and joint problems   ??? COPD Mother    ??? Hypertension Mother    ??? Diabetes Mother    ??? High Cholesterol Mother    ??? Depression Mother    ??? Alcohol Abuse Mother    ??? Hypertension Father    ??? Diabetes Father    ??? High Cholesterol Father    ??? Heart Attack Father          Subjective:         Review of Systems   Constitutional: Negative for activity change, appetite change, chills, diaphoresis, fatigue, fever and unexpected weight change.   HENT: Negative for congestion, dental problem, ear discharge, ear pain, facial swelling, hearing loss, mouth sores, nosebleeds, postnasal drip, rhinorrhea, sinus pressure, sneezing, sore throat, tinnitus, trouble swallowing and voice change.    Eyes: Negative for visual disturbance.   Respiratory: Negative for apnea, cough, choking, chest tightness, shortness of breath, wheezing and stridor.    Cardiovascular: Negative for chest pain, palpitations and leg swelling.   Gastrointestinal: Negative for abdominal pain, diarrhea, nausea and vomiting.   Endocrine: Negative for cold intolerance, heat intolerance, polydipsia and polyuria.   Genitourinary:  Negative for difficulty urinating, dysuria, enuresis, hematuria and urgency.   Musculoskeletal: Negative for arthralgias, gait problem, neck pain and neck stiffness.   Skin: Negative for color change and rash.   Allergic/Immunologic: Negative for environmental allergies and immunocompromised state.   Neurological: Negative for dizziness, syncope, facial asymmetry, speech difficulty, light-headedness and headaches.   Hematological: Negative for adenopathy. Does not bruise/bleed easily.   Psychiatric/Behavioral: Negative for confusion and sleep disturbance. The patient is not nervous/anxious.        Objective:       Physical Exam     This is a 63 y.o. female. Patient is alert and oriented to person, place and time. Mood is happy. No respiratory distress. No nasal voice, no hoarseness. Not obviously hearing impaired.    Vitals:    03/15/18 1418   BP: 122/78   Pulse: 68   Resp: 14       External ears are normal: no scars, lesions or masses.   R External auditory canal clear and free of any pathology  L External auditory canal clear and free of any pathology   Tympanic membranes:  R intact, translucent                                             L tube in place-dry, patent    Data:  All of the past medical history, past surgical history, family history, social history, allergies and current medications were reviewed.       Assessment & Plan:        1. Status post myringotomy with insertion of tube, left    - Discussed water precautions.  - Call for any ear drainage.  - Follow up every 6 months while tubes in place.      Return in about 6 months (around 09/13/2018) for tube check.

## 2018-03-22 ENCOUNTER — Encounter: Attending: Family

## 2018-04-26 ENCOUNTER — Emergency Department: Admit: 2018-04-27 | Payer: PRIVATE HEALTH INSURANCE

## 2018-04-26 DIAGNOSIS — R109 Unspecified abdominal pain: Secondary | ICD-10-CM

## 2018-04-26 NOTE — ED Notes (Addendum)
Pt presents to ED for c/o abdominal pain, diarrhea, fatigue and generalized body aches x3 days. Pt states she has never had pain like this before. Pt states she has her appendix, no gallbladder. Pt states her and her family traveled to [place] in February. Pt denies any chest pain, emesis, or nausea. Pt states having slight shortness of breath, however states "I think it's just from the pain." EKG completed upon arrival. Pt resting in position of comfort on cot. Pt is A&Ox4, resps easy and unlabored. Pt denies abdominal tenderness with palpation, denies any urinary symptoms. Flu swab obtained and sent to lab. IV attempt x2 unsuccessful. Awaiting provider assessment. Monitor applied and VSS.     Deatra Canter, RN  04/26/18 2225

## 2018-04-26 NOTE — ED Provider Notes (Signed)
[facility]  EMERGENCY DEPARTMENT ENCOUNTER          CHIEF COMPLAINT       Chief Complaint   Patient presents with   . Abdominal Pain   . Generalized Body Aches       Nurses Notes reviewed and I agree except as noted in the HPI.      HISTORY OF PRESENT ILLNESS    Theresa Vargas is a 63 y.o. female.    This patient states that she has been having symptoms since Sunday so basically now 4 days of generalized abdominal pain that goes from the lower abdomen up into almost into the chest area and it is bilateral.  She has had some diarrhea.  She has had some generalized aches and pains.  She had a history of a previous cholecystectomy and a hysterectomy.  Appendix is still there.    REVIEW OF SYSTEMS         No fever, no coughing, no chest pain or shortness of breath.    Remainder of review of systems is otherwise reviewed as negative.      PAST MEDICAL HISTORY    has a past medical history of Depression, Diabetes mellitus (HCC), Hyperlipidemia, Hypertension, Nausea & vomiting, Panic disorder, PONV (postoperative nausea and vomiting), Psychiatric problem, and Sleep apnea.    SURGICAL HISTORY      has a past surgical history that includes Cholecystectomy (2007); Hysterectomy (2007); Neck surgery (2008 [facility]); Tympanostomy tube placement (1610,9604); skin biopsy (2007); Colonoscopy; Hemorrhoid surgery (05/2010); other surgical history (11/05/10); Nerve Block (10/29/2014); other surgical history (Bilateral, 01/06/2015); back surgery (11/28/2015); and pr create eardrum opening,gen anesth (Left, 07/19/2016).    CURRENT MEDICATIONS       Previous Medications    COENZYME Q10 (CO Q-10) 100 MG CAPS    Take by mouth daily    CPAP MACHINE MISC    by Does not apply route Please check patient's PAP machine for proper functioning by [employer].    DILTIAZEM (TIAZAC) 300 MG EXTENDED RELEASE CAPSULE    TAKE 1 CAPSULE BY MOUTH ONE TIME A DAY    DULOXETINE (CYMBALTA) 30 MG EXTENDED RELEASE CAPSULE    Take 30 mg by  mouth daily    DULOXETINE (CYMBALTA) 60 MG EXTENDED RELEASE CAPSULE    Take 60 mg by mouth daily    HYOSCYAMINE (LEVSIN/SL) 125 MCG SUBLINGUAL TABLET    DISSOLVE 1 TABLET UNDER TONGUE EVERY FOUR HOURS AS NEEDED    IBUPROFEN (ADVIL;MOTRIN) 200 MG TABLET    Take 200 mg by mouth every 6 hours as needed for Pain    JANUVIA 100 MG TABLET    Take 1 capsule by mouth daily    LORAZEPAM (ATIVAN) 1 MG TABLET    Take 1 mg by mouth every 6 hours as needed.      LOSARTAN-HYDROCHLOROTHIAZIDE (HYZAAR) 100-25 MG PER TABLET    TAKE 1 TABLET BY MOUTH ONE TIME A DAY    METFORMIN (GLUCOPHAGE) 500 MG TABLET    Take 500 mg by mouth 2 times daily (with meals) 1 tablet in am and 2 tablets at bedtime    NALOXONE (NARCAN) 4 MG/0.1ML LIQD NASAL SPRAY    1 spray by Nasal route as needed for Opioid Reversal    ONE TOUCH ULTRA TEST STRIP    CHECK BLOOD SUGAR ONCE DAILY.    PRAVASTATIN (PRAVACHOL) 20 MG TABLET    Take 1 tablet by mouth daily    ZOLPIDEM (  AMBIEN) 10 MG TABLET    Take 10 mg by mouth nightly Takes every night       ALLERGIES     has No Known Allergies.    FAMILY HISTORY     She indicated that her mother is deceased. She indicated that her father is deceased. She indicated that the status of her other is unknown.   family history includes Alcohol Abuse in her mother; COPD in her mother; Depression in her mother; Diabetes in her father, mother, and another family member; Heart Attack in her father; Heart Disease in an other family member; High Cholesterol in her father and mother; Hypertension in her father, mother, and another family member; Mental Illness in an other family member; Other in an other family member.    SOCIAL HISTORY      reports that she has never smoked. She has never used smokeless tobacco. She reports that she does not drink alcohol or use drugs.    PHYSICAL EXAM     INITIAL VITALS:  height is 5' 6.5" (1.689 m) and weight is 200 lb (90.7 kg). Her oral temperature is 98 F (36.7 C). Her blood pressure is 155/67  (abnormal) and her pulse is 83. Her respiration is 18 and oxygen saturation is 95%.      Constitutional: Well appearing and non-toxic   Eyes:  Pupils are equal and reactive, extraocular muscles intact   HENT:  Atraumatic appearing  oropharynx moist, no pharyngeal exudates.  Neck- normal range of motion, supple   Respiratory:  No wheezing, rhonchi or rales  Cardiovascular: regular, no murmur  GI:  Nonspecific tenderness, no rigidity, rebound or guarding  GU:  No costovertebral angle tenderness   Musculoskeletal:  2/4 distal pulses, no pitting edema  Integument: warm and dry  Neurologic:  Alert & oriented x 3  Psychiatric:  Speech and behavior appropriate       DIAGNOSTIC RESULTS     EKG: All EKG's are interpreted by the Emergency Department Physician who either signs or Co-signs this chart in the absence of a cardiologist.  EKG interpreted by me shows sinus rhythm at a rate of 89, QRS of 74, QTc of 452, axis of 11.  No acute ischemic changes.    RADIOLOGY: non-plain film images(s) such as CT, Ultrasound and MRI are read by the radiologist.  CT scan of the abdomen pelvis was interpreted by the radiologist    LABS:   Labs Reviewed   CBC WITH AUTO DIFFERENTIAL - Abnormal; Notable for the following components:       Result Value    Platelets 430 (*)     MPV 8.9 (*)     Segs Absolute 7.8 (*)     All other components within normal limits   COMPREHENSIVE METABOLIC PANEL W/ REFLEX TO MG FOR LOW K - Abnormal; Notable for the following components:    Glucose 289 (*)     Sodium 132 (*)     Chloride 91 (*)     CO2 22 (*)     AST 144 (*)     Alkaline Phosphatase 161 (*)     ALT 236 (*)     All other components within normal limits   ANION GAP - Abnormal; Notable for the following components:    Anion Gap 19.0 (*)     All other components within normal limits   GLOMERULAR FILTRATION RATE, ESTIMATED - Abnormal; Notable for the following components:    Est, Glom Filt Rate 85 (*)  All other components within normal limits   LIPASE    MAGNESIUM   OSMOLALITY       EMERGENCY DEPARTMENT COURSE:   Vitals:    Vitals:    04/26/18 0027 04/26/18 2143 04/26/18 2252 04/27/18 0012   BP: (!) 150/67 (!) 160/92 (!) 159/91 (!) 155/67   Pulse: 82 99 87 83   Resp: 18  18 18    Temp:  98 F (36.7 C)     TempSrc:  Oral     SpO2: 95%  97% 95%   Weight:  200 lb (90.7 kg)     Height:  5' 6.5" (1.689 m)       Laboratory results reviewed.  There is no fever and she has a very benign abdominal exam.  She has a nonspecific inflammation throughout her colon in conjunction with diarrhea.  Does not appear to have any acute surgical process.  She will be discharged is advised to have follow-up in 2 to 3 days.      CRITICAL CARE:   none      FINAL IMPRESSION      1. Abdominal pain of unknown etiology          DISPOSITION/PLAN   Discharged    DISCHARGE MEDICATIONS:  New Prescriptions    DICYCLOMINE (BENTYL) 10 MG CAPSULE    Take 1 capsule by mouth every 6 hours as needed (cramps)    ONDANSETRON (ZOFRAN ODT) 4 MG DISINTEGRATING TABLET    Take 1 tablet by mouth every 8 hours as needed for Nausea       (Please note that portions of this note were completed with a voice recognition program.  Efforts were made to edit the dictations but occasionally words are mis-transcribed.)    Max Sane, DO           Max Sane, DO  04/27/18 0981

## 2018-04-26 NOTE — ED Notes (Signed)
Upon first contact with patient this RN receives bedside shift report from [employer].   Patient resting.  States pain has decreased to 5/10.  States 'Feels anxious'   Requesting routine home medication of ativan     Raye Sorrow, RN  04/26/18 [phone]

## 2018-04-26 NOTE — ED Notes (Signed)
In for hourly rounding and to medicate pt per [facility]. Pt resting in position of comfort on cot. Pt remains A&ox 4, resps easy and unlabored. Medicated pt per MAR. Will continue to monitor.     Deatra Canter, RN  04/26/18 [phone]

## 2018-04-26 NOTE — ED Notes (Signed)
ED nurse-to-nurse bedside report    Chief Complaint   Patient presents with   ??? Abdominal Pain   ??? Generalized Body Aches      LOC: alert and orientated to name, place, date  Vital signs   Vitals:    04/26/18 2143 04/26/18 2252   BP: (!) 160/92 (!) 159/91   Pulse: 99 87   Resp:  18   Temp: 98 ??F (36.7 ??C)    TempSrc: Oral    SpO2:  97%   Weight: 200 lb (90.7 kg)    Height: 5' 6.5" (1.689 m)       Pain:    Pain Interventions: Toradol, Bentyl  Pain Goal: 2  Oxygen: No    Current needs required N/A   Telemetry: Yes  LDAs:   Peripheral IV 04/26/18 Right Forearm (Active)   Site Assessment Clean;Dry;Intact 04/26/2018 11:03 PM   Line Status Flushed;Infusing 04/26/2018 11:03 PM   Dressing Status Clean;Dry;Intact 04/26/2018 11:03 PM   Dressing Intervention New 04/26/2018 11:03 PM     Continuous Infusions:   Mobility: Independent  Morse Fall Risk Score: No flowsheet data found.  Fall Interventions: N/A  Report given to: Thurston Hole, RN     Deatra Canter, RN  04/26/18 [phone]

## 2018-04-27 ENCOUNTER — Inpatient Hospital Stay
Admit: 2018-04-27 | Discharge: 2018-04-27 | Disposition: A | Discharge status: Home / Self Care | Payer: PRIVATE HEALTH INSURANCE | Attending: Emergency Medicine

## 2018-04-27 LAB — EKG 12-LEAD
Atrial Rate: 89 {beats}/min
P Axis: 52 degrees
P-R Interval: 160 ms
Q-T Interval: 372 ms
QRS Duration: 74 ms
QTc Calculation (Bazett): 452 ms
R Axis: 11 degrees
T Axis: 40 degrees
Ventricular Rate: 89 {beats}/min

## 2018-04-27 LAB — COMPREHENSIVE METABOLIC PANEL W/ REFLEX TO MG FOR LOW K
ALT: 236 U/L — ABNORMAL HIGH (ref 11–66)
AST: 144 U/L — ABNORMAL HIGH (ref 5–40)
Albumin: 4.8 g/dL (ref 3.5–5.1)
Alkaline Phosphatase: 161 U/L — ABNORMAL HIGH (ref 38–126)
BUN: 14 mg/dL (ref 7–22)
CO2: 22 meq/L — ABNORMAL LOW (ref 23–33)
Calcium: 9.7 mg/dL (ref 8.5–10.5)
Chloride: 91 meq/L — ABNORMAL LOW (ref 98–111)
Creatinine: 0.7 mg/dL (ref 0.4–1.2)
Glucose: 289 mg/dL — ABNORMAL HIGH (ref 70–108)
Potassium reflex Magnesium: 3.5 meq/L (ref 3.5–5.2)
Sodium: 132 meq/L — ABNORMAL LOW (ref 135–145)
Total Bilirubin: 0.9 mg/dL (ref 0.3–1.2)
Total Protein: 8 g/dL (ref 6.1–8.0)

## 2018-04-27 LAB — OSMOLALITY: Osmolality Calc: 275.6 mOsmol/kg (ref >=275.0)

## 2018-04-27 LAB — CBC WITH AUTO DIFFERENTIAL
Basophils Absolute: 0 10*3/uL (ref 0.0–0.1)
Basophils: 0.2 %
Eosinophils Absolute: 0 10*3/uL (ref 0.0–0.4)
Eosinophils: 0.4 %
Hematocrit: 44 % (ref 37.0–47.0)
Hemoglobin: 14.6 gm/dl (ref 12.0–16.0)
Immature Grans (Abs): 0.03 10*3/uL (ref 0.00–0.07)
Immature Granulocytes: 0.3 %
Lymphocytes Absolute: 2 10*3/uL (ref 1.0–4.8)
Lymphocytes: 19.1 %
MCH: 29 pg (ref 26.0–33.0)
MCHC: 33.2 gm/dl (ref 32.2–35.5)
MCV: 87.5 fL (ref 81.0–99.0)
MPV: 8.9 fL — ABNORMAL LOW (ref 9.4–12.4)
Monocytes Absolute: 0.6 10*3/uL (ref 0.4–1.3)
Monocytes: 6 %
Platelets: 430 10*3/uL — ABNORMAL HIGH (ref 130–400)
RBC: 5.03 10*6/uL (ref 4.20–5.40)
RDW-CV: 12.7 % (ref 11.5–14.5)
RDW-SD: 40.1 fL (ref 35.0–45.0)
Seg Neutrophils: 74 %
Segs Absolute: 7.8 10*3/uL — ABNORMAL HIGH (ref 1.8–7.7)
WBC: 10.5 10*3/uL (ref 4.8–10.8)
nRBC: 0 /100 wbc

## 2018-04-27 LAB — LIPASE: Lipase: 23.7 U/L (ref 5.6–51.3)

## 2018-04-27 LAB — MAGNESIUM: Magnesium: 1.9 mg/dL (ref 1.6–2.4)

## 2018-04-27 LAB — GLOMERULAR FILTRATION RATE, ESTIMATED: Est, Glom Filt Rate: 85 mL/min/{1.73_m2} — AB

## 2018-04-27 LAB — ANION GAP: Anion Gap: 19 meq/L — ABNORMAL HIGH (ref 8.0–16.0)

## 2018-04-27 MED ORDER — DICYCLOMINE HCL 10 MG PO CAPS
10 MG | ORAL_CAPSULE | Freq: Four times a day (QID) | ORAL | 0 refills | Status: DC | PRN
Start: 2018-04-27 — End: 2018-05-09

## 2018-04-27 MED ORDER — ONDANSETRON 4 MG PO TBDP
4 MG | ORAL_TABLET | Freq: Three times a day (TID) | ORAL | 0 refills | Status: DC | PRN
Start: 2018-04-27 — End: 2018-05-09

## 2018-04-27 MED ORDER — IOPAMIDOL 76 % IV SOLN
76 % | Freq: Once | INTRAVENOUS | Status: AC | PRN
Start: 2018-04-27 — End: 2018-04-26
  Administered 2018-04-27: 04:00:00 80 mL via INTRAVENOUS

## 2018-04-27 MED ORDER — LORAZEPAM 1 MG PO TABS
1 MG | ORAL | Status: DC
Start: 2018-04-27 — End: 2018-04-27

## 2018-04-27 MED ORDER — SODIUM CHLORIDE 0.9 % IV BOLUS
0.9 % | Freq: Once | INTRAVENOUS | Status: AC
Start: 2018-04-27 — End: 2018-04-27
  Administered 2018-04-27: 03:00:00 1000 mL via INTRAVENOUS

## 2018-04-27 MED ORDER — DICYCLOMINE HCL 10 MG PO CAPS
10 MG | Freq: Once | ORAL | Status: AC
Start: 2018-04-27 — End: 2018-04-26
  Administered 2018-04-27: 03:00:00 20 mg via ORAL

## 2018-04-27 MED ORDER — KETOROLAC TROMETHAMINE 30 MG/ML IJ SOLN
30 MG/ML | Freq: Once | INTRAMUSCULAR | Status: AC
Start: 2018-04-27 — End: 2018-04-26
  Administered 2018-04-27: 03:00:00 30 mg via INTRAVENOUS

## 2018-04-27 MED ORDER — ONDANSETRON HCL 4 MG/2ML IJ SOLN
4 MG/2ML | Freq: Once | INTRAMUSCULAR | Status: AC
Start: 2018-04-27 — End: 2018-04-26
  Administered 2018-04-27: 03:00:00 4 mg via INTRAVENOUS

## 2018-04-27 MED ORDER — LORAZEPAM 1 MG PO TABS
1 MG | Freq: Once | ORAL | Status: AC
Start: 2018-04-27 — End: 2018-04-27
  Administered 2018-04-27: 04:00:00 1 mg via ORAL

## 2018-04-27 MED FILL — ONDANSETRON HCL 4 MG/2ML IJ SOLN: 4 MG/2ML | INTRAMUSCULAR | Qty: 2

## 2018-04-27 MED FILL — LORAZEPAM 1 MG PO TABS: 1 mg | ORAL | Qty: 1

## 2018-04-27 MED FILL — DICYCLOMINE HCL 10 MG PO CAPS: 10 mg | ORAL | Qty: 2

## 2018-04-27 MED FILL — KETOROLAC TROMETHAMINE 30 MG/ML IJ SOLN: 30 mg/mL | INTRAMUSCULAR | Qty: 1

## 2018-04-27 NOTE — Telephone Encounter (Signed)
From: Kennedy Bucker  To: Rocky Morel, APRN - CNP  Sent: 04/27/2018 4:32 PM EDT  Subject: Prescription Question    brittany  last nighti have abd pain. been using my norco. i went to the Er and they only give me toradol once and sent me home bentyl which doesnt works. i have pain not cramps. so i called my dr bowlus and he is out of the office til monday  can you give me something for my abd pain cause low on my norco. its the only working or i wouldn't take.   thanks sandy [place].

## 2018-04-27 NOTE — Discharge Instructions (Signed)
Follow-up needed in 2 to 3 days.  Return to ER for change in severity, nature, location of pain, or any new symptoms or concerns.

## 2018-04-28 NOTE — Telephone Encounter (Signed)
I cannot give you any pain medication for treatment of abdominal pain, that is not what I see you for. Would have to get ahold of PCP or go back to ER or urgent care if pain does not improve.

## 2018-05-01 ENCOUNTER — Inpatient Hospital Stay: Payer: PRIVATE HEALTH INSURANCE

## 2018-05-01 DIAGNOSIS — R110 Nausea: Secondary | ICD-10-CM

## 2018-05-02 LAB — AST: AST: 18 U/L (ref 5–40)

## 2018-05-02 LAB — ALT: ALT: 49 U/L (ref 11–66)

## 2018-05-03 ENCOUNTER — Inpatient Hospital Stay: Payer: PRIVATE HEALTH INSURANCE

## 2018-05-03 DIAGNOSIS — R197 Diarrhea, unspecified: Secondary | ICD-10-CM

## 2018-05-04 LAB — COMPREHENSIVE METABOLIC PANEL
ALT: 31 U/L (ref 11–66)
AST: 17 U/L (ref 5–40)
Albumin: 4.5 g/dL (ref 3.5–5.1)
Alkaline Phosphatase: 132 U/L — ABNORMAL HIGH (ref 38–126)
BUN: 12 mg/dL (ref 7–22)
CO2: 27 meq/L (ref 23–33)
Calcium: 9.7 mg/dL (ref 8.5–10.5)
Chloride: 98 meq/L (ref 98–111)
Creatinine: 0.7 mg/dL (ref 0.4–1.2)
Glucose: 226 mg/dL — ABNORMAL HIGH (ref 70–108)
Potassium: 4.3 meq/L (ref 3.5–5.2)
Sodium: 140 meq/L (ref 135–145)
Total Bilirubin: 0.2 mg/dL — ABNORMAL LOW (ref 0.3–1.2)
Total Protein: 7.3 g/dL (ref 6.1–8.0)

## 2018-05-04 LAB — CBC WITH AUTO DIFFERENTIAL
Basophils Absolute: 0 10*3/uL (ref 0.0–0.1)
Basophils: 0.3 %
Eosinophils Absolute: 0.1 10*3/uL (ref 0.0–0.4)
Eosinophils: 0.8 %
Hematocrit: 41.2 % (ref 37.0–47.0)
Hemoglobin: 13.1 gm/dl (ref 12.0–16.0)
Immature Grans (Abs): 0.02 10*3/uL (ref 0.00–0.07)
Immature Granulocytes: 0.3 %
Lymphocytes Absolute: 2.5 10*3/uL (ref 1.0–4.8)
Lymphocytes: 33 %
MCH: 29.3 pg (ref 26.0–33.0)
MCHC: 31.8 gm/dl — ABNORMAL LOW (ref 32.2–35.5)
MCV: 92.2 fL (ref 81.0–99.0)
MPV: 9.5 fL (ref 9.4–12.4)
Monocytes Absolute: 0.4 10*3/uL (ref 0.4–1.3)
Monocytes: 5.8 %
Platelets: 382 10*3/uL (ref 130–400)
RBC: 4.47 10*6/uL (ref 4.20–5.40)
RDW-CV: 12.4 % (ref 11.5–14.5)
RDW-SD: 41.7 fL (ref 35.0–45.0)
Seg Neutrophils: 59.8 %
Segs Absolute: 4.5 10*3/uL (ref 1.8–7.7)
WBC: 7.5 10*3/uL (ref 4.8–10.8)
nRBC: 0 /100 wbc

## 2018-05-04 LAB — GASTROINTESTINAL PANEL, MOLECULAR
Adenovirus F 40 41 PCR: NOT DETECTED
Astrovirus PCR: NOT DETECTED
Campylobacter PCR: NOT DETECTED
Clostridium difficile, PCR: NOT DETECTED
Cryptosporidium PCR: NOT DETECTED
Cyclospora Cayetanensis PCR: NOT DETECTED
E Coli Enteroaggregative PCR: NOT DETECTED
E Coli Enteropathogenic PCR: NOT DETECTED
E Coli Enterotoxigenic PCR: NOT DETECTED
E Coli Shiga Like Toxin PCR: NOT DETECTED
E Coli Shigella/Enteroinvasive PCR: NOT DETECTED
E HISTOLYTICA GI FILM ARRAY: NOT DETECTED
Giardia Lamblia PCR: NOT DETECTED
Norovirus GI GII PCR: NOT DETECTED
Plesiomonas Shigelloides PCR: NOT DETECTED
Rotavirus A PCR: NOT DETECTED
Salmonella PCR: NOT DETECTED
Sapovirus PCR: NOT DETECTED
Vibrio Cholerae PCR: NOT DETECTED
Vibrio PCR: NOT DETECTED
Yersinia Enterocolitica PCR: NOT DETECTED

## 2018-05-04 LAB — GLOMERULAR FILTRATION RATE, ESTIMATED: Est, Glom Filt Rate: 85 mL/min/{1.73_m2} — AB

## 2018-05-04 LAB — ANION GAP: Anion Gap: 15 meq/L (ref 8.0–16.0)

## 2018-05-04 LAB — C-REACTIVE PROTEIN: CRP: 0.26 mg/dl (ref 0.00–1.00)

## 2018-05-05 LAB — H. PYLORI ANTIGEN, STOOL

## 2018-05-05 NOTE — Telephone Encounter (Signed)
LM asking pt to call office to schedule an appointment to see Dr. Neoma Laming to schedule an appointment

## 2018-05-08 ENCOUNTER — Inpatient Hospital Stay: Payer: PRIVATE HEALTH INSURANCE

## 2018-05-08 DIAGNOSIS — R1031 Right lower quadrant pain: Secondary | ICD-10-CM

## 2018-05-08 LAB — SEDIMENTATION RATE: Sed Rate: 3 mm/hr (ref 0–20)

## 2018-05-08 LAB — COMPREHENSIVE METABOLIC PANEL
ALT: 30 U/L (ref 11–66)
AST: 29 U/L (ref 5–40)
Albumin: 4.3 g/dL (ref 3.5–5.1)
Alkaline Phosphatase: 125 U/L (ref 38–126)
BUN: 8 mg/dL (ref 7–22)
CO2: 24 meq/L (ref 23–33)
Calcium: 9.6 mg/dL (ref 8.5–10.5)
Chloride: 97 meq/L — ABNORMAL LOW (ref 98–111)
Creatinine: 0.6 mg/dL (ref 0.4–1.2)
Glucose: 274 mg/dL — ABNORMAL HIGH (ref 70–108)
Potassium: 3.8 meq/L (ref 3.5–5.2)
Sodium: 138 meq/L (ref 135–145)
Total Bilirubin: 0.3 mg/dL (ref 0.3–1.2)
Total Protein: 7.1 g/dL (ref 6.1–8.0)

## 2018-05-08 LAB — CBC
Hematocrit: 42 % (ref 37.0–47.0)
Hemoglobin: 13.7 gm/dl (ref 12.0–16.0)
MCH: 29.1 pg (ref 26.0–33.0)
MCHC: 32.6 gm/dl (ref 32.2–35.5)
MCV: 89.2 fL (ref 81.0–99.0)
MPV: 9.5 fL (ref 9.4–12.4)
Platelets: 412 10*3/uL — ABNORMAL HIGH (ref 130–400)
RBC: 4.71 10*6/uL (ref 4.20–5.40)
RDW-CV: 12.1 % (ref 11.5–14.5)
RDW-SD: 39.4 fL (ref 35.0–45.0)
WBC: 6.7 10*3/uL (ref 4.8–10.8)

## 2018-05-08 LAB — C-REACTIVE PROTEIN: CRP: 0.33 mg/dl (ref 0.00–1.00)

## 2018-05-08 LAB — AMYLASE: Amylase: 31 U/L (ref 20–104)

## 2018-05-08 LAB — LIPASE: Lipase: 26.9 U/L (ref 5.6–51.3)

## 2018-05-08 LAB — ANION GAP: Anion Gap: 17 meq/L — ABNORMAL HIGH (ref 8.0–16.0)

## 2018-05-08 LAB — GLOMERULAR FILTRATION RATE, ESTIMATED: Est, Glom Filt Rate: >90 mL/min/{1.73_m2}

## 2018-05-09 ENCOUNTER — Ambulatory Visit: Admit: 2018-05-09 | Discharge: 2018-05-09 | Payer: PRIVATE HEALTH INSURANCE | Attending: Surgery

## 2018-05-09 DIAGNOSIS — K5000 Crohn's disease of small intestine without complications: Secondary | ICD-10-CM

## 2018-05-09 NOTE — Progress Notes (Signed)
Theresa Chance,  MD   General Surgery  New Patient Evaluation in Office  Pt Name: Theresa Vargas  Date of Birth 1955-05-16   Today's Date: 05/09/2018  Medical Record Number: [id number]  Referring Provider: Alena Bills, MD  Primary Care Provider: Beverly Gust. Bowlus, MD  Chief Complaint:  Chief Complaint   Patient presents with   . Surgical Consult     New Patient- Ref Sr. Bowlus- RUQ pain, Epigastric pain-- CT abd Pel 3/25       ASSESSMENT      1. 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 Diarrhea, unspecified type    3. Helicobacter pylori stool test positive    4.  Non-insulin-dependent diabetes mellitus     PLANS      1.  Treatment of H. pylori stool antigen per GI and Dr. Seleta Rhymes  2.  No clinical evidence of chronic or acute appendicitis.  3.  Recommend treatment as per GI evaluation for H. pylori and persistent diarrhea with CT findings of terminal ileitis.  If symptoms worsen she may feel free to call my office or present to the emergency department.  Follow-up in 1 month after further treatment.  4.  Patient examined all data and imaging reviewed by myself.        SUBJECTIVE     [name] is a 63 y.o. year old female who is presenting today in the office for evaluation of abdominal pain and diarrhea.  I have seen Sandy remotely 8 years ago she underwent hemorrhoidectomy and prior to that a cholecystectomy.  She been having no gastrointestinal issues until about 3 weeks ago she developed epigastric right-sided abdominal pain as well as watery nonbloody diarrhea.  She had recently been to [employer] and returned at the end of February early March.  She denies any fevers.  Her appetite is decreased.  She denies any change in weight.  She was seen and evaluated in the emergency department.  Stool for GI pathogens was negative at that time.  The appendix was not seen.  She does not believe she has had a prior appendectomy.  She has had a hysterectomy.  She had evidence of inflammation in the terminal  ileum but no free fluid on CT imaging.  There was no pericecal inflammation.  She had repeat lab studies yesterday which showed normal C-reactive protein and sedimentation rate as well as a normal white count.  She is not anemic.  She did have a stool for Helicobacter antigen on April 1 which was positive.  She has not received treatment for Helicobacter as yet.  She [facility].  She continues to have loose diarrheal nonbloody stools and complains of abdominal pain though improved.  On examination today her pain is mostly epigastric.  She denies prior history of ulcer disease.   She is not taking any antacid medications.    Past Medical History  Past Medical History:   Diagnosis Date   . Depression    . Diabetes mellitus (HCC)     type 2-on Lantus   . H. pylori infection 05/03/2018    stool   . Hyperlipidemia    . Hypertension    . Nausea & vomiting    . Panic disorder    . PONV (postoperative nausea and vomiting)    . Psychiatric problem    . Sleep apnea     on cpap       Past Surgical  History  Past Surgical History:   Procedure Laterality Date   . BACK SURGERY  [place]   . CARDIOVASCULAR STRESS TEST  2018   . CARDIOVASCULAR STRESS TEST  2016   . CHOLECYSTECTOMY  2007    Dr Neoma Laming   . COLONOSCOPY      2006-Dr Neidich   . HEMORRHOID SURGERY  02/2011    Dr Mackey Birchwood skin tag removal   . HYSTERECTOMY  2007    Dr Marlou Porch   . NECK SURGERY  2008 [facility]    [facility]   . NERVE BLOCK  10/29/2014    LUMBAR FACET INJECTION   . OTHER SURGICAL HISTORY  11/05/2010    hemmoridectomy-Dr Kandie Keiper   . OTHER SURGICAL HISTORY Bilateral 01/06/2015    Lumbar Facet   . OTHER SURGICAL HISTORY  09/2010    hemorrhoid banding-Dr Neidich   . OTHER SURGICAL HISTORY  09/2010    hemorrhoid banding-Dr Neidich   . PR CREATE EARDRUM OPENING,GEN ANESTH Left 07/19/2016    MYRINGOTOMY TYPANOSTOMY TUBE PLACEMENT, LEFT performed by Geoffery Lyons, MD at [facility] OR   . SKIN BIOPSY  2007    left upper arm-benign  Dr Rosie Fate   . TYMPANOSTOMY TUBE PLACEMENT  2006,2011,2016       Medications  Current Outpatient Medications   Medication Sig Dispense Refill   . Insulin Glargine (LANTUS SC) Inject into the skin 10 units daily increase by 1 unit daily until BS reach 150 goal     . ibuprofen (ADVIL;MOTRIN) 200 MG tablet Take 200 mg by mouth every 6 hours as needed for Pain     . diltiazem (TIAZAC) 300 MG extended release capsule TAKE 1 CAPSULE BY MOUTH ONE TIME A DAY  3   . hyoscyamine (LEVSIN/SL) 125 MCG sublingual tablet DISSOLVE 1 TABLET UNDER TONGUE EVERY FOUR HOURS AS NEEDED  5   . CPAP Machine MISC by Does not apply route Please check patient's PAP machine for proper functioning by [employer]. 1 each 0   . DULoxetine (CYMBALTA) 60 MG extended release capsule Take 60 mg by mouth daily     . DULoxetine (CYMBALTA) 30 MG extended release capsule Take 30 mg by mouth daily     . losartan-hydrochlorothiazide (HYZAAR) 100-25 MG per tablet TAKE 1 TABLET BY MOUTH ONE TIME A DAY  11   . ONE TOUCH ULTRA TEST strip CHECK BLOOD SUGAR ONCE DAILY.  3   . zolpidem (AMBIEN) 10 MG tablet Take 10 mg by mouth nightly Takes every night     . lorazepam (ATIVAN) 1 MG tablet Take 1 mg by mouth every 6 hours as needed.         No current facility-administered medications for this visit.      Allergies   No Known Allergies    Family History  Family History   Problem Relation Age of Onset   . Diabetes Other    . Hypertension Other    . Mental Illness Other    . Heart Disease Other    . Other Other         bone and joint problems   . COPD Mother    . Hypertension Mother    . Diabetes Mother    . High Cholesterol Mother    . Depression Mother    . Alcohol Abuse Mother    . Hypertension Father    . Diabetes Father    . High Cholesterol Father    .  Heart Attack Father        SocialHistory  Social History     Socioeconomic History   . Marital status: Married     Spouse name: Not on file   . Number of children: 2   . Years of education: Not on file   . Highest  education level: Not on file   Occupational History   . Not on file   Social Needs   . Financial resource strain: Not on file   . Food insecurity     Worry: Not on file     Inability: Not on file   . Transportation needs     Medical: Not on file     Non-medical: Not on file   Tobacco Use   . Smoking status: Never Smoker   . Smokeless tobacco: Never Used   Substance and Sexual Activity   . Alcohol use: No   . Drug use: No   . Sexual activity: Not on file   Lifestyle   . Physical activity     Days per week: Not on file     Minutes per session: Not on file   . Stress: Not on file   Relationships   . Social Wellsite geologist on phone: Not on file     Gets together: Not on file     Attends religious service: Not on file     Active member of club or organization: Not on file     Attends meetings of clubs or organizations: Not on file     Relationship status: Not on file   . Intimate partner violence     Fear of current or ex partner: Not on file     Emotionally abused: Not on file     Physically abused: Not on file     Forced sexual activity: Not on file   Other Topics Concern   . Not on file   Social History Narrative   . Not on file           Review of Systems  Review of Systems   Constitutional: Positive for appetite change. Negative for chills, fatigue, fever and unexpected weight change.   HENT: Negative for sore throat, trouble swallowing and voice change.    Eyes: Negative for visual disturbance.   Respiratory: Negative for cough, shortness of breath and wheezing.    Cardiovascular: Negative for chest pain and palpitations.   Gastrointestinal: Positive for abdominal pain and diarrhea. Negative for blood in stool, nausea and vomiting.   Endocrine: Negative for cold intolerance, heat intolerance and polydipsia.   Genitourinary: Negative for dysuria, flank pain and hematuria.   Musculoskeletal: Negative for gait problem, joint swelling and myalgias.   Skin: Negative for color change and rash.    Allergic/Immunologic: Negative for immunocompromised state.   Neurological: Negative for dizziness, tremors, seizures and speech difficulty.   Hematological: Does not bruise/bleed easily.   Psychiatric/Behavioral: Negative for behavioral problems, confusion and suicidal ideas.       OBJECTIVE     BP 120/80 (Site: Right Upper Arm, Position: Sitting, Cuff Size: Medium Adult)   Pulse 110   Temp 97 F (36.1 C) (Temporal)   Resp 18   Ht 5' 6.5" (1.689 m)   Wt 202 lb 8 oz (91.9 kg)   SpO2 93%   BMI 32.19 kg/m      Physical Exam  Vitals signs reviewed.   Constitutional:       General:  She is not in acute distress.     Appearance: Normal appearance. She is well-developed. She is not ill-appearing or diaphoretic.   HENT:      Head: Normocephalic and atraumatic.      Nose: Nose normal. No congestion.   Eyes:      General: No scleral icterus.     Extraocular Movements: Extraocular movements intact.      Pupils: Pupils are equal, round, and reactive to light.   Neck:      Musculoskeletal: Normal range of motion and neck supple. No neck rigidity.      Thyroid: No thyromegaly.      Vascular: No JVD.      Trachea: No tracheal deviation.   Cardiovascular:      Rate and Rhythm: Normal rate and regular rhythm.      Heart sounds: Normal heart sounds. No murmur.   Pulmonary:      Effort: Pulmonary effort is normal. No respiratory distress.      Breath sounds: Normal breath sounds. No stridor. No wheezing or rhonchi.   Abdominal:      General: Abdomen is flat. Bowel sounds are normal. There is no distension.      Palpations: Abdomen is soft. There is no mass.      Tenderness: There is abdominal tenderness. There is no guarding or rebound.      Hernia: No hernia is present.      Comments: Subjective tenderness to palpation mostly in the epigastrium and right side of the abdomen.  Abdomen very soft.  No rebound nor guarding.   Musculoskeletal: Normal range of motion.      Right lower leg: No edema.      Left lower leg: No  edema.   Skin:     General: Skin is warm and dry.      Coloration: Skin is not jaundiced or pale.      Findings: No bruising or rash.   Neurological:      General: No focal deficit present.      Mental Status: She is alert and oriented to person, place, and time.      Cranial Nerves: No cranial nerve deficit.   Psychiatric:         Mood and Affect: Mood normal.         Behavior: Behavior normal.         Thought Content: Thought content normal.         Lab Results   Component Value Date    WBC 6.7 05/08/2018    HGB 13.7 05/08/2018    HCT 42.0 05/08/2018    PLT 412 (H) 05/08/2018    CHOL 189 09/02/2011    TRIG 156 09/02/2011    HDL 43 09/02/2011    ALT 30 05/08/2018    AST 29 05/08/2018    NA 138 05/08/2018    K 3.8 05/08/2018    CL 97 (L) 05/08/2018    CREATININE 0.6 05/08/2018    BUN 8 05/08/2018    CO2 24 05/08/2018    TSH 5.380 (H) 01/29/2015    INR 0.93 11/13/2015    LABA1C 6.2 02/23/2011     Amylase, lipase, C-reactive protein, sedimentation rate all normal yesterday  H. pylori antigen positive.       PROCEDURE: CT ABDOMEN PELVIS W IV CONTRAST      CLINICAL INFORMATION: abd pain .      COMPARISON: None.      TECHNIQUE: 5 mm axial CT images  were obtained through the abdomen and pelvis after the administration of intravenous and oral contrast. Coronal and sagittal reconstructions were obtained.      All CT scans at this facility use dose modulation, iterative reconstruction, and/or weight-based dosing when appropriate to reduce radiation dose to as low as reasonably achievable.      FINDINGS:         The visualized aspects of the lung bases are clear.  The base of the heart is within appropriate limits.      The liver shows mild fatty change. Postcholecystectomy clips are present. There is no biliary obstruction. Mild prominence of the extrahepatic bile duct is compatible with postsurgical changes. The pancreas and spleen are negative for active pathology.    The gastric body is nondistended.       Bilateral adrenal glands and the kidneys are negative for active pathology. Small cortical cysts of the left and right kidney are present. A punctate, nonobstructing calculus in the right mid pole is present. The perinephric spaces are clear.      There is no evidence of acute bowel obstruction. There are changes of the distal ileum with visualized wall thickening, changes of enteritis are suspected. The appendix is not clearly visualized and may be surgically absent.      There are post hysterectomy changes. The urinary bladder is unremarkable.      Skeleton shows degenerative changes with no acute fracture. There is no aneurysm present         Impression   .   1. Thickening of the distal ileum adjacent to the ileocecal valve, correlate with enteritis. Etiologies may include acute infectious process or more chronic long-standing changes such as inflammatory bowel disease.   2. Post hysterectomy and cholecystectomy changes.   3. Mild fatty liver changes.      **This report has been created using voice recognition software. It may contain minor errors which are inherent in voice recognition technology.**      Final report electronically signed by Dr. Helyn Numbers on 04/27/2018 12:14 AM       Imaging reviewed.

## 2018-06-06 ENCOUNTER — Inpatient Hospital Stay: Payer: PRIVATE HEALTH INSURANCE

## 2018-06-06 DIAGNOSIS — E1165 Type 2 diabetes mellitus with hyperglycemia: Secondary | ICD-10-CM

## 2018-06-06 LAB — CBC
Hematocrit: 41.9 % (ref 37.0–47.0)
Hemoglobin: 13.9 gm/dl (ref 12.0–16.0)
MCH: 29 pg (ref 26.0–33.0)
MCHC: 33.2 gm/dl (ref 32.2–35.5)
MCV: 87.3 fL (ref 81.0–99.0)
MPV: 9.7 fL (ref 9.4–12.4)
Platelets: 381 10*3/uL (ref 130–400)
RBC: 4.8 10*6/uL (ref 4.20–5.40)
RDW-CV: 11.8 % (ref 11.5–14.5)
RDW-SD: 38 fL (ref 35.0–45.0)
WBC: 6 10*3/uL (ref 4.8–10.8)

## 2018-06-06 LAB — COMPREHENSIVE METABOLIC PANEL
ALT: 18 U/L (ref 11–66)
AST: 17 U/L (ref 5–40)
Albumin: 4.3 g/dL (ref 3.5–5.1)
Alkaline Phosphatase: 122 U/L (ref 38–126)
BUN: 10 mg/dL (ref 7–22)
CO2: 28 meq/L (ref 23–33)
Calcium: 9.6 mg/dL (ref 8.5–10.5)
Chloride: 96 meq/L — ABNORMAL LOW (ref 98–111)
Creatinine: 0.6 mg/dL (ref 0.4–1.2)
Glucose: 326 mg/dL — ABNORMAL HIGH (ref 70–108)
Potassium: 3.6 meq/L (ref 3.5–5.2)
Sodium: 135 meq/L (ref 135–145)
Total Bilirubin: 0.4 mg/dL (ref 0.3–1.2)
Total Protein: 6.9 g/dL (ref 6.1–8.0)

## 2018-06-06 LAB — ANION GAP: Anion Gap: 11 meq/L (ref 8.0–16.0)

## 2018-06-06 LAB — LIPID PANEL
Cholesterol, Total: 207 mg/dL — ABNORMAL HIGH (ref 100–199)
HDL: 33 mg/dL
LDL Calculated: 112 mg/dL
Triglycerides: 311 mg/dL — ABNORMAL HIGH (ref 0–199)

## 2018-06-06 LAB — GLOMERULAR FILTRATION RATE, ESTIMATED: Est, Glom Filt Rate: >90 mL/min/{1.73_m2}

## 2018-06-06 LAB — HEPATITIS C ANTIBODY: Hepatitis C Ab: NEGATIVE

## 2018-06-06 LAB — HEPATITIS B SURFACE ANTIGEN: Hepatitis B Surface Ag: NEGATIVE

## 2018-06-08 ENCOUNTER — Encounter: Attending: Surgery

## 2018-06-09 ENCOUNTER — Telehealth: Admit: 2018-06-09 | Discharge: 2018-06-09 | Payer: PRIVATE HEALTH INSURANCE | Attending: Nurse Practitioner

## 2018-06-09 DIAGNOSIS — M47816 Spondylosis without myelopathy or radiculopathy, lumbar region: Secondary | ICD-10-CM

## 2018-06-09 MED ORDER — HYDROCODONE-ACETAMINOPHEN 5-325 MG PO TABS
5-325 MG | ORAL_TABLET | Freq: Four times a day (QID) | ORAL | 0 refills | Status: AC | PRN
Start: 2018-06-09 — End: 2018-07-09

## 2018-06-09 NOTE — Progress Notes (Addendum)
DEMERIA LADINO is a 63 y.o. female evaluated via telephone/virtual visit on 06/09/2018.      Consent:  She and/or health care decision maker is aware that that she may receive a bill for this telephone/virtual service, depending on her insurance coverage, and has provided verbal consent to proceed: Yes    Communication completed via : [email or web address]    I affirm this is a Patient Initiated Episode with an Established Patient who has not had a related appointment within my department in the past 7 days or scheduled within the next 24 hours.      Documentation:  I communicated with the patient and/or health care decision maker the medical information as listed below.   Details of this discussion including any medical advice provided -- SEE Assessment and Plan.       Total Time: minutes: 11-20 minutes    Note: not billable if this call serves to triage the patient into an appointment for the relevant concern      [place]    Physical Medicine & Rehabilitation  Outpatient progress note    Chief Complaint:   Chief Complaint   Patient presents with   ??? Follow-up     chronic low back pain        Subjective: VERNECIA LAZER is a 63 y.o. female who returns to the office today for further follow up. Low back pain is stable. No new symptoms. Denies any radicular pain. Denies numbness/tingling, weakness, bowel/bladder issues. Her blood sugars have been elevated so she has been trying to exercise more. Was started on insulin.     Had H. Pylori which was treated with antibiotics. Completed treatment. Now feeling better.     Medications reviewed. Patient denies side effects with medications. Patient states she is taking medications as prescribed. She denies receiving pain medications from other sources. She denies any ER visits since last visit.    Pain scale with out pain medications or at its worst is 6/10.  Pain scale with pain medications or at its best is  0/10.  Last dose of norco was last night  Drug screen reviewed from 03/10/2018 and was appropriate  Patient does not have naloxone available at home. Patient has not required use of naloxone at home since last office visit.     Review of Systems:  Review of Systems   Constitutional: Positive for fatigue. Negative for chills and fever.   HENT: Negative for congestion and trouble swallowing.    Eyes: Negative for pain and redness.   Respiratory: Negative for cough and shortness of breath.    Cardiovascular: Negative for chest pain and leg swelling.   Gastrointestinal: Negative for constipation, diarrhea and nausea.   Endocrine: Negative for cold intolerance and heat intolerance.   Genitourinary: Negative for difficulty urinating and frequency.   Musculoskeletal: Positive for back pain and neck pain. Negative for joint swelling.   Skin: Negative for rash.   Neurological: Negative for weakness, light-headedness and numbness.   Hematological: Negative for adenopathy.   Psychiatric/Behavioral: Negative for suicidal ideas. The patient is nervous/anxious.         Depression       Physical Exam:  There were no vitals taken for this visit.    awake  Orientation:   person, place, time  Mood: within normal limits  Affect: calm  General appearance: Patient is well nourished, well developed,  well groomed and in no acute distress    Memory:   normal,   Attention/Concentration: normal  Language:  normal    ROM:  Freely moves all 4 limbs    Skin: warm and dry, no rash or erythema        Impression:  ?? Lumbar back pain   ?? Lumbar spondylosis  ?? Left lower limb radiculopathy  ?? Left cervical radiculopathy s/p prior ACDF 2008  ?? Plantar fasciitis  ?? Lumbar laminectomy Oct 2017    Plan:  ?? Continue Norco 5-325 4 times daily PRN- paper RX [place]  ?? Encouraged continued physical activity  ?? OARRS reviewed. Current MED: 15  ?? Patient was offered naloxone for home. RX sent 04/04/2017.   ?? Discussed  long term side effects of medications, tolerance, dependency and addiction.   ?? Previous UDS reviewed from 12/12/2017  ?? UDS preformed today for compliance.  ?? Patient told can not receive any pain medications from any other source.  ?? No evidence of abuse, diversion or aberrant behavior.  ??? Medications and/or procedures to improve function and quality of life- patient understanding with this and that may not be pain free  ??? Discussed with patient about safe storage of medications at home  ??? Discussed possible weaning of medication dosing dependent on treatment/procedure results.     Will continue to monitor any benefits vs side effects of the medications as prescribed.  The patient has been warned about the risk of operating machinery including driving if impaired in any way by these medications.  The patient also accepts the risks of tolerance, dependency, or addiction related to the prescribed medications.  All questions were answered.  Reevaluation as planned, or sooner if requested.     Controlled Substances Monitoring: Periodic Controlled Substance Monitoring: Possible medication side effects, risk of tolerance/dependence & alternative treatments discussed., No signs of potential drug abuse or diversion identified., Assessed functional status. Marland Kitchen(Rocky MorelBrittany N Eryka Dolinger, APRN - CNP)    Return in about 3 months (around 09/09/2018).     It was my pleasure to evaluate Kennedy BuckerSandra A Hellums today.  Please call with any concerns or questions.  14 minutes spent in evaluation efforts    Rocky MorelBrittany N Adrianna Dudas, APRN - CNP

## 2018-06-09 NOTE — Telephone Encounter (Signed)
Called patient to schedule a 3 month follow up from today's visit with [place]. Left a voice message requesting a call back.

## 2018-06-21 ENCOUNTER — Inpatient Hospital Stay: Payer: PRIVATE HEALTH INSURANCE

## 2018-06-21 DIAGNOSIS — B9681 Helicobacter pylori [H. pylori] as the cause of diseases classified elsewhere: Secondary | ICD-10-CM

## 2018-06-23 LAB — H. PYLORI ANTIGEN, STOOL

## 2018-07-04 ENCOUNTER — Inpatient Hospital Stay: Payer: PRIVATE HEALTH INSURANCE

## 2018-07-04 DIAGNOSIS — E119 Type 2 diabetes mellitus without complications: Secondary | ICD-10-CM

## 2018-07-04 LAB — BASIC METABOLIC PANEL
BUN: 12 mg/dL (ref 7–22)
CO2: 25 meq/L (ref 23–33)
Calcium: 9.7 mg/dL (ref 8.5–10.5)
Chloride: 98 meq/L (ref 98–111)
Creatinine: 0.7 mg/dL (ref 0.4–1.2)
Glucose: 206 mg/dL — ABNORMAL HIGH (ref 70–108)
Potassium: 3.7 meq/L (ref 3.5–5.2)
Sodium: 138 meq/L (ref 135–145)

## 2018-07-04 LAB — HEMOGLOBIN A1C
AVERAGE GLUCOSE: 222 mg/dL — ABNORMAL HIGH (ref 70–126)
Hemoglobin A1C: 9.4 % — ABNORMAL HIGH (ref 4.4–6.4)

## 2018-07-04 LAB — GLOMERULAR FILTRATION RATE, ESTIMATED: Est, Glom Filt Rate: 85 mL/min/{1.73_m2} — AB

## 2018-07-04 LAB — ANION GAP: Anion Gap: 15 meq/L (ref 8.0–16.0)

## 2018-07-06 LAB — C-PEPTIDE, SERUM: C-Peptide: 3.2 ng/mL (ref 1.1–4.4)

## 2018-07-10 ENCOUNTER — Ambulatory Visit: Admit: 2018-07-10 | Discharge: 2018-07-10 | Payer: PRIVATE HEALTH INSURANCE | Attending: Physician Assistant

## 2018-07-10 DIAGNOSIS — Z9989 Dependence on other enabling machines and devices: Secondary | ICD-10-CM

## 2018-07-10 NOTE — Progress Notes (Signed)
[facility]      MAKIYA JEUNE         [id number]  07/10/2018   Chief Complaint   Patient presents with   . Follow-up     Sleep 1 year follow up with an [employer] pap download        Pt of Dr. Jenetta Downer    PAP Download:   Original or initial AHI: 14.3     Date of initial study: 09/20/2014      Compliant  100%     Noncompliant 0 %     PAP Type AutoSet Level  7   Avg Hrs/Day 9 hours 44 minutes  AHI: 1.9   Recorded compliance dates , 06/07/2018  to 07/06/2018   Machine/Mfg: ResMed  Interface: Nasal    Provider:    __[facility]           _x_[facility]        __ Elisha Headland    __ [facility]            __[facility]   __[place]:       __Other    Neck Size: 13.5  Mallampati Mallampati 4  ESS:  0      Here is a scan of the most recent download:          Presentation:   Josalin presents for sleep medicine follow up for obstructive sleep apnea  Since the last visit, Dymin is doing well with PAP.  She is sleeping well. She feels rested.      Equipment issues:  The pressure is  acceptable, the mask is acceptable     Sleep issues:  Do you feel better? Yes  More rested?Yes   Better concentration? yes    Progress History:   Since last visit any new medical issues? No  New ER or hospitlal visits? No  Any new or changes in medicines? No  Any new sleep medicines? No        Past Medical History:   Diagnosis Date   . Depression    . Diabetes mellitus (HCC)     type 2-on Lantus   . H. pylori infection 05/03/2018    stool   . Hyperlipidemia    . Hypertension    . Nausea & vomiting    . Panic disorder    . PONV (postoperative nausea and vomiting)    . Psychiatric problem    . Sleep apnea     on cpap       Past Surgical History:   Procedure Laterality Date   . BACK SURG[place]   . CARDIOVASCULAR STRESS TEST  2018   . CARDIOVASCULAR STRESS TEST  2016   . CHOLECYSTECTOMY  2007    Dr Neoma Laming   . COLONOSCOPY      2006-Dr Neidich   . HEMORRHOID SURGERY  02/2011    Dr Mackey Birchwood skin tag removal    . HYSTERECTOMY  2007    Dr Marlou Porch   . NECK SURGERY  2008 [facility]    [facility]   . NERVE BLOCK  10/29/2014    LUMBAR FACET INJECTION   . OTHER SURGICAL HISTORY  11/05/2010    hemmoridectomy-Dr Olt   . OTHER SURGICAL HISTORY Bilateral 01/06/2015    Lumbar Facet   . OTHER SURGICAL HISTORY  09/2010    hemorrhoid banding-Dr Neidich   . OTHER SURGICAL HISTORY  09/2010    hemorrhoid  banding-Dr Neidich   . PR CREATE EARDRUM OPENING,GEN ANESTH Left 07/19/2016    MYRINGOTOMY TYPANOSTOMY TUBE PLACEMENT, LEFT performed by Geoffery Lyons, [facility] at [facility]   . SKIN BIOPSY  2007    left upper arm-benign Dr Rosie Fate   . TYMPANOSTOMY TUBE PLACEMENT  2006,2011,2016       Social History     Tobacco Use   . Smoking status: Never Smoker   . Smokeless tobacco: Never Used   Substance Use Topics   . Alcohol use: No   . Drug use: No       No Known Allergies    Current Outpatient Medications   Medication Sig Dispense Refill   . NOVOLOG FLEXPEN 100 UNIT/ML injection pen      . metFORMIN (GLUCOPHAGE) 500 MG tablet Take 500 mg by mouth 2 times daily (with meals)     . HYDROcodone-acetaminophen (NORCO) 5-325 MG per tablet Take 1 tablet by mouth every 6 hours as needed for Pain.     . pravastatin (PRAVACHOL) 20 MG tablet      . ARIPiprazole (ABILIFY) 2 MG tablet      . Insulin Glargine (LANTUS SC) Inject into the skin 10 units daily increase by 1 unit daily until BS reach 150 goal     . ibuprofen (ADVIL;MOTRIN) 200 MG tablet Take 200 mg by mouth every 6 hours as needed for Pain     . diltiazem (TIAZAC) 300 MG extended release capsule TAKE 1 CAPSULE BY MOUTH ONE TIME A DAY  3   . hyoscyamine (LEVSIN/SL) 125 MCG sublingual tablet DISSOLVE 1 TABLET UNDER TONGUE EVERY FOUR HOURS AS NEEDED  5   . CPAP Machine MISC by Does not apply route Please check patient's PAP machine for proper functioning by [employer]. 1 each 0   . DULoxetine (CYMBALTA) 60 MG extended release capsule Take 60 mg by mouth daily     . DULoxetine (CYMBALTA) 30 MG  extended release capsule Take 30 mg by mouth daily     . losartan-hydrochlorothiazide (HYZAAR) 100-25 MG per tablet TAKE 1 TABLET BY MOUTH ONE TIME A DAY  11   . ONE TOUCH ULTRA TEST strip CHECK BLOOD SUGAR ONCE DAILY.  3   . zolpidem (AMBIEN) 10 MG tablet Take 10 mg by mouth nightly Takes every night     . lorazepam (ATIVAN) 1 MG tablet Take 1 mg by mouth every 6 hours as needed.         No current facility-administered medications for this visit.        Family History   Problem Relation Age of Onset   . Diabetes Other    . Hypertension Other    . Mental Illness Other    . Heart Disease Other    . Other Other         bone and joint problems   . COPD Mother    . Hypertension Mother    . Diabetes Mother    . High Cholesterol Mother    . Depression Mother    . Alcohol Abuse Mother    . Hypertension Father    . Diabetes Father    . High Cholesterol Father    . Heart Attack Father         Review of Systems -   Review of Systems   Constitutional: Negative for activity change, appetite change, chills and fever.   HENT: Negative for congestion and postnasal drip.    Eyes:  Negative.    Respiratory: Negative for cough, chest tightness, shortness of breath, wheezing and stridor.    Cardiovascular: Negative for chest pain and leg swelling.   Gastrointestinal: Negative for diarrhea and nausea.   Endocrine: Negative.    Genitourinary: Negative.    Musculoskeletal: Negative.  Negative for arthralgias and back pain.   Skin: Negative.    Allergic/Immunologic: Negative.    Neurological: Negative.  Negative for dizziness and light-headedness.   Psychiatric/Behavioral: Negative.    All other systems reviewed and are negative.       Physical Exam:    BMI:  Body mass index is 33.2 kg/m.    Wt Readings from Last 3 Encounters:   07/10/18 208 lb 12.8 oz (94.7 kg)   05/09/18 202 lb 8 oz (91.9 kg)   04/26/18 200 lb (90.7 kg)     Weight stable / unchanged  Vitals: BP 132/76 (Site: Left Upper Arm, Position: Sitting, Cuff Size: Large Adult)    Pulse 96   Temp 98.2 F (36.8 C)   Ht 5' 6.5" (1.689 m)   Wt 208 lb 12.8 oz (94.7 kg)   SpO2 96% Comment: on room air at rest  BMI 33.20 kg/m       Physical Exam  Constitutional:       Appearance: She is well-developed.   HENT:      Head: Normocephalic and atraumatic.      Right Ear: External ear normal.      Left Ear: External ear normal.   Eyes:      Conjunctiva/sclera: Conjunctivae normal.      Pupils: Pupils are equal, round, and reactive to light.   Neck:      Musculoskeletal: Normal range of motion and neck supple.   Cardiovascular:      Rate and Rhythm: Normal rate and regular rhythm.      Heart sounds: Normal heart sounds.   Pulmonary:      Effort: Pulmonary effort is normal.      Breath sounds: Normal breath sounds.   Musculoskeletal: Normal range of motion.   Skin:     General: Skin is warm and dry.   Neurological:      Mental Status: She is alert and oriented to person, place, and time.   Psychiatric:         Behavior: Behavior normal.         Thought Content: Thought content normal.         Judgment: Judgment normal.           ASSESSMENT/DIAGNOSIS     Diagnosis Orders   1. OSA on CPAP     2. Obesity (BMI 30-39.9)              Plan   Do you need any equipment today? Yes update supplies    - She  was advised to continue current positive airway pressure therapy with above described pressure.   - She  advised to keep good compliance with current recommended pressure to get optimal results and clinical improvement  - Recommend 7-9 hours of sleep with PAP  - She was advised to call DME company regarding supplies if needed.   -She call my office for earlier appointment if needed for worsening of sleep symptoms.   - She was instructed on weight loss  - Caydance was educated about my impression and plan. Patient verbalizesunderstanding.  We will see ZAKIRA RESSEL back in: 1 year with download    Information added  by my medical assistant/LPN was reviewed today         Jamie Brookes PA-C, MPAS  07/10/2018

## 2018-07-17 NOTE — Telephone Encounter (Signed)
Attempted to call patient. Got voicemail, left message to call the office.

## 2018-07-17 NOTE — Telephone Encounter (Signed)
Patient left a message for a call back.     Attempted to call patient. Got voicemail, left message to call the office.

## 2018-07-17 NOTE — Telephone Encounter (Signed)
The patient is scheduled 07/31/18 with Theresa Vargas. Ok to move the appointment up to a sooner available time with Theresa Vargas or myself.

## 2018-07-17 NOTE — Telephone Encounter (Signed)
Dr. Lennice Sites office called and requested we see patient sooner. Patient is scheduled 07/31/2018. Patient stated that she is having ear popping, fullness and dizziness. Dr. Lennice Sites office stated the tube looks like it is blocked.They stated they were going to offer patient antibiotic, however, Dr. Seleta Rhymes does not believe it is infected.    Asked if patient was using ear drops and they denied using drops.     Please advise.

## 2018-07-17 NOTE — Telephone Encounter (Signed)
Appointment made for 07/21/2018.

## 2018-07-21 ENCOUNTER — Ambulatory Visit: Admit: 2018-07-21 | Discharge: 2018-07-21 | Payer: PRIVATE HEALTH INSURANCE | Attending: Family

## 2018-07-21 DIAGNOSIS — T85898A Other specified complication of other internal prosthetic devices, implants and grafts, initial encounter: Secondary | ICD-10-CM

## 2018-07-21 MED ORDER — CIPROFLOXACIN HCL 0.3 % OP SOLN
0.3 % | OPHTHALMIC | 1 refills | Status: DC
Start: 2018-07-21 — End: 2019-03-01

## 2018-07-21 NOTE — Progress Notes (Deleted)
[facility]  [facility]  [address]  [address]  [place] [id number]  Dept: [phone]  Dept Fax: [phone]  Loc: [phone]    Theresa Vargas is a 63 y.o. female who was referred by No ref. provider found for:  Chief Complaint   Patient presents with   ??? Other     Left ear popping and crackling causing her to be dizzy, Room does not spin, has taken 1/2 tablet of draminin if has severe dizziness, had mastoiditis.  Her PCP gave her Amoxicillin. 6 month tube check.     Marland Kitchen.    HPI:     Theresa Vargas is a 63 y.o.      History:     No Known Allergies  Current Outpatient Medications   Medication Sig Dispense Refill   ??? amoxicillin-clavulanate (AUGMENTIN) 875-125 MG per tablet Take 1 tablet by mouth 1 tablet q 12 hours     ??? NOVOLOG FLEXPEN 100 UNIT/ML injection pen      ??? metFORMIN (GLUCOPHAGE) 500 MG tablet Take 500 mg by mouth 2 times daily (with meals)     ??? HYDROcodone-acetaminophen (NORCO) 5-325 MG per tablet Take 1 tablet by mouth every 6 hours as needed for Pain.     ??? pravastatin (PRAVACHOL) 20 MG tablet      ??? ARIPiprazole (ABILIFY) 2 MG tablet      ??? Insulin Glargine (LANTUS SC) Inject into the skin 10 units daily increase by 1 unit daily until BS reach 150 goal     ??? ibuprofen (ADVIL;MOTRIN) 200 MG tablet Take 200 mg by mouth every 6 hours as needed for Pain     ??? diltiazem (TIAZAC) 300 MG extended release capsule TAKE 1 CAPSULE BY MOUTH ONE TIME A DAY  3   ??? hyoscyamine (LEVSIN/SL) 125 MCG sublingual tablet DISSOLVE 1 TABLET UNDER TONGUE EVERY FOUR HOURS AS NEEDED  5   ??? CPAP Machine MISC by Does not apply route Please check patient's PAP machine for proper functioning by [employer]. 1 each 0   ??? DULoxetine (CYMBALTA) 60 MG extended release capsule Take 60 mg by mouth daily     ??? DULoxetine (CYMBALTA) 30 MG extended release capsule Take 30 mg by mouth daily     ??? losartan-hydrochlorothiazide (HYZAAR) 100-25 MG per tablet TAKE 1 TABLET BY MOUTH ONE  TIME A DAY  11   ??? ONE TOUCH ULTRA TEST strip CHECK BLOOD SUGAR ONCE DAILY.  3   ??? zolpidem (AMBIEN) 10 MG tablet Take 10 mg by mouth nightly Takes every night     ??? lorazepam (ATIVAN) 1 MG tablet Take 1 mg by mouth every 6 hours as needed.         No current facility-administered medications for this visit.      Past Medical History:   Diagnosis Date   ??? Depression    ??? 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 H. pylori infection 05/03/2018    stool   ??? Hyperlipidemia    ??? Hypertension    ??? Nausea & vomiting    ??? Panic disorder    ??? PONV (postoperative nausea and vomiting)    ??? Psychiatric problem    ??? Sleep apnea     on cpap      Past Surgical History:   Procedure Laterality Date   ??? [place] SUR[place]   ??? CARDIOVASCULAR STRESS TEST  2018   ??? CARDIOVASCULAR STRESS TEST  2016   ??? CHOLECYSTECTOMY  2007    Dr Neoma Laminglt   ??? COLONOSCOPY      2006-Dr Neidich   ??? HEMORRHOID SURGERY  02/2011    Dr Mackey Birchwoodlt-anal skin tag removal   ??? HYSTERECTOMY  2007    Dr Marlou PorchHerrick   ??? NECK SURGERY  2008 [place] [facility]    ACDF   ??? NERVE BLOCK  10/29/2014    LUMBAR FACET INJECTION   ??? OTHER SURGICAL HISTORY  11/05/2010    hemmoridectomy-Dr Olt   ??? OTHER SURGICAL HISTORY Bilateral 01/06/2015    Lumbar Facet   ??? OTHER SURGICAL HISTORY  09/2010    hemorrhoid banding-Dr Neidich   ??? OTHER SURGICAL HISTORY  09/2010    hemorrhoid banding-Dr Neidich   ??? PR CREATE EARDRUM OPENING,GEN ANESTH Left 07/19/2016    MYRINGOTOMY TYPANOSTOMY TUBE PLACEMENT, LEFT performed [facility]an [facility]   ??? SKIN BIOPSY  2007    left upper arm-benign Dr Rosie FateHeaphy   ??? TYMPANOSTOMY TUBE PLACEMENT  2006,2011,2016     Family History   Problem Relation Age of Onset   ??? Diabetes Other    ??? Hypertension Other    ??? Mental Illness Other    ??? Heart Disease Other    ??? Other Other         bone and joint problems   ??? COPD Mother    ??? Hypertension Mother    ??? Diabetes Mother    ??? High Cholesterol Mother    ??? Depression Mother    ??? Alcohol  Abuse Mother    ??? Hypertension Father    ??? Diabetes Father    ??? High Cholesterol Father    ??? Heart Attack Father      Social History     Tobacco Use   ??? Smoking status: Never Smoker   ??? Smokeless tobacco: Never Used   Substance Use Topics   ??? Alcohol use: No        Subjective:      Review of Systems   Constitutional: Negative for activity change, appetite change, chills, diaphoresis, fatigue, fever and unexpected weight change.   HENT: Negative for congestion, dental problem, ear discharge, ear pain, facial swelling, hearing loss, mouth sores, nosebleeds, postnasal drip, rhinorrhea, sinus pressure, sneezing, sore throat, tinnitus, trouble swallowing and voice change.    Eyes: Negative for visual disturbance.   Respiratory: Negative for apnea, cough, choking, chest tightness, shortness of breath, wheezing and stridor.    Cardiovascular: Negative for chest pain, palpitations and leg swelling.   Gastrointestinal: Negative for abdominal pain, diarrhea, nausea and vomiting.   Endocrine: Negative for cold intolerance, heat intolerance, polydipsia and polyuria.   Genitourinary: Negative for difficulty urinating, dysuria, enuresis, hematuria and urgency.   Musculoskeletal: Negative for arthralgias, gait problem, neck pain and neck stiffness.   Skin: Negative for color change and rash.   Allergic/Immunologic: Negative for environmental allergies, food allergies and immunocompromised state.   Neurological: Negative for dizziness, syncope, facial asymmetry, speech difficulty, light-headedness and headaches.   Hematological: Negative for adenopathy. Does not bruise/bleed easily.   Psychiatric/Behavioral: Negative for confusion and sleep disturbance. The patient is not nervous/anxious.      Rest of review of systems are negative, except as noted in HPI.     Objective:     BP (!) 130/90 (Site: Left Upper Arm)  Pulse 68    Temp 97.9 ??F (36.6 ??C) (Oral)    Resp 20    Ht 5' 6.5" (1.689 m)    Wt 206 lb 1.6 oz (93.5 kg)    BMI 32.77  kg/m??     PHYSICAL EXAM  Constitutional: Oriented to person, place, and time. appears well-developed and well-nourished. No distress.   HENT:   Head: Normocephalic and atraumatic.   Right Ear:  External ear normal. Cerumen removed with *** under magnification. Tympanic membrane intact. Middle ear aerated.    Left Ear:  External ear normal. Cerumen removed with *** under magnification. Tympanic membrane intact. Middle ear aerated.    Nose:  External nose normal. Nasal mucosa normal. No lesions noted.  Mouth/Throat:  Fair dentition. Oral cavity mucosa normal, no masses or lesions noted. Oropharynx is clear and moist.   Eyes:  Pupils are equal, round, and reactive to light. Conjunctivae and EOM are normal.   Neck:  Normal range of motion. Neck supple. No JVD present. No tracheal deviation present. No thyromegaly present. No cervical lymphadenopathy noted.  Cardiovascular:  Normal rate.   Pulmonary/Chest:  Effort normal. No stridor or stertor. No respiratory distress.   Musculoskeletal:  Normal range of motion. No edema or lymphadenopathy.  Neurological:  Alert and oriented to person, place, and time.   Cranial nerve II-XII grossly intact.  Skin:  Skin is warm. No erythema.   Psychiatric:  Normal mood and affect. Behavior is normal.   Vitals reviewed.    Data:  All of the past medical history, past surgical history, family history,social history, allergies and current medications were reviewed with the patient.     Assessment & Plan   Diagnoses and all orders for this visit:    {No diagnosis found. (Refresh or delete this SmartLink)}    The findings were explained and her questions were answered.  Recommend return for routine cerumen cleaning.      No follow-ups on file.           **This report has been created using voice recognition software. It may contain minor errors which are inherent in voice recognition technology.**

## 2018-07-21 NOTE — Progress Notes (Signed)
[facility]  [facility]  [address]  [address]  [place] [id number]  Dept: [phone]  Dept Fax: [phone]  Loc: [phone]    Theresa Vargas is a 63 y.o. female who was referred by No ref. provider found for:  Chief Complaint   Patient presents with   ??? Other     Left ear popping and crackling causing her to be dizzy, Room does not spin, has taken 1/2 tablet of draminin if has severe dizziness, had mastoiditis.  Her PCP gave her Amoxicillin. 6 month tube check.     Marland Kitchen.    HPI:     Theresa BuckerSandra A Duffett is a 63 y.o. female here for tube check. She had left PET placed 07/19/16 by Dr. Andrey Farmerossi for chronic left middle ear effusion (this was the 3rd tube).????She is having left ear discomfort, popping and cracking for the last 4 or 5 days.  She was seen with PCP- told left tube was blocked and placed on Augmentin (no drops).      History:     No Known Allergies  Current Outpatient Medications   Medication Sig Dispense Refill   ??? amoxicillin-clavulanate (AUGMENTIN) 875-125 MG per tablet Take 1 tablet by mouth 1 tablet q 12 hours     ??? ciprofloxacin (CILOXAN) 0.3 % ophthalmic solution 4 drops left ear twice daily 1 Bottle 1   ??? NOVOLOG FLEXPEN 100 UNIT/ML injection pen      ??? metFORMIN (GLUCOPHAGE) 500 MG tablet Take 500 mg by mouth 2 times daily (with meals)     ??? HYDROcodone-acetaminophen (NORCO) 5-325 MG per tablet Take 1 tablet by mouth every 6 hours as needed for Pain.     ??? pravastatin (PRAVACHOL) 20 MG tablet      ??? ARIPiprazole (ABILIFY) 2 MG tablet      ??? Insulin Glargine (LANTUS SC) Inject into the skin 10 units daily increase by 1 unit daily until BS reach 150 goal     ??? ibuprofen (ADVIL;MOTRIN) 200 MG tablet Take 200 mg by mouth every 6 hours as needed for Pain     ??? diltiazem (TIAZAC) 300 MG extended release capsule TAKE 1 CAPSULE BY MOUTH ONE TIME A DAY  3   ??? hyoscyamine (LEVSIN/SL) 125 MCG sublingual tablet DISSOLVE 1 TABLET UNDER TONGUE EVERY FOUR HOURS AS  NEEDED  5   ??? CPAP Machine MISC by Does not apply route Please check patient's PAP machine for proper functioning by [employer]. 1 each 0   ??? DULoxetine (CYMBALTA) 60 MG extended release capsule Take 60 mg by mouth daily     ??? DULoxetine (CYMBALTA) 30 MG extended release capsule Take 30 mg by mouth daily     ??? losartan-hydrochlorothiazide (HYZAAR) 100-25 MG per tablet TAKE 1 TABLET BY MOUTH ONE TIME A DAY  11   ??? ONE TOUCH ULTRA TEST strip CHECK BLOOD SUGAR ONCE DAILY.  3   ??? zolpidem (AMBIEN) 10 MG tablet Take 10 mg by mouth nightly Takes every night     ??? lorazepam (ATIVAN) 1 MG tablet Take 1 mg by mouth every 6 hours as needed.         No current facility-administered medications for this visit.      Past Medical History:   Diagnosis Date   ??? Depression    ??? 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 H. pylori infection  05/03/2018    stool   ??? Hyperlipidemia    ??? Hypertension    ??? Nausea & vomiting    ??? Panic disorder    ??? PONV (postoperative nausea and vomiting)    ??? Psychiatric problem    ??? Sleep apnea     on cpap      Past Surgical History:   Procedure Laterality Date   ??? [place] SUR[place]   ??? CARDIOVASCULAR STRESS TEST  2018   ??? CARDIOVASCULAR STRESS TEST  2016   ??? CHOLECYSTECTOMY  2007    Dr Christean Leaf   ??? COLONOSCOPY      2006-Dr Neidich   ??? HEMORRHOID SURGERY  02/2011    Dr Lincoln Brigham skin tag removal   ??? HYSTERECTOMY  2007    Dr Louis Meckel   ??? NECK SURGERY  2008  clinic    [facility]   ??? NERVE BLOCK  10/29/2014    LUMBAR FACET INJECTION   ??? OTHER SURGICAL HISTORY  11/05/2010    hemmoridectomy-Dr Olt   ??? OTHER SURGICAL HISTORY Bilateral 01/06/2015    Lumbar Facet   ??? OTHER SURGICAL HISTORY  09/2010    hemorrhoid banding-Dr Neidich   ??? OTHER SURGICAL HISTORY  09/2010    hemorrhoid banding-Dr Neidich   ??? PR CREATE EARDRUM OPENING,GEN ANESTH Left 07/19/2016    MYRINGOTOMY TYPANOSTOMY TUBE PLACEMENT, LEFT performed by Eldridge Abrahams, MD at [place]   ??? SKIN BIOPSY  2007    left  upper arm-benign Dr Kenna Gilbert   ??? TYMPANOSTOMY TUBE PLACEMENT  2006,2011,2016     Family History   Problem Relation Age of Onset   ??? Diabetes Other    ??? Hypertension Other    ??? Mental Illness Other    ??? Heart Disease Other    ??? Other Other         bone and joint problems   ??? COPD Mother    ??? Hypertension Mother    ??? Diabetes Mother    ??? High Cholesterol Mother    ??? Depression Mother    ??? Alcohol Abuse Mother    ??? Hypertension Father    ??? Diabetes Father    ??? High Cholesterol Father    ??? Heart Attack Father      Social History     Tobacco Use   ??? Smoking status: Never Smoker   ??? Smokeless tobacco: Never Used   Substance Use Topics   ??? Alcohol use: No        Subjective:      Review of Systems  Rest of review of systems are negative, except as noted in HPI.     Objective:     BP (!) 130/90 (Site: Left Upper Arm)    Pulse 68    Temp 97.9 ??F (36.6 ??C) (Oral)    Resp 20    Ht 5' 6.5" (1.689 m)    Wt 206 lb 1.6 oz (93.5 kg)    BMI 32.77 kg/m??     PHYSICAL EXAM  Constitutional: Oriented to person, place, and time. appears well-developed and well-nourished. No distress.   HENT:   External ears are normal: no scars, lesions or masses.   R External auditory canal- partially occluding cerumen removed with wire loop under magnification  L External auditory canal- partially occluding cerumen removed with wire loop under magnification  Tympanic membranes:  R intact, translucent  L tube in place- lumen blocked with dried serous secretions (dried secretions at lumen opening crusted between tube and canal wall, not allowing tube to be mobile with TM), crusting around base of tube, erythema of posterior TM     Vitals reviewed.    Data:  All of the past medical history, past surgical history, family history,social history, allergies and current medications were reviewed with the patient.     Assessment & Plan   Diagnoses and all orders for this visit:     Diagnosis Orders   1. Ventilation tube  blocked, initial encounter     2. Tubotympanic suppurative otitis media, left         The findings were explained and her questions were answered.    - Discussed water precautions.  - Continue on Augmentin  - Start Ciloxan drops, continue until next visit  - Follow up in 2-3 weeks      Return in about 2 weeks (around 08/04/2018).           **This report has been created using voice recognition software. It may contain minor errors which are inherent in voice recognition technology.**

## 2018-07-31 ENCOUNTER — Encounter: Attending: Family

## 2018-08-01 ENCOUNTER — Inpatient Hospital Stay: Payer: PRIVATE HEALTH INSURANCE

## 2018-08-01 DIAGNOSIS — I10 Essential (primary) hypertension: Secondary | ICD-10-CM

## 2018-08-01 LAB — CORTISOL TOTAL: Cortisol: 16.66 ug/dL

## 2018-08-01 LAB — TSH: TSH: 1.48 u[IU]/mL (ref 0.400–4.20)

## 2018-08-01 LAB — HEMOGLOBIN A1C
AVERAGE GLUCOSE: 186 mg/dL — ABNORMAL HIGH (ref 70–126)
Hemoglobin A1C: 8.2 % — ABNORMAL HIGH (ref 4.4–6.4)

## 2018-08-01 LAB — GLUCOSE, RANDOM: Glucose: 177 mg/dL — ABNORMAL HIGH (ref 70–108)

## 2018-08-01 LAB — T4, FREE: T4 Free: 1.06 ng/dL (ref 0.93–1.76)

## 2018-08-08 ENCOUNTER — Ambulatory Visit: Admit: 2018-08-08 | Discharge: 2018-08-08 | Payer: PRIVATE HEALTH INSURANCE | Attending: Otolaryngology

## 2018-08-08 DIAGNOSIS — H6642 Suppurative otitis media, unspecified, left ear: Secondary | ICD-10-CM

## 2018-08-08 NOTE — Progress Notes (Signed)
[facility]  [facility]  [address]  [address]  [place] [id number]  Dept: [phone]  Dept Fax: [phone]  Loc: [phone]    Theresa Vargas is a 63 y.o. female who was referred byNo ref. provider found for:  Chief Complaint   Patient presents with   ??? Follow-up     Here for 2 week follow up per crystal. Finished ear drops 08/06/2018   .    HPI:     Theresa Vargas is a 63 y.o. female who presents today for follow-up on her blocked left ventilation tube.  She has been using Ciloxan drops continue the Augmentin.  She states her left ear feels much better.  She has had 3 tubes placed over a period of 8 or 9 years..    History:     No Known Allergies  Current Outpatient Medications   Medication Sig Dispense Refill   ??? amoxicillin-clavulanate (AUGMENTIN) 875-125 MG per tablet Take 1 tablet by mouth 1 tablet q 12 hours     ??? ciprofloxacin (CILOXAN) 0.3 % ophthalmic solution 4 drops left ear twice daily 1 Bottle 1   ??? NOVOLOG FLEXPEN 100 UNIT/ML injection pen      ??? metFORMIN (GLUCOPHAGE) 500 MG tablet Take 500 mg by mouth 2 times daily (with meals)     ??? HYDROcodone-acetaminophen (NORCO) 5-325 MG per tablet Take 1 tablet by mouth every 6 hours as needed for Pain.     ??? pravastatin (PRAVACHOL) 20 MG tablet      ??? ARIPiprazole (ABILIFY) 2 MG tablet      ??? Insulin Glargine (LANTUS SC) Inject into the skin 10 units daily increase by 1 unit daily until BS reach 150 goal     ??? ibuprofen (ADVIL;MOTRIN) 200 MG tablet Take 200 mg by mouth every 6 hours as needed for Pain     ??? diltiazem (TIAZAC) 300 MG extended release capsule TAKE 1 CAPSULE BY MOUTH ONE TIME A DAY  3   ??? hyoscyamine (LEVSIN/SL) 125 MCG sublingual tablet DISSOLVE 1 TABLET UNDER TONGUE EVERY FOUR HOURS AS NEEDED  5   ??? CPAP Machine MISC by Does not apply route Please check patient's PAP machine for proper functioning by [employer]. 1 each 0   ??? DULoxetine (CYMBALTA) 60 MG extended release capsule  Take 60 mg by mouth daily     ??? DULoxetine (CYMBALTA) 30 MG extended release capsule Take 30 mg by mouth daily     ??? losartan-hydrochlorothiazide (HYZAAR) 100-25 MG per tablet TAKE 1 TABLET BY MOUTH ONE TIME A DAY  11   ??? ONE TOUCH ULTRA TEST strip CHECK BLOOD SUGAR ONCE DAILY.  3   ??? zolpidem (AMBIEN) 10 MG tablet Take 10 mg by mouth nightly Takes every night     ??? lorazepam (ATIVAN) 1 MG tablet Take 1 mg by mouth every 6 hours as needed.         No current facility-administered medications for this visit.      Past Medical History:   Diagnosis Date   ??? Depression    ??? 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 H. pylori infection 05/03/2018    stool   ??? Hyperlipidemia    ??? Hypertension    ??? Nausea & vomiting    ??? Panic disorder    ??? PONV (postoperative nausea and vomiting)    ???  Psychiatric problem    ??? Sleep apnea     on cpap      Past Surgical History:   Procedure Laterality Date   ??? BACK SURGERY  11/28/2015    [place]   ??? CARDIOVASCULAR STRESS TEST  2018   ??? CARDIOVASCULAR STRESS TEST  2016   ??? CHOLECYSTECTOMY  2007    Dr Neoma Laminglt   ??? COLONOSCOPY      2006-Dr Neidich   ??? HEMORRHOID SURGERY  02/2011    Dr Mackey Birchwoodlt-anal skin tag removal   ??? HYSTERECTOMY  2007    Dr Marlou PorchHerrick   ??? NECK SURGERY  2008 [facility]    [facility]   ??? NERVE BLOCK  10/29/2014    LUMBAR FACET INJECTION   ??? OTHER SURGICAL HISTORY  11/05/2010    hemmoridectomy-Dr Olt   ??? OTHER SURGICAL HISTORY Bilateral 01/06/2015    Lumbar Facet   ??? OTHER SURGICAL HISTORY  09/2010    hemorrhoid banding-Dr Neidich   ??? OTHER SURGICAL HISTORY  09/2010    hemorrhoid banding-Dr Neidich   ??? PR CREATE EARDRUM OPENING,GEN ANESTH Left 07/19/2016    MYRINGOTOMY TYPANOSTOMY TUBE PLACEMENT, LEFT performed by Geoffery LyonsSusan J Rossi, [facility]   ??? SKIN BIOPSY  2007    left upper arm-benign Dr Rosie FateHeaphy   ??? TYMPANOSTOMY TUBE PLACEMENT  2006,2011,2016     Family History   Problem Relation Age of Onset   ??? Diabetes Other    ??? Hypertension Other    ??? Mental Illness Other     ??? Heart Disease Other    ??? Other Other         bone and joint problems   ??? COPD Mother    ??? Hypertension Mother    ??? Diabetes Mother    ??? High Cholesterol Mother    ??? Depression Mother    ??? Alcohol Abuse Mother    ??? Hypertension Father    ??? Diabetes Father    ??? High Cholesterol Father    ??? Heart Attack Father      Social History     Tobacco Use   ??? Smoking status: Never Smoker   ??? Smokeless tobacco: Never Used   Substance Use Topics   ??? Alcohol use: No       Subjective:      Review of Systems   Constitutional: Negative for activity change, appetite change, chills, diaphoresis, fatigue, fever and unexpected weight change.   HENT: Negative for congestion, dental problem, ear discharge, ear pain, facial swelling, hearing loss, mouth sores, nosebleeds, postnasal drip, rhinorrhea, sinus pressure, sneezing, sore throat, tinnitus, trouble swallowing and voice change.    Eyes: Negative for visual disturbance.   Respiratory: Negative for apnea, cough, choking, chest tightness, shortness of breath, wheezing and stridor.    Cardiovascular: Negative for chest pain, palpitations and leg swelling.   Gastrointestinal: Negative for abdominal pain, diarrhea, nausea and vomiting.   Endocrine: Negative for cold intolerance, heat intolerance, polydipsia and polyuria.   Genitourinary: Negative for dysuria, enuresis and hematuria.   Musculoskeletal: Negative for arthralgias, gait problem, neck pain and neck stiffness.   Skin: Negative for color change and rash.   Allergic/Immunologic: Negative for environmental allergies, food allergies and immunocompromised state.   Neurological: Negative for dizziness, syncope, facial asymmetry, speech difficulty, light-headedness and headaches.   Hematological: Negative for adenopathy. Does not bruise/bleed easily.   Psychiatric/Behavioral: Negative for confusion and sleep disturbance. The patient is not nervous/anxious.  Objective:   BP 120/80 (Site: Right Upper Arm, Position: Sitting)     Pulse 70    Temp 97.6 ??F (36.4 ??C)    Resp 14    Wt 210 lb (95.3 kg)    BMI 33.39 kg/m??     Physical Exam   Right ear appears normal left ear has a T-tube lying down against the posterior canal wall with a crust around it, binding the tube to the canal wall.      Binocular microscopy:  Left ear is examined under the microscope. Tube is patent. Flanges appear to still be in the middle ear but are lifting up the tympanic membrane from the traction generated by normal epithelial migration in the external auditory canal  The crust is softened with hydrogen peroxide placed just on the crusting.  This is carefully removed with a fine pick in an attempt to save the tube placement.    Data:  All of the past medical history, past surgical history, family history,social history, allergies and current medications were reviewed with the patient.     Assessment & Plan   Diagnoses and all orders for this visit:     Diagnosis Orders   1. Tubotympanic suppurative otitis media, left  PR EAR MICROSCOPY EXAMINATION   2. Status post myringotomy with insertion of tube, left  PR EAR MICROSCOPY EXAMINATION   3. Ventilation tube blocked, subsequent encounter  PR EAR MICROSCOPY EXAMINATION       The findings were explained and her questions were answered.  Suggested a 16-month follow-up and if she has difficulty before that to just call the office.       Otila Back. Melrose Nakayama, MD    **This report has been created using voice recognition software. It may contain minor errors which are inherent in voicerecognition technology.**

## 2018-09-08 ENCOUNTER — Ambulatory Visit: Admit: 2018-09-08 | Discharge: 2018-09-08 | Payer: PRIVATE HEALTH INSURANCE | Attending: Nurse Practitioner

## 2018-09-08 DIAGNOSIS — M47816 Spondylosis without myelopathy or radiculopathy, lumbar region: Secondary | ICD-10-CM

## 2018-09-08 MED ORDER — HYDROCODONE-ACETAMINOPHEN 5-325 MG PO TABS
5-325 MG | ORAL_TABLET | Freq: Four times a day (QID) | ORAL | 0 refills | Status: DC | PRN
Start: 2018-09-08 — End: 2018-12-15

## 2018-09-08 NOTE — Progress Notes (Signed)
[place]  Outpatient progress note    Chief Complaint:   Chief Complaint   Patient presents with   ??? Follow-up     3 mo fu        Subjective: Theresa Vargas is a 63 y.o. female who returns to the office today for further follow up. Low back pain is stable and well controlled. She is taking the Norco which is effective. Was just diagnosed with Type 2 diabetes, on insulin as sugars are high and uncontrolled. Frustrated with this, now trying to lose weight to help.     Medications reviewed. Patient denies side effects with medications. Patient states she is taking medications as prescribed. She denies receiving pain medications from other sources. She denies any ER visits since last visit.    Pain scale with out pain medications or at its worst is 5-6/10.  Pain scale with pain medications or at its best is 3/10.  Last dose of norco was today  Drug screen reviewed from 03/10/2018 and was appropriate  Patient does not have naloxone available at home. Patient has not required use of naloxone at home since last office visit.     Review of Systems:  Review of Systems   Constitutional: Positive for fatigue. Negative for chills and fever.   HENT: Negative for congestion and trouble swallowing.    Eyes: Negative for pain and redness.   Respiratory: Negative for cough and shortness of breath.    Cardiovascular: Negative for chest pain and leg swelling.   Gastrointestinal: Negative for constipation, diarrhea and nausea.   Endocrine: Negative for cold intolerance and heat intolerance.   Genitourinary: Negative for difficulty urinating and frequency.   Musculoskeletal: Positive for back pain and neck pain. Negative for joint swelling.   Skin: Negative for rash.   Neurological: Negative for weakness, light-headedness and numbness.   Hematological: Negative for adenopathy.   Psychiatric/Behavioral: Negative for suicidal ideas. The patient is  nervous/anxious.         Depression       Physical Exam:  BP 130/80    Ht 5' 6.5" (1.689 m)    Wt 210 lb (95.3 kg)    BMI 33.39 kg/m??     awake  Orientation:   person, place, time  Mood: within normal limits  Affect: calm  General appearance: Patient is well nourished, well developed, well groomed and in no acute distress    Memory:   normal,   Attention/Concentration: normal  Language:  normal    Cranial Nerves:  cranial nerves II-XII are grossly intact  ROM:  Decreased range of motion in cervical and lumbar region  Diffuse tenderness throughout cervical and lumbar back.  Motor Exam:  Motor exam is symmetrical 5 out of 5 all extremities bilaterally  Tone:  normal  Muscle bulk: within normal limits  Sensory:  Fluttering sensation on top of right foot at times.   Coordination:   normal  Deep Tendon Reflexes:  Reflexes are intact and symmetrical bilaterally    Skin: warm and dry, no rash or erythema  Peripheral vascular: Pulses: Normal upper and lower extremity pulses; Edema: no      Impression:  ?? Lumbar back pain   ?? Lumbar spondylosis  ?? Left lower limb radiculopathy  ?? Left cervical radiculopathy s/p prior ACDF 2008  ?? Plantar fasciitis  ?? Lumbar laminectomy Oct 2017    Plan:  ?? Continue Norco 5-325 4 times  daily PRN  ?? Encouraged continued physical activity  ?? OARRS reviewed. Current MED: 15  ?? Patient was offered naloxone for home. RX sent 04/04/2017.   ?? Discussed long term side effects of medications, tolerance, dependency and addiction.   ?? Previous UDS reviewed  ?? UDS preformed today for compliance.  ?? Patient told can not receive any pain medications from any other source.  ?? No evidence of abuse, diversion or aberrant behavior.  ??? Medications and/or procedures to improve function and quality of life- patient understanding with this and that may not be pain free  ??? Discussed with patient about safe storage of medications at home  ??? Discussed possible weaning of medication dosing dependent on  treatment/procedure results.     Will continue to monitor any benefits vs side effects of the medications as prescribed.  The patient has been warned about the risk of operating machinery including driving if impaired in any way by these medications.  The patient also accepts the risks of tolerance, dependency, or addiction related to the prescribed medications.  All questions were answered.  Reevaluation as planned, or sooner if requested.     Controlled Substances Monitoring: Periodic Controlled Substance Monitoring: Possible medication side effects, risk of tolerance/dependence & alternative treatments discussed., No signs of potential drug abuse or diversion identified., Random urine drug screen sent today. Marland Kitchen(Rocky MorelBrittany N Jarmal Lewelling, APRN - CNP)    Return in about 3 months (around 12/09/2018).     It was my pleasure to evaluate Theresa Vargas today.  Please call with any concerns or questions.  15 minutes spent in evaluation efforts    Rocky MorelBrittany N Jaxen Samples, APRN - CNP

## 2018-09-13 ENCOUNTER — Encounter: Attending: Family

## 2018-10-30 ENCOUNTER — Inpatient Hospital Stay: Admit: 2018-10-30 | Payer: PRIVATE HEALTH INSURANCE

## 2018-10-30 ENCOUNTER — Encounter

## 2018-10-30 DIAGNOSIS — Z1231 Encounter for screening mammogram for malignant neoplasm of breast: Secondary | ICD-10-CM

## 2018-11-07 ENCOUNTER — Encounter

## 2018-11-07 ENCOUNTER — Inpatient Hospital Stay: Admit: 2018-11-07 | Payer: PRIVATE HEALTH INSURANCE

## 2018-11-07 DIAGNOSIS — R922 Inconclusive mammogram: Secondary | ICD-10-CM

## 2018-12-15 ENCOUNTER — Ambulatory Visit: Admit: 2018-12-15 | Discharge: 2018-12-15 | Payer: PRIVATE HEALTH INSURANCE | Attending: Nurse Practitioner

## 2018-12-15 DIAGNOSIS — M47816 Spondylosis without myelopathy or radiculopathy, lumbar region: Secondary | ICD-10-CM

## 2018-12-15 MED ORDER — HYDROCODONE-ACETAMINOPHEN 5-325 MG PO TABS
5-325 MG | ORAL_TABLET | Freq: Four times a day (QID) | ORAL | 0 refills | Status: AC | PRN
Start: 2018-12-15 — End: 2019-01-14

## 2018-12-15 NOTE — Progress Notes (Signed)
[facility]    Physical Medicine & Rehabilitation  Outpatient progress note    Chief Complaint:   Chief Complaint   Patient presents with   ??? Follow-up     low back pain        Subjective: Theresa Vargas is a 63 y.o. female who returns to the office today for further follow up. Low back pain is stable and well controlled. She is taking the Norco which is effective. Needs refills.     Medications reviewed. Patient denies side effects with medications. Patient states she is taking medications as prescribed. She denies receiving pain medications from other sources. She denies any ER visits since last visit.    Pain scale with out pain medications or at its worst is 8/10.  Pain scale with pain medications or at its best is 3/10.  Last dose of norco was today  Drug screen reviewed from 09/08/2018 and was appropriate  Patient does not have naloxone available at home. Patient has not required use of naloxone at home since last office visit.     Review of Systems:  Review of Systems   Constitutional: Positive for fatigue. Negative for chills and fever.   HENT: Negative for congestion and trouble swallowing.    Eyes: Negative for pain and redness.   Respiratory: Negative for cough and shortness of breath.    Cardiovascular: Negative for chest pain and leg swelling.   Gastrointestinal: Negative for constipation, diarrhea and nausea.   Endocrine: Negative for cold intolerance and heat intolerance.   Genitourinary: Negative for difficulty urinating and frequency.   Musculoskeletal: Positive for back pain and neck pain. Negative for joint swelling.   Skin: Negative for rash.   Neurological: Negative for weakness, light-headedness and numbness.   Hematological: Negative for adenopathy.   Psychiatric/Behavioral: Negative for suicidal ideas. The patient is nervous/anxious.         Depression       Physical Exam:  BP 130/80    Ht 5' 6.5" (1.689 m)    Wt 210 lb (95.3 kg)    BMI 33.39 kg/m??      awake  Orientation:   person, place, time  Mood: within normal limits  Affect: calm  General appearance: Patient is well nourished, well developed, well groomed and in no acute distress    Memory:   normal,   Attention/Concentration: normal  Language:  normal    Cranial Nerves:  cranial nerves II-XII are grossly intact  ROM:  Decreased range of motion in cervical and lumbar region  Diffuse tenderness throughout cervical and lumbar back.  Motor Exam:  Motor exam is symmetrical 5 out of 5 all extremities bilaterally  Tone:  normal  Muscle bulk: within normal limits  Sensory:  Fluttering sensation on top of right foot at times.   Coordination:   normal  Deep Tendon Reflexes:  Reflexes are intact and symmetrical bilaterally    Skin: warm and dry, no rash or erythema  Peripheral vascular: Pulses: Normal upper and lower extremity pulses; Edema: no      Impression:  ?? Lumbar back pain   ?? Lumbar spondylosis  ?? Left lower limb radiculopathy  ?? Left cervical radiculopathy s/p prior ACDF 2008  ?? Plantar fasciitis  ?? Lumbar laminectomy Oct 2017    Plan:  ?? Continue Norco 5-325 4 times daily PRN  ?? Encouraged continued physical activity  ?? OARRS reviewed. Current MED: 15  ?? Patient was offered naloxone for home.  RX sent 04/04/2017.   ?? Discussed long term side effects of medications, tolerance, dependency and addiction.   ?? Previous UDS reviewed  ?? UDS preformed today for compliance.  ?? Patient told can not receive any pain medications from any other source.  ?? No evidence of abuse, diversion or aberrant behavior.  ??? Medications and/or procedures to improve function and quality of life- patient understanding with this and that may not be pain free  ??? Discussed with patient about safe storage of medications at home  ??? Discussed possible weaning of medication dosing dependent on treatment/procedure results.       Will continue to monitor any benefits vs side effects of the medications as prescribed.  The patient has been warned  about the risk of operating machinery including driving if impaired in any way by these medications.  The patient also accepts the risks of tolerance, dependency, or addiction related to the prescribed medications.  All questions were answered.  Reevaluation as planned, or sooner if requested.     Controlled Substances Monitoring: Periodic Controlled Substance Monitoring: Possible medication side effects, risk of tolerance/dependence & alternative treatments discussed., No signs of potential drug abuse or diversion identified., Random urine drug screen sent today. Marland KitchenGlendora Score, APRN - CNP)    Return in about 3 months (around 03/17/2019).     It was my pleasure to evaluate Theresa Vargas today.  Please call with any concerns or questions.  15 minutes spent in evaluation efforts    Glendora Score, APRN - CNP

## 2018-12-19 ENCOUNTER — Encounter: Attending: Family

## 2019-02-07 NOTE — Telephone Encounter (Signed)
From: Kennedy Bucker  To: Rocky Morel, APRN - CNP  Sent: 02/07/2019 2:08 PM EST  Subject: Prescription Question    dr Erma Pinto is prescribing me norco for thumb surgery. i called [facility]. will there be any issues i need to be prepared for to receive the norco.

## 2019-02-08 ENCOUNTER — Encounter: Payer: PRIVATE HEALTH INSURANCE | Attending: Otolaryngology

## 2019-02-08 NOTE — Telephone Encounter (Signed)
Can we find out what surgery she is having? Only Dr. Erma Pinto I know is podiatry.Marland Kitchen

## 2019-02-08 NOTE — Telephone Encounter (Signed)
MyChart message sent reminding her of our pain contract she signed and to bring in Norco for disposal from Dr Erma Pinto to remain compliant as a patient with our office

## 2019-02-08 NOTE — Telephone Encounter (Signed)
From: Kennedy Bucker  To: Robinette Haines Dunifon, APRN - CNP  Sent: 02/08/2019 4:11 PM EST  Subject: Visit Follow-Up Question    crystal. i have a message on my new appt saying i was notified 12-11 too reschedule on jan 7 with dr Malvin Johns but never received a message. so i had to wait 45 minutes to ask receptionist whats taking so long. she let me sit there 45 min to tell me i had another hour and 15 minutes. i have to say i am not very impressed with the receptionist/secretary. just not pleasant and to make me wait. this is the 2nd time i have had issues with them.   i requested to see you, so jan 27 i will.

## 2019-02-08 NOTE — Telephone Encounter (Signed)
[facility] patient

## 2019-02-08 NOTE — Telephone Encounter (Signed)
Spoke via phone with her in regards to procedure with Dr Erma Pinto and narcotic Rx written. She states he removed a nail from her toe and her thumb. Dr Erma Pinto wrote Norco 5/325 1tab every 4 hours for post-op pain #20 tabs. I instructed her not to take his medication until you give instruction on how to proceed. Please advise

## 2019-02-08 NOTE — Telephone Encounter (Signed)
From: Kennedy Bucker  To: Rocky Morel, APRN - CNP  Sent: 02/08/2019 4:06 PM EST  Subject: Visit Follow-Up Question    1. brittany has told me in previous conversations it is okay to get pain meds from other drs and if an issue call her.what changed. ?i gotten pain meds from other doctors and never been an issue.  2 i left a message and i my charted and was told it was okay to get the prescription  3 it is post medication that i need for my surgery pain.   4 no one got back with me for whatever reason so if you dispose of my meds i want reimbursed the money i paid for my pain meds.   5. please dont throw the contact at me when you okayed it and never called me back before i got my prescription. and i have gotten pain meds from drs without a problem.

## 2019-02-28 ENCOUNTER — Ambulatory Visit: Admit: 2019-02-28 | Discharge: 2019-02-28 | Payer: PRIVATE HEALTH INSURANCE | Attending: Family

## 2019-02-28 DIAGNOSIS — Z9622 Myringotomy tube(s) status: Secondary | ICD-10-CM

## 2019-03-23 ENCOUNTER — Ambulatory Visit: Admit: 2019-03-23 | Discharge: 2019-03-23 | Payer: PRIVATE HEALTH INSURANCE | Attending: Nurse Practitioner

## 2019-03-23 DIAGNOSIS — M47816 Spondylosis without myelopathy or radiculopathy, lumbar region: Secondary | ICD-10-CM

## 2019-03-23 MED ORDER — HYDROCODONE-ACETAMINOPHEN 5-325 MG PO TABS
5-325 MG | ORAL_TABLET | Freq: Four times a day (QID) | ORAL | 0 refills | Status: AC | PRN
Start: 2019-03-23 — End: 2019-04-22

## 2019-03-23 MED ORDER — HYDROCODONE-ACETAMINOPHEN 5-325 MG PO TABS
5-325 MG | ORAL_TABLET | Freq: Four times a day (QID) | ORAL | 0 refills | Status: DC | PRN
Start: 2019-03-23 — End: 2019-06-20

## 2019-05-31 LAB — BASIC METABOLIC PANEL
BUN: 12 mg/dL
CO2: 31 mmol/L
Calcium: 9.7 mg/dL
Chloride: 100 mmol/L
Creatinine: 0.7
Gfr Calculated: >60
Glucose: 149 mg/dL
Potassium: 3.9 mmol/L
Sodium: 139 mmol/L

## 2019-05-31 LAB — LIPID PANEL
Cholesterol, Total: 170 mg/dL
HDL: 34 mg/dL — AB (ref 35–70)
LDL Calculated: 78 mg/dL (ref 0–160)
Triglycerides: 290 mg/dL

## 2019-05-31 LAB — AST(SGOT) & ALT(SGPT): ALT: 138 U/L

## 2019-05-31 LAB — HEMOGLOBIN A1C: Hemoglobin A1C: 6.6 %

## 2019-06-01 LAB — HEPATIC FUNCTION PANEL
ALT: 131 U/L — ABNORMAL HIGH (ref 0–31)
AST: 143 U/L — ABNORMAL HIGH (ref 0–41)
Albumin: 4.3 g/dL (ref 3.2–5.3)
Alk Phosphatase: 148 U/L — ABNORMAL HIGH (ref 39–130)
Bilirubin, Direct: 0.1 mg/dL (ref 0.0–0.4)
Total Bilirubin: 0.5 mg/dL (ref 0.3–1.2)
Total Protein: 7.3 g/dL (ref 6.0–8.0)

## 2019-06-04 ENCOUNTER — Encounter

## 2019-06-08 ENCOUNTER — Inpatient Hospital Stay: Admit: 2019-06-08 | Payer: PRIVATE HEALTH INSURANCE

## 2019-06-08 ENCOUNTER — Encounter

## 2019-06-08 DIAGNOSIS — R748 Abnormal levels of other serum enzymes: Secondary | ICD-10-CM

## 2019-06-08 LAB — HEPATIC FUNCTION PANEL
ALT: 29 U/L (ref 0–31)
AST: 25 U/L (ref 0–41)
Albumin: 4.7 g/dL (ref 3.2–5.3)
Alk Phosphatase: 118 U/L (ref 39–130)
Bilirubin, Direct: 0.1 mg/dL (ref 0.0–0.4)
Total Bilirubin: 0.4 mg/dL (ref 0.3–1.2)
Total Protein: 7.8 g/dL (ref 6.0–8.0)

## 2019-06-08 LAB — HEPATITIS C ANTIBODY: Hepatitis C Ab: NONREACTIVE

## 2019-06-08 LAB — HEPATITIS A ANTIBODY, TOTAL: Hep A Total Ab: NONREACTIVE

## 2019-06-08 LAB — CERULOPLASMIN: Ceruloplasmin: 30 mg/dL (ref 18–58)

## 2019-06-08 LAB — HEPATITIS B SURFACE ANTIBODY: HEP B SURF AB: 83.55 m[IU]/mL

## 2019-06-08 LAB — SMOOTH MUSCLE ANTIBODY QUANT: F-ACTIN AB IGG: 5.5 UNITS

## 2019-06-08 LAB — HEPATITIS B E ANTIGEN: Hep B E Ag: NEGATIVE

## 2019-06-08 LAB — ANA: ANA: NEGATIVE

## 2019-06-08 LAB — MITOCHONDRIAL ANTIBODIES, M2: Mitochondrial M2 Ab, IgG: 2.2 UNITS (ref <=20.0)

## 2019-06-11 ENCOUNTER — Ambulatory Visit: Payer: PRIVATE HEALTH INSURANCE

## 2019-06-12 ENCOUNTER — Ambulatory Visit: Admit: 2019-06-12 | Discharge: 2019-06-12 | Payer: PRIVATE HEALTH INSURANCE | Attending: Physician Assistant

## 2019-06-12 DIAGNOSIS — G4733 Obstructive sleep apnea (adult) (pediatric): Secondary | ICD-10-CM

## 2019-06-12 NOTE — Progress Notes (Signed)
[facility]      Theresa Vargas         [id number]  06/12/2019   Chief Complaint   Patient presents with   . Follow-up     OSA 1 year sleep follow up with download         Pt of Dr. Jenetta Downer    PAP Download:   Original or initial AHI: 14.3     Date of initial study: 09/20/2014      Compliant  100%     Noncompliant 0 %     PAP Type Arisense 10 autosetLevel  7   Avg Hrs/Day 9 hr 52 min  AHI: 1.6   Recorded compliance dates , 05/12/2019  to 06/10/2019   Machine/Mfg:   [x]  ResMed    []  Respironics/Dreamstation   Interface:   [x]  Nasal    []  Nasal pillows   []  FFM      Provider:      [x]  [facility]     [] [employer]     []  [employer]    []  [employer]    []  [employer]               []  [facility]      []  [employer]    []  [facility]:      []  Other    Neck Size: 13.5  Mallampati Mallampati 4  ESS: 0   SAQLI: 44    Here is a scan of the most recent download:            Presentation:   Theresa Vargas presents for sleep medicine follow up for obstructive sleep apnea, insomnia  Since the last visit, Theresa Vargas is doing well with PAP. She is sleeping well with Ambien from psych.  She cannot sleep without it.  She is following for depression with psych and psychology.      Equipment issues:  The pressure is  acceptable, the mask is acceptable     Sleep issues:  Do you feel better? Yes  More rested?Yes   Better concentration? no    Progress History:   Since last visit any new medical issues? Yes Theresa Vargas   New ER or hospital visits? yes  Any new or changes in medicines? No  Any new sleep medicines? No    Review of Systems -   Review of Systems   Constitutional: Negative for activity change, appetite change, chills and fever.   HENT: Negative for congestion and postnasal drip.    Eyes: Negative.    Respiratory: Negative for cough, chest tightness, shortness of breath, wheezing and stridor.    Cardiovascular: Negative for chest pain and leg swelling.   Gastrointestinal: Negative for diarrhea and nausea.   Endocrine:  Negative.    Genitourinary: Negative.    Musculoskeletal: Negative.  Negative for arthralgias and back pain.   Skin: Negative.    Allergic/Immunologic: Negative.    Neurological: Negative.  Negative for dizziness and light-headedness.   Psychiatric/Behavioral: Negative.    All other systems reviewed and are negative.       Physical Exam:    BMI:  Body mass index is 33.07 kg/m.    Wt Readings from Last 3 Encounters:   06/12/19 208 lb (94.3 kg)   03/23/19 202 lb (91.6 kg)   02/28/19 202 lb 6.4 oz (91.8 kg)     Weight stable / unchanged  Vitals: BP 130/68 (Site: Right Upper Arm, Position: Sitting, Cuff Size: Large Adult)  Pulse 89   Temp 97 F (36.1 C) (Temporal)   Ht 5' 6.5" (1.689 m)   Wt 208 lb (94.3 kg)   SpO2 99% Comment: Room air at rest  BMI 33.07 kg/m       Physical Exam  Constitutional:       Appearance: She is well-developed.   HENT:      Head: Normocephalic and atraumatic.      Right Ear: External ear normal.      Left Ear: External ear normal.   Eyes:      Conjunctiva/sclera: Conjunctivae normal.      Pupils: Pupils are equal, round, and reactive to light.   Neck:      Musculoskeletal: Normal range of motion and neck supple.   Cardiovascular:      Rate and Rhythm: Normal rate and regular rhythm.      Heart sounds: Normal heart sounds.   Pulmonary:      Effort: Pulmonary effort is normal.      Breath sounds: Normal breath sounds.   Musculoskeletal: Normal range of motion.   Skin:     General: Skin is warm and dry.   Neurological:      Mental Status: She is alert and oriented to person, place, and time.   Psychiatric:         Behavior: Behavior normal.         Thought Content: Thought content normal.         Judgment: Judgment normal.           ASSESSMENT/DIAGNOSIS     Diagnosis Orders   1. OSA on CPAP     2. Obesity (BMI 30-39.9)              Plan   Do you need any equipment today? Yes update supplies  - Download reviewed and discussed with patient  - She  was advised to continue current positive  airway pressure therapy with above described pressure.   - She  advised to keep good compliance with current recommended pressure to get optimal results and clinical improvement  - Recommend 7-9 hours of sleep with PAP  - She was advised to call DME company regarding supplies if needed.   -She call my office for earlier appointment if needed for worsening of sleep symptoms.   - She was instructed on weight loss  - Theresa Vargas was educated about my impression and plan. Patient verbalizesunderstanding.  We will see Theresa Vargas back in: 1 year with download    Information added by my medical assistant/LPN was reviewed today        Jamie Brookes PA-C, MPAS  06/12/2019

## 2019-06-20 ENCOUNTER — Ambulatory Visit: Admit: 2019-06-20 | Discharge: 2019-06-20 | Payer: PRIVATE HEALTH INSURANCE | Attending: Family

## 2019-06-20 DIAGNOSIS — M961 Postlaminectomy syndrome, not elsewhere classified: Secondary | ICD-10-CM

## 2019-06-20 MED ORDER — HYDROCODONE-ACETAMINOPHEN 5-325 MG PO TABS
5-325 MG | ORAL_TABLET | Freq: Four times a day (QID) | ORAL | 0 refills | Status: DC | PRN
Start: 2019-06-20 — End: 2019-07-17

## 2019-06-28 ENCOUNTER — Ambulatory Visit: Admit: 2019-06-28 | Discharge: 2019-06-28 | Payer: PRIVATE HEALTH INSURANCE | Attending: "Nutrition

## 2019-06-28 DIAGNOSIS — E1169 Type 2 diabetes mellitus with other specified complication: Secondary | ICD-10-CM

## 2019-07-12 ENCOUNTER — Inpatient Hospital Stay: Payer: PRIVATE HEALTH INSURANCE

## 2019-07-12 ENCOUNTER — Ambulatory Visit: Admit: 2019-07-12 | Payer: PRIVATE HEALTH INSURANCE

## 2019-07-12 LAB — POCT GLUCOSE: POC Glucose: 154 mg/dl — ABNORMAL HIGH (ref 70–108)

## 2019-07-12 MED ORDER — LIDOCAINE HCL (PF) 2 % IJ SOLN
2 | INTRAMUSCULAR | Status: AC
Start: 2019-07-12 — End: 2019-07-12

## 2019-07-12 MED ORDER — PROPOFOL 200 MG/20ML IV EMUL
200 | INTRAVENOUS | Status: DC | PRN
Start: 2019-07-12 — End: 2019-07-12
  Administered 2019-07-12: 12:00:00 100 via INTRAVENOUS

## 2019-07-12 MED ORDER — IOHEXOL 300 MG/ML IJ SOLN
300 MG/ML | INTRAMUSCULAR | Status: DC | PRN
Start: 2019-07-12 — End: 2019-07-12
  Administered 2019-07-12: 12:00:00 .5

## 2019-07-12 MED ORDER — METHYLPREDNISOLONE ACETATE 80 MG/ML IJ SUSP
80 MG/ML | INTRAMUSCULAR | Status: DC | PRN
Start: 2019-07-12 — End: 2019-07-12
  Administered 2019-07-12: 12:00:00 80

## 2019-07-12 MED ORDER — LIDOCAINE HCL 1 % IJ SOLN
1 % | INTRAMUSCULAR | Status: DC | PRN
Start: 2019-07-12 — End: 2019-07-12
  Administered 2019-07-12: 12:00:00 5

## 2019-07-12 MED ORDER — PROPOFOL 200 MG/20ML IV EMUL
200 | INTRAVENOUS | Status: AC
Start: 2019-07-12 — End: 2019-07-12

## 2019-07-12 MED ORDER — BUPIVACAINE HCL (PF) 0.25 % IJ SOLN
0.25 % | INTRAMUSCULAR | Status: DC | PRN
Start: 2019-07-12 — End: 2019-07-12
  Administered 2019-07-12: 12:00:00 2

## 2019-07-12 MED ORDER — LIDOCAINE HCL (PF) 2 % IJ SOLN
2 % | INTRAMUSCULAR | Status: DC | PRN
Start: 2019-07-12 — End: 2019-07-12
  Administered 2019-07-12: 12:00:00 100 via INTRAVENOUS

## 2019-07-12 MED FILL — PROPOFOL 200 MG/20ML IV EMUL: 200 MG/20ML | INTRAVENOUS | Qty: 20

## 2019-07-12 MED FILL — XYLOCAINE-MPF 2 % IJ SOLN: 2 % | INTRAMUSCULAR | Qty: 5

## 2019-07-12 NOTE — Anesthesia Pre-Procedure Evaluation (Signed)
Department of Anesthesiology  Preprocedure Note       Name:  Theresa Vargas   Age:  64 y.o.  DOB:  03-02-1955                                          MRN:  [id number]         Date:  07/12/2019      Surgeon: Juliann Mule):  Laqueta Carina, MD    Procedure: Procedure(s):  bilateral SI MBB # 1    Medications prior to admission:   Prior to Admission medications    Medication Sig Start Date End Date Taking? Authorizing Provider   FLUoxetine (PROZAC) 40 MG capsule Take 40 mg by mouth daily   Yes Historical Provider, MD   HYDROcodone-acetaminophen (NORCO) 5-325 MG per tablet Take 1 tablet by mouth every 6 hours as needed for Pain for up to 30 days. 06/20/19 07/20/19 Yes Reece Leader Marzec, APRN - CNP   NOVOLOG FLEXPEN 100 UNIT/ML injection pen  05/09/18  Yes Historical Provider, MD   pravastatin (PRAVACHOL) 20 MG tablet  07/05/18  Yes Historical Provider, MD   Insulin Glargine (LANTUS SC) Inject into the skin 10 units daily increase by 1 unit daily until BS reach 150 goal   Yes Historical Provider, MD   diltiazem (TIAZAC) 300 MG extended release capsule TAKE 1 CAPSULE BY MOUTH ONE TIME A DAY 09/12/17  Yes Historical Provider, MD   hyoscyamine (LEVSIN/SL) 125 MCG sublingual tablet DISSOLVE 1 TABLET UNDER TONGUE EVERY FOUR HOURS AS NEEDED 08/31/17  Yes Historical Provider, MD   CPAP Machine MISC by Does not apply route Please check patient's PAP machine for proper functioning by DME Company. 08/01/17  Yes Zena Amos, PA-C   losartan-hydrochlorothiazide (HYZAAR) 100-25 MG per tablet TAKE 1 TABLET BY MOUTH ONE TIME A DAY 05/31/15  Yes Historical Provider, MD   zolpidem (AMBIEN) 10 MG tablet Take 10 mg by mouth nightly Takes every night   Yes Historical Provider, MD   lorazepam (ATIVAN) 1 MG tablet Take 1 mg by mouth every 6 hours as needed.     Yes Historical Provider, MD   Coenzyme Q10 (COQ-10 PO) Take 100 mg by mouth daily    Historical Provider, MD   Semaglutide (OZEMPIC, 0.25 OR 0.5 MG/DOSE, SC) Inject 0.5 mg into the skin  once a week    Historical Provider, MD   metFORMIN (GLUCOPHAGE) 500 MG tablet Take 500 mg by mouth 2 times daily (with meals)  Patient not taking: Reported on 06/28/2019    Historical Provider, MD   [place]. 05/31/15   Historical Provider, MD       Current medications:    No current facility-administered medications for this encounter.       Allergies:  No Known Allergies    Problem List:    Patient Active Problem List   Diagnosis Code   ??? OM (otitis media) H66.90   ??? ETD (eustachian tube dysfunction) H69.80   ??? Dizziness and giddiness R42   ??? Anal skin tag K64.4   ??? Tinnitus of both ears H93.13   ??? Chronic sinusitis J32.9   ??? Acute URI J06.9   ??? Hypertrophy of nasal turbinates J34.3   ??? Nasal septal deviation J34.2   ??? Status post myringotomy with insertion of tube Z96.22   ???  Eustachian tube dysfunction H69.80   ??? Otitis media H66.90   ??? Heart palpitations R00.2   ??? Essential hypertension I10   ??? OSA on CPAP G47.33, Z99.89   ??? Obesity (BMI 30-39.9) E66.9       Past Medical History:        Diagnosis Date   ??? Depression    ??? 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 H. pylori infection 05/03/2018    stool   ??? Hyperlipidemia    ??? Hypertension    ??? Nausea & vomiting    ??? Panic disorder    ??? PONV (postoperative nausea and vomiting)    ??? Psychiatric problem    ??? Sleep apnea     on cpap       Past Surgical History:        Procedure Laterality Date   ??? BACK SURGERY  [place]   ??? CARDIOVASCULAR STRESS TEST  2018   ??? CARDIOVASCULAR STRESS TEST  2016   ??? CHOLECYSTECTOMY  2007    Dr Christean Leaf   ??? COLONOSCOPY      2006-Dr Neidich   ??? HEMORRHOID SURGERY  02/2011    Dr Lincoln Brigham skin tag removal   ??? HYSTERECTOMY  2007    Dr Louis Meckel   ??? NECK SURGERY  2008 [facility]    ACDF   ??? NERVE BLOCK  10/29/2014    LUMBAR FACET INJECTION   ??? OTHER SURGICAL HISTORY  11/05/2010    hemmoridectomy-Dr Olt   ??? OTHER SURGICAL HISTORY Bilateral 01/06/2015    Lumbar Facet   ??? OTHER  SURGICAL HISTORY  09/2010    hemorrhoid banding-Dr Neidich   ??? OTHER SURGICAL HISTORY  09/2010    hemorrhoid banding-Dr Neidich   ??? PR CREATE EARDRUM OPENING,GEN ANESTH Left 07/19/2016    MYRINGOTOMY TYPANOSTOMY TUBE PLACEMENT, LEFT performed by Eldridge Abrahams, MD at [facility]   ??? SKIN BIOPSY  2007    left upper arm-benign Dr Kenna Gilbert   ??? TYMPANOSTOMY TUBE PLACEMENT  2006,2011,2016       Social History:    Social History     Tobacco Use   ??? Smoking status: Never Smoker   ??? Smokeless tobacco: Never Used   Substance Use Topics   ??? Alcohol use: No                                Counseling given: Not Answered      Vital Signs (Current):   Vitals:    07/12/19 0711   BP: (!) 129/58   Pulse: 75   Resp: 16   Temp: 96.8 ??F (36 ??C)   TempSrc: Skin   SpO2: 95%   Weight: 205 lb 3.2 oz (93.1 kg)   Height: 5' 6.5" (1.689 m)                                              BP Readings from Last 3 Encounters:   07/12/19 (!) 129/58   06/20/19 128/78   06/12/19 130/68       NPO Status: Time of last liquid consumption: 0600 (sip with med)  Time of last solid consumption: 2200                        Date of last liquid consumption: 07/12/19                        Date of last solid food consumption: 07/11/19    BMI:   Wt Readings from Last 3 Encounters:   07/12/19 205 lb 3.2 oz (93.1 kg)   06/28/19 206 lb 3.2 oz (93.5 kg)   06/20/19 208 lb (94.3 kg)     Body mass index is 32.62 kg/m??.    CBC:   Lab Results   Component Value Date    WBC 6.0 06/06/2018    RBC 4.80 06/06/2018    RBC 5.09 07/16/2016    HGB 13.9 06/06/2018    HCT 41.9 06/06/2018    MCV 87.3 06/06/2018    RDW 12.9 07/16/2016    PLT 381 06/06/2018       CMP:   Lab Results   Component Value Date    NA 139 05/31/2019    K 3.9 05/31/2019    K 3.5 04/26/2018    CL 100 05/31/2019    CO2 31 05/31/2019    BUN 12 05/31/2019    CREATININE 0.70 05/31/2019    LABGLOM >60 05/31/2019    LABGLOM 85 07/04/2018    GLUCOSE 149 05/31/2019    GLUCOSE 169 07/15/2016     PROT 7.8 06/07/2019    CALCIUM 9.7 05/31/2019    BILITOT 0.4 06/07/2019    ALKPHOS 118 06/07/2019    ALKPHOS 122 06/06/2018    AST 25 06/07/2019    ALT 29 06/07/2019       POC Tests: No results for input(s): POCGLU, POCNA, POCK, POCCL, POCBUN, POCHEMO, POCHCT in the last 72 hours.    Coags:   Lab Results   Component Value Date    INR 0.93 11/13/2015    APTT 31.1 11/13/2015       HCG (If Applicable): No results found for: PREGTESTUR, PREGSERUM, HCG, HCGQUANT     ABGs: No results found for: PHART, PO2ART, PCO2ART, HCO3ART, BEART, O2SATART     Type & Screen (If Applicable):  No results found for: LABABO, LABRH    Drug/Infectious Status (If Applicable):  Lab Results   Component Value Date    HEPCAB Non-Reactive 06/07/2019       COVID-19 Screening (If Applicable): No results found for: COVID19        Anesthesia Evaluation     history of anesthetic complications: PONV.  Airway: Mallampati: II        Dental:          Pulmonary:   (+) sleep apnea:                             Cardiovascular:    (+) hypertension:,                   Neuro/Psych:               GI/Hepatic/Renal:             Endo/Other:    (+) Diabetes, .                 Abdominal:           Vascular:  Anesthesia Plan      MAC     ASA 2             Anesthetic plan and risks discussed with patient.      Plan discussed with CRNA.                  Gala Romney, MD   07/12/2019

## 2019-07-12 NOTE — H&P (Signed)
H&P     This is a patient of Theresa Vargas with physiatry and is new to me today. Has a main complaint of pain that starts in low back radiating into buttocks and throughout legs bilaterally to feet all the way down. Pain is constant and in low back is sharp pain and aching and in legs "just hurt" aching.   Pain is more bothersome in back   Had facet MBB in past without relief and 3 LESI in past and lumbar laminectomy in 2017. Pain remains the same   ??  On Norco 5/325 QID prn- helps take edge off   Medications reviewed. Patient denies side effects with medications. Patient states she is taking medications as prescribed. Shedenies receiving pain medications from other sources. She denies any ER visits since last visit.  ??  Pain scale with out pain medications or at its worst is 10/10.  Pain scale with pain medications or at its best is 5/10.  Last dose of Norco was today  Drug screen reviewed from 03/23/2019 and was appropriate  Pill count was not completed today and she was instructed to bring at next appointment   Patient does not have naloxone available at home. Patient has not required use of naloxone at home since last office visit.   ??  ??  The patienthas No Known Allergies.  ??  ??  Subjective:      Review of Systems   Constitutional: Positive for appetite change.   HENT: Negative.    Eyes: Positive for visual disturbance.        Wears corrective glasses    Respiratory: Negative.         CPAP at night    Cardiovascular: Negative.    Gastrointestinal: Negative.    Endocrine: Negative.    Genitourinary: Negative.    Musculoskeletal: Positive for arthralgias, back pain and myalgias. Negative for gait problem.        Ambulating without assist devices    Neurological: Positive for weakness and numbness.   Psychiatric/Behavioral: Negative.    ??  ??  Objective:   ??  Vitals       Vitals:   ?? 06/20/19 1005   BP: 128/78   Weight: 208 lb (94.3 kg)   Height: 5' 6.5" (1.689 m)      ??  ??  Physical Exam  Constitutional:       General:  She is not in acute distress.     Appearance: She is well-developed.   HENT:      Head: Normocephalic and atraumatic.      Right Ear: External ear normal.      Left Ear: External ear normal.      Nose: Nose normal.   Eyes:      Conjunctiva/sclera: Conjunctivae normal.      Pupils: Pupils are equal, round, and reactive to light.   Neck:      Thyroid: No thyromegaly.      Comments: Decreased range of motion with ear to shoulder both sides, tender posterior over bone   Cardiovascular:      Rate and Rhythm: Normal rate and regular rhythm.      Heart sounds: No murmur heard.   No friction rub. No gallop.    Pulmonary:      Effort: Pulmonary effort is normal. No respiratory distress.      Breath sounds: Normal breath sounds.   Abdominal:      General: Bowel sounds are normal.  Palpations: Abdomen is soft.   Musculoskeletal:         General: Tenderness present. No deformity.      Right shoulder: No tenderness. Normal range of motion.      Left shoulder: No tenderness. Normal range of motion.      Left upper arm: No tenderness or bony tenderness.      Left elbow: Normal range of motion.      Left forearm: No tenderness or bony tenderness.      Cervical back: Neck supple. No tenderness.      Thoracic back: No tenderness. Normal range of motion.      Lumbar back: Tenderness and bony tenderness present. Decreased range of motion.        Back:       Right knee: Normal range of motion. No tenderness.      Left knee: Normal range of motion. No tenderness.   Skin:     General: Skin is warm and dry.      Findings: No erythema or rash.          Neurological:      Mental Status: She is alert and oriented to person, place, and time.      Sensory: No sensory deficit.      Motor: Weakness present. No atrophy or abnormal muscle tone.      Coordination: Coordination abnormal.      Gait: Gait normal.      Deep Tendon Reflexes: Reflexes are normal and symmetric.      Reflex Scores:       Tricep reflexes are 2+ on the right side and 2+  on the left side.       Bicep reflexes are 2+ on the right side and 2+ on the left side.       Brachioradialis reflexes are 2+ on the right side and 2+ on the left side.       Patellar reflexes are 2+ on the right side and 2+ on the left side.       Achilles reflexes are 2+ on the right side and 2+ on the left side.     Comments: Bilateral lower and right upper extremity 5/5.     + bilateral SLR at 60 degrees        Psychiatric:         Behavior: Behavior normal.         Thought Content: Thought content normal.   ??  ??  FABER test: + bilaterally   Yeomans test: + bilaterally  Gaenslen test: + bilaterally   Assessment:   ??  1. Lumbar post-laminectomy syndrome    2. 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 Chronic pain syndrome    5. Spinal stenosis of lumbar region with neurogenic claudication    6. SI (sacroiliac) pain    7. Other chronic pain    ??  Plan:      ?? OARRS reviewed. Current MED: 20.00  ?? Patient was offered naloxone for home.   ?? Discussed long term side effects of medications, tolerance, dependency and addiction.  ?? Previous UDS reviewed  ?? UDS preformed today for compliance.  ?? Patient told can not receive any pain medications from any other source.  ?? No evidence of abuse, diversion or aberrant behavior.  ?? Medications and/or procedures to improve function and quality of life- patient understanding with this and that may not be  pain free  ?? Discussed with patient about safe storage of medications at home  ?? Discussed possible weaning of medication dosing dependent on treatment/procedure results.   ?? Discussed with patient about risks with procedure including infection, reaction to medication, increased pain, or bleeding.  ?? Reviewed Brittany's note with physiatry  ?? Reviwed previous procedure notes no relief from multiple LESI and L-facet MBB   ?? Plan bilateral SI MBB # 1 for diagnostic and therapeutic relief. Procedure and risks discussed in  detail with patient.   ?? Discussed possible SCS would need updated imaging as last lumbar MRI is from 2017- reviewed.   ?? Continue Norco 5/325 QID prn- ordered refill   ?? Updated UDS ordered today   ??  ??    ??  ??  Return for bilateral SI MBB # 1. , follow up after procedure.  ??  ??

## 2019-07-12 NOTE — Op Note (Signed)
Operative Note    Pre-Procedure Note    Patient Name: Theresa Vargas   Date of Birth:1955/11/08  Medical Record Number: [id number]  Date: 07/12/19     Indication:  SI pain  Consent: On file.    Vital Signs:   Vitals:    07/12/19 0711   BP: (!) 129/58   Pulse: 75   Resp: 16   Temp: 96.8 ??F (36 ??C)   SpO2: 95%       Past Medical History:   has a past medical history of Depression, Diabetes mellitus ([place]), H. pylori infection, Hyperlipidemia, Hypertension, Nausea & vomiting, Panic disorder, PONV (postoperative nausea and vomiting), Psychiatric problem, and Sleep apnea.    Past Surgical History:   has a past surgical history that includes Cholecystectomy (2007); Hysterectomy (2007); Neck surgery (2008 [facility]); Tympanostomy tube placement (2006,2011,2016); skin biopsy (2007); Colonoscopy; Hemorrhoid surgery (02/2011); other surgical history (11/05/2010); Nerve Block (10/29/2014); other surgical history (Bilateral, 01/06/2015); back surgery (11/28/2015); pr create eardrum opening,gen anesth (Left, 07/19/2016); other surgical history (09/2010); other surgical history (09/2010); cardiovascular stress test (2018); and cardiovascular stress test (2016).    Pre-Sedation Documentation and Exam:   Vital signs have been reviewed (see flow sheet for vitals).     Sedation/ Anesthesia Plan: MAC    Patient is an appropriate candidate for plan of sedation: yes    Preoperative Diagnosis:  Bilateral sacroiliitis.    Postoperative Diagnosis: Bilateral  Sacroiliitis.    Procedure Performed:  Bilateral sacroiliac joint injection under fluoroscopy guidance # 1.      Indication for the Procedure:  The patient failed conservative management  for pain in low back.  The patient is tender over the Bilateral SI joint.  Patrick's test is positive on the Bilateral side.  As patient is not responding to conservative management and pain is interfering with activities of daily living we decided to proceed with SI joint injection. The procedure  and risks were discussed with the patient and an informed consent was obtained.    Procedure:     A meaningful communication was kept up with the patient throughout the procedure.  The patient is placed in prone position.  Skin over the back was prepped and draped in sterile manner.  Then using fluoroscopy the Bilateral sacroiliac joint was identified. Then the angle of the fluoroscopy was adjusted such that the view of the caudal aspect of the joint space was optimized.  Then skin and deep tissues over the caudal aspect of the joint were infiltrated with 5 ml of 1% lidocaine. The #22-gauge, 3-1/2 inch spinal needle was introduced through the skin wheal and directed such that the tip of the needle lies in the joint space. This was confirmed by injecting 0.5 ml of Omnipaque-180 through the needle in divided doses  and observing the spread of the contrast along the joint space. Then after negative aspiration a total of 80 mg of depomedrol with 2 ml of  0.25% Marcaine was injected through the needle in divided doses. The needle is removed and a Band-Aid was placed over the needle insertion site.  EBL-0  The patient tolerated the procedure well and vital signs remained stable.  The patient was discharged home in stable condition and will be followed in the pain clinic in the next few weeks or further planning.    Electronically signed by Laqueta Carina, MD on 07/12/19 at 8:20 AM EDT

## 2019-07-12 NOTE — Progress Notes (Signed)
0821-Patient to Phase II via cart. Report received from [facility]. Patient drowsy but responsive.Vitals obtained and stable. Respirations even and unlabored on room air. Patient denies pain, nausea, numbness and tingling. Patient able to move all extremities. No drainage noted at injection sites. Patient instructed to stay in bed. Instructed on call light use.  0825-Patient provided with snack and drinks. Denies needs.  0840-Patient tolerated snack and drink. IV removed with no complications. Patient getting dressed at bedside.   0844-Patient ride notified of pickup.  0849-Patient discharged in stable condition with responsible driver. All belongings given to patient. Patient ambulated to car with assistance from RN. Patient tolerated well.

## 2019-07-12 NOTE — Discharge Instructions (Signed)
Call office [phone] if you have:   Temperature greater than 100.4   Persistent nausea and vomiting   Severe uncontrolled pain   Redness, tenderness, or signs of infection (pain, swelling, redness, odor or green/yellow discharge around the site)   Difficulty breathing, headache or visual disturbances   Hives   Persistent dizziness or light-headedness   Extreme fatigue   Any other questions or concerns you may have after discharge    In an emergency, call 911 or go to an Emergency Department at a nearby hospital    It is important to bring a complete, current list of your medications to any medical appointments or hospitalizations.    REMINDER:    Carry a list of your medications and allergies with you at all times   Call your pharmacy at least 1 week in advance to refill prescriptions    Diet: Resume your usual diet. Good nutrition promotes healing. Increase fluid intake.     Activity: Rest for 24 hrs then resume normal activity.        Education Materials Received: {yes/no:310449}  Belongings Returned: {yes/no:310449}          I understand and acknowledge receipt of the above instructions.                                                                                                                                          Patient or Guardian Signature                                                         Date/Time                                                                                                                                            Physician's or R.N.'s Signature                                                                    Date/Time      The discharge instructions have been reviewed with the patient and/or Guardian.  Patient and/or Guardian signed and retained a printed copy.    Call office [phone] if you have:   Temperature greater than 100.4   Persistent nausea and vomiting   Severe uncontrolled pain   Redness, tenderness, or signs of infection (pain, swelling,  redness, odor or green/yellow discharge around the site)   Difficulty breathing, headache or visual disturbances   Hives   Persistent dizziness or light-headedness   Extreme fatigue   Any other questions or concerns you may have after discharge    In an emergency, call 911 or go to an Emergency Department at a nearby hospital    It is important to bring a complete, current list of your medications to any medical appointments or hospitalizations.    REMINDER:    Carry a list of your medications and allergies with you at all times   Call your pharmacy at least 1 week in advance to refill prescriptions    Diet: Resume your usual diet. Good nutrition promotes healing. Increase fluid intake.     Activity: Rest for 24 hrs then resume normal activity.        Education Materials Received: {yes/no:310449}  Belongings Returned: {yes/no:310449}          I understand and acknowledge receipt of the above instructions.                                                                                                                                          Patient or Guardian Signature                                                         Date/Time                                                                                                                                            Physician's or R.N.'s Signature                                                                    Date/Time      The discharge instructions have been reviewed with the patient and/or Guardian.  Patient and/or Guardian signed and retained a printed copy.  ANESTHESIA INSTRUCTIONS FOLLOWING SURGERY         Since you may experience some intermittent light-headedness for the next several hours, we suggest you plan on bed rest or quiet relaxation this evening. You must have a friend or relative stay with you tonight.     Because of the sedation you have received, it is recommended that you do not drive a motor vehicle, operate any kind of  machinery, or sign any contractual agreement for 24 hours following the procedure.    . You should not take alcoholic beverages tonight and only take sleeping medication that has been specifically prescribed for you by your physician.  . Call office [phone] if you have:  . Temperature greater than 100.4  . Persistent nausea and vomiting  . Severe uncontrolled pain  . Redness, tenderness, or signs of infection (pain, swelling, redness, odor or green/yellow discharge around the site)  . Difficulty breathing, headache or visual disturbances  . Hives  . Persistent dizziness or light-headedness  . Extreme fatigue  . Any other questions or concerns you may have after discharge  .   . In an emergency, call 911 or go to an Emergency Department at a nearby hospital  .   . It is important to bring a complete, current list of your medications to any medical appointments or hospitalizations.  .   . REMINDER:   . Carry a list of your medications and allergies with you at all times  . Call your pharmacy at least 1 week in advance to refill prescriptions  .   . Diet: Resume your usual diet. Good nutrition promotes healing. Increase fluid intake.   .   . Activity: Rest for 24 hrs then resume normal activity.  .   .   .   . Education Materials Received: {yes/no:310449}  . Belongings Returned: {yes/no:310449}  .   .   .   . HOME MEDICATIONS:  . .    If on blood thinning products such as; Aspirin, NSAIDS, Plavix, Coumadin, Xarelto, Fish Oil, Multi-Vitamins or Herbal Supplements restart in 24 hours  . .    Restart Metformin in 48 hours if you had procedure with dye.  . .    Restart Metformin in 24 hours if no dye used during procedure.  .   .   . The discharge instructions have been reviewed with the patient and/or Guardian.  Patient and/or Guardian signed and retained a printed copy.

## 2019-07-12 NOTE — Anesthesia Post-Procedure Evaluation (Signed)
Department of Anesthesiology  Postprocedure Note    Patient: Theresa Vargas  MRN: [id number]  Birthdate: March 25, 1955  Date of evaluation: 07/12/2019  Time:  9:00 AM     Procedure Summary     Date: 07/12/19 Room / Location: [facility] / [facility]    Anesthesia Start: [phone] Anesthesia Stop: 0821    Procedure: bilateral SI MBB # 1 (Bilateral ) Diagnosis: (SI Pain (M53.3))    Surgeons: Laqueta Carina, MD Responsible Provider: Gala Romney, MD    Anesthesia Type: MAC ASA Status: 2          Anesthesia Type: MAC    Aldrete Phase I:      Aldrete Phase II: Aldrete Score: 9    Last vitals: Reviewed and per EMR flowsheets.       Anesthesia Post Evaluation    Patient location during evaluation: PACU  Patient participation: complete - patient participated  Level of consciousness: awake and alert  Pain score: 0  Airway patency: patent  Nausea & Vomiting: no nausea and no vomiting  Complications: no  Cardiovascular status: blood pressure returned to baseline  Respiratory status: acceptable  Hydration status: stable

## 2019-07-12 NOTE — H&P (Signed)
[facility]  History and Physical Update    Pt Name: Theresa Vargas  MRN: [id number]  Birthdate: 31-Jan-1956  Date of evaluation: 07/12/2019      I have examined the patient and reviewed the H&P/Consult and there are no changes to the patient or plans.        Electronically signed by Halina Andreas, MD on 07/12/2019 at 7:56 AM

## 2019-07-17 ENCOUNTER — Encounter

## 2019-07-17 MED ORDER — HYDROCODONE-ACETAMINOPHEN 5-325 MG PO TABS
5-325 MG | ORAL_TABLET | Freq: Four times a day (QID) | ORAL | 0 refills | Status: DC | PRN
Start: 2019-07-17 — End: 2019-08-16

## 2019-07-17 NOTE — Telephone Encounter (Signed)
OARRS reviewed. UDS: + for Zolpidem,Dihydrocodeine. Hydrocodone, Norhydrocodone, Hydromorphone, Lorazepam, Zolpidem-Carboxyl.   Last seen: 06/20/2019. Follow-up:   Future Appointments   Date Time Provider Department Center   08/01/2019  7:30 AM Simone Curia, APRN - CNP N [facility] Pain [facility] [place]   08/20/2019  1:00 PM Lorenza Burton, RD, LD [facility] Physic [facility] [place]   09/04/2019  8:00 AM Eugenie Norrie, APRN - CNP N ENT [facility] [place]   06/11/2020  1:45 PM Shauna Hugh, PA-C [facility] [place]

## 2019-07-17 NOTE — Telephone Encounter (Signed)
Kennedy Bucker called requesting a refill on the following medications:  Requested Prescriptions     Pending Prescriptions Disp Refills   ??? HYDROcodone-acetaminophen (NORCO) 5-325 MG per tablet 120 tablet 0     Sig: Take 1 tablet by mouth every 6 hours as needed for Pain for up to 30 days.     Pharmacy verified:  [facility]    Date of last visit: 06/20/2019  Date of next visit (if applicable): 08/01/2019

## 2019-08-01 ENCOUNTER — Ambulatory Visit: Admit: 2019-08-01 | Discharge: 2019-08-01 | Payer: PRIVATE HEALTH INSURANCE | Attending: Family

## 2019-08-01 DIAGNOSIS — M961 Postlaminectomy syndrome, not elsewhere classified: Secondary | ICD-10-CM

## 2019-08-16 ENCOUNTER — Encounter

## 2019-08-17 MED ORDER — HYDROCODONE-ACETAMINOPHEN 5-325 MG PO TABS
5-325 MG | ORAL_TABLET | Freq: Four times a day (QID) | ORAL | 0 refills | Status: DC | PRN
Start: 2019-08-17 — End: 2019-09-13

## 2019-08-17 NOTE — Telephone Encounter (Signed)
OARRS reviewed. UDS: + for Zolpidem, Dihydrocodeine, Hydrocodone, Norhydrocodone, Hydromorphone, Lorazepam, Zolpidem-Carboxyl.   Last seen: 08/01/2019. Follow-up: 11/01/19

## 2019-08-20 ENCOUNTER — Encounter: Attending: "Nutrition

## 2019-08-29 ENCOUNTER — Encounter: Attending: Family

## 2019-09-04 ENCOUNTER — Ambulatory Visit: Admit: 2019-09-04 | Discharge: 2019-09-04 | Attending: Family

## 2019-09-04 DIAGNOSIS — Z9622 Myringotomy tube(s) status: Secondary | ICD-10-CM

## 2019-09-13 ENCOUNTER — Encounter

## 2019-09-13 MED ORDER — HYDROCODONE-ACETAMINOPHEN 5-325 MG PO TABS
5-325 MG | ORAL_TABLET | Freq: Four times a day (QID) | ORAL | 0 refills | Status: DC | PRN
Start: 2019-09-13 — End: 2019-10-15

## 2019-09-13 NOTE — Telephone Encounter (Signed)
OARRS reviewed. UDS: + for Zolpidem, Hydrocodone, Lorazepam, Zolpidem-Carboxyl.   Last seen: 08/01/2019. Follow-up:   Future Appointments   Date Time Provider Department Center   11/01/2019  7:30 AM Simone Curia, APRN - CNP N [facility] Pain [facility]   03/06/2020 11:00 AM Eugenie Norrie, APRN - CNP N ENT [facility] [place]   06/11/2020  1:45 PM Shauna Hugh, PA-C N Pulm Med [facility] - [place]

## 2019-10-15 ENCOUNTER — Encounter

## 2019-10-15 MED ORDER — HYDROCODONE-ACETAMINOPHEN 5-325 MG PO TABS
5-325 MG | ORAL_TABLET | Freq: Four times a day (QID) | ORAL | 0 refills | Status: DC | PRN
Start: 2019-10-15 — End: 2019-11-14

## 2019-10-15 NOTE — Telephone Encounter (Signed)
OARRS reviewed. UDS: + for Zolpidem, Dihydrocodeine, Hydrocodone, Norhydrocodone, Hydromorphone, Lorazepam   Last seen: 08/01/2019. Follow-up: 11/01/2019

## 2019-10-22 ENCOUNTER — Inpatient Hospital Stay
Admit: 2019-10-22 | Discharge: 2019-10-22 | Disposition: A | Discharge status: Home / Self Care | Payer: PRIVATE HEALTH INSURANCE

## 2019-10-22 DIAGNOSIS — J069 Acute upper respiratory infection, unspecified: Secondary | ICD-10-CM

## 2019-10-22 MED ORDER — CETIRIZINE HCL 10 MG PO TABS
10 MG | ORAL_TABLET | Freq: Every day | ORAL | 0 refills | Status: AC
Start: 2019-10-22 — End: 2019-11-21

## 2019-10-22 MED ORDER — FLUTICASONE PROPIONATE 50 MCG/ACT NA SUSP
50 MCG/ACT | Freq: Every day | NASAL | 0 refills | Status: DC
Start: 2019-10-22 — End: 2020-04-16

## 2019-10-22 MED ORDER — AZITHROMYCIN 250 MG PO TABS
250 MG | PACK | ORAL | 0 refills | Status: AC
Start: 2019-10-22 — End: 2019-10-26

## 2019-10-22 MED ORDER — PSEUDOEPH-BROMPHEN-DM 30-2-10 MG/5ML PO SYRP
2-30-10 MG/5ML | Freq: Four times a day (QID) | ORAL | 0 refills | Status: DC | PRN
Start: 2019-10-22 — End: 2020-06-11

## 2019-10-22 NOTE — ED Triage Notes (Signed)
To room with c/o sore throat that started Thursday. "It feels like it is going to my ears and chest." slight dry cough.

## 2019-10-22 NOTE — ED Provider Notes (Signed)
[facility]  Urgent Care Encounter       CHIEF COMPLAINT       Chief Complaint   Patient presents with   ??? Pharyngitis   ??? Cough       Nurses Notes reviewed and I agree except as noted in the HPI.  HISTORY OF PRESENT ILLNESS   Theresa Vargas is a 64 y.o. female who presents to the [facility] urgent care for evaluation of pharyngitis and cough.  Patient reports a dry cough, pharyngitis, ear pressure, rhinorrhea, and chest congestion.  She denies nasal congestion or postnasal drainage.  She denies fever, chills, loss of taste or smell, nausea, vomiting, and diarrhea.  She reports the symptoms started roughly 5 days ago.  She reports only exposure is that her grandson who she watches every other week was exposed to the other grandmother with pneumonia.        The history is provided by the patient. No language interpreter was used.       REVIEW OF SYSTEMS     Review of Systems   Constitutional: Negative for activity change, appetite change, chills, fatigue and fever.   HENT: Positive for ear pain, rhinorrhea and sore throat. Negative for ear discharge.    Respiratory: Positive for cough. Negative for chest tightness and shortness of breath.    Cardiovascular: Negative for chest pain.   Gastrointestinal: Negative for diarrhea, nausea and vomiting.   Genitourinary: Negative for dysuria.   Skin: Negative for rash.   Allergic/Immunologic: Negative for environmental allergies and food allergies.   Neurological: Negative for dizziness and headaches.       PAST MEDICAL HISTORY         Diagnosis Date   ??? Depression    ??? 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 H. pylori infection 05/03/2018    stool   ??? Hyperlipidemia    ??? Hypertension    ??? Nausea & vomiting    ??? Panic disorder    ??? PONV (postoperative nausea and vomiting)    ??? Psychiatric problem    ??? Sleep apnea     on cpap       SURGICALHISTORY     Patient  has a past surgical history that includes Cholecystectomy (2007); Hysterectomy  (2007); Neck surgery (2008 [facility]); Tympanostomy tube placement (2006,2011,2016); skin biopsy (2007); Colonoscopy; Hemorrhoid surgery (02/2011); other surgical history (11/05/2010); Nerve Block (10/29/2014); other surgical history (Bilateral, 01/06/2015); back surgery (11/28/2015); pr create eardrum opening,gen anesth (Left, 07/19/2016); other surgical history (09/2010); other surgical history (09/2010); cardiovascular stress test (2018); cardiovascular stress test (2016); and Back Injection (Bilateral, 07/12/2019).    CURRENT MEDICATIONS       Previous Medications    COENZYME Q10 (COQ-10 PO)    Take 100 mg by mouth daily    CPAP MACHINE MISC    by Does not apply route Please check patient's PAP machine for proper functioning by [employer].    DILTIAZEM (TIAZAC) 300 MG EXTENDED RELEASE CAPSULE    TAKE 1 CAPSULE BY MOUTH ONE TIME A DAY    FLUOXETINE (PROZAC) 40 MG CAPSULE    Take 40 mg by mouth daily    HYDROCODONE-ACETAMINOPHEN (NORCO) 5-325 MG PER TABLET    Take 1 tablet by mouth every 6 hours as needed for Pain for up to 30 days.    HYOSCYAMINE (LEVSIN/SL) 125 MCG SUBLINGUAL TABLET    DISSOLVE 1 TABLET UNDER TONGUE EVERY FOUR  HOURS AS NEEDED    IBUPROFEN (ADVIL;MOTRIN) 200 MG TABLET    Take 200 mg by mouth every 6 hours as needed for Pain    INSULIN GLARGINE (LANTUS SC)    Inject into the skin 10 units daily increase by 1 unit daily until BS reach 150 goal    LORAZEPAM (ATIVAN) 1 MG TABLET    Take 1 mg by mouth every 6 hours as needed.      LOSARTAN-HYDROCHLOROTHIAZIDE (HYZAAR) 100-25 MG PER TABLET    TAKE 1 TABLET BY MOUTH ONE TIME A DAY    NOVOLOG FLEXPEN 100 UNIT/ML INJECTION PEN    Changed to sliding scale    ONE TOUCH ULTRA TEST STRIP    CHECK BLOOD SUGAR ONCE DAILY.    PRAVASTATIN (PRAVACHOL) 20 MG TABLET        SEMAGLUTIDE (OZEMPIC, 0.25 OR 0.5 MG/DOSE, SC)    Inject 0.5 mg into the skin once a week    ZOLPIDEM (AMBIEN) 10 MG TABLET    Take 10 mg by mouth nightly Takes every night       ALLERGIES      Patient is has No Known Allergies.    Patients   Immunization History   Administered Date(s) Administered   ??? Influenza Vaccine, unspecified formulation 11/16/2014       FAMILY HISTORY     Patient's family history includes Alcohol Abuse in her mother; COPD in her mother; Depression in her mother; Diabetes in her father, mother, and another family member; Heart Attack in her father; Heart Disease in an other family member; High Cholesterol in her father and mother; Hypertension in her father, mother, and another family member; Mental Illness in an other family member; Other in an other family member.    SOCIAL HISTORY     Patient  reports that she has never smoked. She has never used smokeless tobacco. She reports that she does not drink alcohol and does not use drugs.    PHYSICAL EXAM     ED TRIAGE VITALS  BP: (!) 144/87, Temp: 97 ??F (36.1 ??C), Pulse: 93, Resp: 18, SpO2: 96 %,Estimated body mass index is 32.91 kg/m?? as calculated from the following:    Height as of this encounter: 5' 6.5" (1.689 m).    Weight as of this encounter: 207 lb (93.9 kg).,No LMP recorded. Patient has had a hysterectomy.    Physical Exam  Vitals and nursing note reviewed.   Constitutional:       General: She is not in acute distress.     Appearance: Normal appearance. She is not ill-appearing, toxic-appearing or diaphoretic.   HENT:      Head: Normocephalic.      Right Ear: Ear canal and external ear normal.      Left Ear: Ear canal and external ear normal.      Nose: Nose normal. No congestion or rhinorrhea.      Mouth/Throat:      Mouth: Mucous membranes are moist.      Pharynx: Oropharynx is clear. Posterior oropharyngeal erythema present. No oropharyngeal exudate.   Cardiovascular:      Rate and Rhythm: Normal rate.      Pulses: Normal pulses.   Pulmonary:      Effort: Pulmonary effort is normal. No respiratory distress.      Breath sounds: No stridor. No wheezing or rhonchi.   Abdominal:      General: Abdomen is flat. Bowel sounds  are normal.  Palpations: Abdomen is soft.   Musculoskeletal:         General: No swelling or tenderness. Normal range of motion.      Cervical back: Normal range of motion.   Neurological:      General: No focal deficit present.      Mental Status: She is alert and oriented to person, place, and time.   Psychiatric:         Mood and Affect: Mood normal.         Behavior: Behavior normal.         DIAGNOSTIC RESULTS     Labs:No results found for this visit on 10/22/19.    IMAGING:    No orders to display         EKG: None      URGENT CARE COURSE:     Vitals:    10/22/19 1440   BP: (!) 144/87   Pulse: 93   Resp: 18   Temp: 97 ??F (36.1 ??C)   TempSrc: Temporal   SpO2: 96%   Weight: 207 lb (93.9 kg)   Height: 5' 6.5" (1.689 m)       Medications - No data to display         PROCEDURES:  None    FINAL IMPRESSION      1. Upper respiratory tract infection, unspecified type          DISPOSITION/ PLAN     Patient seen and evaluated for the above symptoms.  Assessment consistent with upper respiratory type infection.  She is provided a prescription for Bromfed-DM, Zyrtec, and Flonase.  She is provided a printed prescription for a azithromycin.  She is instructed to use the first 3 medications for 48 hours, then fill the antibiotic if no resolution of symptoms.  She is also instructed to use over-the-counter Tylenol or Motrin for pain or fever.  She is instructed to follow-up with her PCP in 3 to 5 days with new or worsening symptoms.  She is agreeable with the above plan denies questions or concerns at this time.      PATIENT REFERRED TO:  Fayrene Fearing T. Bowlus, MD  [address] / [place] [address]      DISCHARGE MEDICATIONS:  New Prescriptions    AZITHROMYCIN (ZITHROMAX Z-PAK) 250 MG TABLET    Take 2 tablets (500 mg) on Day 1, and then take 1 tablet (250 mg) on days 2 through 5.    BROMPHENIRAMINE-PSEUDOEPHEDRINE-DM 2-30-10 MG/5ML SYRUP    Take 5 mLs by mouth 4 times daily as needed for Congestion or Cough     CETIRIZINE (ZYRTEC) 10 MG TABLET    Take 1 tablet by mouth daily    FLUTICASONE (FLONASE) 50 MCG/ACT NASAL SPRAY    1 spray by Each Nostril route daily       Discontinued Medications    METFORMIN (GLUCOPHAGE) 500 MG TABLET    Take 500 mg by mouth 2 times daily (with meals)        Current Discharge Medication List          Renetta Chalk, APRN - CNP    (Please note that portions of this note were completed with a voice recognition program. Efforts were made to edit the dictations but occasionally words are mis-transcribed.)           Renetta Chalk, APRN - CNP  10/22/19 1510

## 2019-10-22 NOTE — ED Notes (Signed)
Patient understood instructions verbally,  Follow up with PCP with any concerns,e-script,ambulated self to lobby,stable condition. All belongings with patient.     Rondell Reams, LPN  50/[phone]

## 2019-11-01 ENCOUNTER — Ambulatory Visit: Payer: PRIVATE HEALTH INSURANCE

## 2019-11-01 ENCOUNTER — Ambulatory Visit: Admit: 2019-11-01 | Discharge: 2019-11-01 | Payer: PRIVATE HEALTH INSURANCE | Attending: Family

## 2019-11-01 DIAGNOSIS — M533 Sacrococcygeal disorders, not elsewhere classified: Secondary | ICD-10-CM

## 2019-11-08 ENCOUNTER — Encounter

## 2019-11-08 ENCOUNTER — Inpatient Hospital Stay: Admit: 2019-11-08 | Payer: PRIVATE HEALTH INSURANCE

## 2019-11-08 DIAGNOSIS — Z1231 Encounter for screening mammogram for malignant neoplasm of breast: Secondary | ICD-10-CM

## 2019-11-14 ENCOUNTER — Encounter

## 2019-11-14 MED ORDER — HYDROCODONE-ACETAMINOPHEN 5-325 MG PO TABS
5-325 MG | ORAL_TABLET | Freq: Four times a day (QID) | ORAL | 0 refills | Status: DC | PRN
Start: 2019-11-14 — End: 2019-12-13

## 2019-11-14 NOTE — Telephone Encounter (Signed)
OARRS reviewed. UDS + for Zolpidem, Dihydrocodeine, Hydrocodone, Norhydrocodone, Hydromorphone, Lorazepam, Zolpidem-Carboxyl  Last seen: 11/01/2019.  Follow-up: 01/31/2020

## 2019-11-27 NOTE — Telephone Encounter (Signed)
From: Kennedy Bucker  To: Robinette Haines Dunifon, APRN - CNP  Sent: 11/26/2019 9:49 PM EDT  Subject: Visit Follow-Up Question    hi crystal. i have been feeling dizzy and lighted headed the past week taking bonine. i read were i cant get in til next week. is there a way to get in tuesday or wednesday. [phone]. i will call the office if i don't hear back.

## 2019-11-27 NOTE — Telephone Encounter (Signed)
Please see MyChart message.  Recommend evaluation with PCP first, as those symptoms can have many causes, not only ENT related.

## 2019-11-27 NOTE — Telephone Encounter (Signed)
Message was sent to patient through mychart

## 2019-12-04 LAB — BASIC METABOLIC PANEL
Anion Gap: 8 mmol/L (ref 5–15)
BUN: 23 mg/dL (ref 5–27)
CO2: 37 mmol/L — ABNORMAL HIGH (ref 22–32)
Calcium: 9.2 mg/dL (ref 8.5–10.5)
Chloride: 96 mmol/L — ABNORMAL LOW (ref 98–109)
Creatinine: 0.94 mg/dL (ref 0.40–1.00)
EGFR IF NonAfrican American: >60 mL/min/{1.73_m2} (ref >59)
Glucose: 116 mg/dL — ABNORMAL HIGH (ref 65–99)
Potassium: 4.1 mmol/L (ref 3.5–5.0)
Sodium: 141 mmol/L (ref 134–146)
eGFR African American: >60 mL/min/{1.73_m2} (ref >59)

## 2019-12-04 LAB — CBC
Absolute Baso #: 0.1 10*9/L (ref 0.0–0.2)
Absolute Eos #: 0 10*9/L (ref 0.0–0.4)
Absolute Lymph #: 3.5 10*9/L (ref 1.0–3.5)
Absolute Mono #: 0.5 10*9/L (ref 0–0.9)
Absolute Neut #: 4.9 10*9/L (ref 1.5–6.6)
Basophils %: 0.8 %
Eosinophils %: 0.5 %
Hematocrit: 44.2 % (ref 35–47)
Hemoglobin: 15 g/dL (ref 11.7–15.5)
Lymphocyte %: 38.9 %
MCH: 29.7 pg (ref 27–34)
MCHC: 33.9 g/dL (ref 32–36)
MCV: 88 fL (ref 80–100)
MPV: 7.4 fL (ref 7–12)
Monocytes: 6 %
Neutrophils %: 53.8 %
Platelets: 443 10*9/L (ref 150–450)
RBC: 5.06 10*12/L (ref 3.80–5.20)
RDW: 13.6 % (ref 11.5–15.0)
WBC: 9.1 10*9/L (ref 4.0–11.0)

## 2019-12-04 LAB — HEPATIC FUNCTION PANEL
ALT: 313 U/L — ABNORMAL HIGH (ref 0–31)
AST: 27 U/L (ref 0–41)
Albumin: 4.4 g/dL (ref 3.2–5.3)
Alk Phosphatase: 146 U/L — ABNORMAL HIGH (ref 39–130)
Bilirubin, Direct: 0.1 mg/dL (ref 0.0–0.4)
Total Bilirubin: 0.4 mg/dL (ref 0.3–1.2)
Total Protein: 7.3 g/dL (ref 6.0–8.0)

## 2019-12-13 ENCOUNTER — Encounter

## 2019-12-13 MED ORDER — HYDROCODONE-ACETAMINOPHEN 5-325 MG PO TABS
5-325 MG | ORAL_TABLET | Freq: Four times a day (QID) | ORAL | 0 refills | Status: DC | PRN
Start: 2019-12-13 — End: 2020-01-13

## 2019-12-13 NOTE — Telephone Encounter (Signed)
OARRS reviewed. UDS: + for Zolpidem, Dihydrocodeine, Hydrocodone, Norhydrocodone, Hydromorphone, Lorazepam, Zolpidem-Carboxyl   Last seen: 11/01/2019. Follow-up: 01/31/20

## 2019-12-24 ENCOUNTER — Inpatient Hospital Stay: Payer: PRIVATE HEALTH INSURANCE

## 2019-12-24 DIAGNOSIS — J209 Acute bronchitis, unspecified: Secondary | ICD-10-CM

## 2019-12-24 LAB — STREP A ANTIGEN: GROUP A STREP CULTURE, REFLEX: NEGATIVE

## 2019-12-24 LAB — STREP A CULTURE, THROAT

## 2019-12-25 LAB — COVID-19 & INFLUENZA COMBO
INFLUENZA A: NOT DETECTED
INFLUENZA B: NOT DETECTED
SARS-CoV-2 RNA, RT PCR: NOT DETECTED

## 2019-12-26 LAB — CULTURE, THROAT: Throat/Nose Culture: NO GROWTH

## 2019-12-31 ENCOUNTER — Inpatient Hospital Stay: Admit: 2019-12-31 | Discharge: 2019-12-31 | Payer: PRIVATE HEALTH INSURANCE | Attending: Audiologist

## 2019-12-31 DIAGNOSIS — R42 Dizziness and giddiness: Secondary | ICD-10-CM

## 2019-12-31 NOTE — Progress Notes (Signed)
ACCOUNT #: [id number]                                    AUDIOLOGICAL EVALUATION WITH VNG      MEDICATIONS REVIEWED:  Patient has held appropriate medications for VNG testing.    REASON FOR TESTING: Audiometric evaluation and VNG testing per the request of Shara Blazing, PA, due to the diagnosis of dizziness, otalgia, and eustachian tube dysfunction. The patient complains of nearly constant dizziness and lightheadedness since approximately November 26, 2019. She denies any associated events or medical changes around that time. She denies any true rotary vertigo symptoms but describes constant lightheadedness and "pressure" which is exacerbated by quick head movement and getting up too quickly. She also reports a constant low frequency tinnitus in the left ear which started around the same time as the dizziness. 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 She currently has a tube in the left ear which was placed in 2018. She denies any current otalgia, aural fullness, or otorrhea. There is no significant history of loud noise exposure. The patient denies any significant family history of hearing loss or vertigo. She does have a history of high blood pressure and diabetes, controlled with medication. The patient does take Bonine for the dizziness which she reports does help to alleviate her symptoms. She also reports a history of anxiety for which she takes medication. The patient has received one session of balance physical therapy and she has another scheduled for tomorrow. She does have a history of spine surgery as well as previous eye surgery. The patient reports frequent double vision and noted seeing four different dots instead of one on today's oculomotor tasks.     OTOSCOPY: PE tube visible and appeared to be in place and patent in left ear canal.   Narrow ear canal with minimal non-occluding cerumen noted in right ear.    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 Air-bone gaps noted in the left ear at 500Hz  and 3000Hz  with additional asymmetry (possibly conductive) noted at 6000Hz  as well. Speech reception thresholds consistent with pure tone averages, bilaterally. Word recognition scores are excellent, bilaterally, with speech presented at average conversation levels in quiet.Tympanometry revealed a large ear canal volume with no measurable compliance on the left, consistent with a patent PE tube. The right tympanogram showed a normal canal volume and compliance with negative middle ear pressure (-210daPa), suggesting possible middle ear dysfunction. VNG testing showed no significant central or peripheral findings. Please note possible effects of middle ear dysfunction on caloric testing.      RECOMMENDATION(S):   1. Follow up with referring provider regarding today's results and any further testing or treatment recommendations.  2. Consider repeating caloric irrigations following resolution of middle ear status in both ears, per discretion of provider.  3. Repeat audiogram and tympanogram as medically indicated or in 6-12 months to rule out progression and monitor middle ear status.

## 2020-01-02 ENCOUNTER — Encounter

## 2020-01-02 ENCOUNTER — Inpatient Hospital Stay: Payer: PRIVATE HEALTH INSURANCE

## 2020-01-02 ENCOUNTER — Inpatient Hospital Stay: Admit: 2020-01-02 | Payer: PRIVATE HEALTH INSURANCE

## 2020-01-02 DIAGNOSIS — J209 Acute bronchitis, unspecified: Secondary | ICD-10-CM

## 2020-01-02 LAB — HEPATIC FUNCTION PANEL
ALT: 22 U/L (ref 11–66)
AST: 26 U/L (ref 5–40)
Albumin: 4.7 g/dL (ref 3.5–5.1)
Alkaline Phosphatase: 120 U/L (ref 38–126)
Bilirubin, Direct: <0.2 mg/dL (ref 0.0–0.3)
Total Bilirubin: 0.5 mg/dL (ref 0.3–1.2)
Total Protein: 7.3 g/dL (ref 6.1–8.0)

## 2020-01-07 ENCOUNTER — Inpatient Hospital Stay: Payer: PRIVATE HEALTH INSURANCE

## 2020-01-07 DIAGNOSIS — J209 Acute bronchitis, unspecified: Secondary | ICD-10-CM

## 2020-01-07 LAB — MONONUCLEOSIS SCREEN: MONONUCLEOSIS ANTIBODY: NEGATIVE

## 2020-01-08 ENCOUNTER — Encounter: Attending: Audiologist

## 2020-01-13 ENCOUNTER — Encounter

## 2020-01-14 MED ORDER — HYDROCODONE-ACETAMINOPHEN 5-325 MG PO TABS
5-325 MG | ORAL_TABLET | Freq: Four times a day (QID) | ORAL | 0 refills | Status: DC | PRN
Start: 2020-01-14 — End: 2020-02-12

## 2020-01-14 NOTE — Telephone Encounter (Signed)
OARRS reviewed. UDS: + for Ambien, Hydrocodone, Ativan.    Last seen: 11/01/19. Follow-up: 03/03/20

## 2020-01-19 ENCOUNTER — Encounter

## 2020-01-19 ENCOUNTER — Inpatient Hospital Stay: Payer: PRIVATE HEALTH INSURANCE

## 2020-01-19 ENCOUNTER — Inpatient Hospital Stay: Admit: 2020-01-19 | Payer: PRIVATE HEALTH INSURANCE

## 2020-01-19 DIAGNOSIS — Z01818 Encounter for other preprocedural examination: Secondary | ICD-10-CM

## 2020-01-19 LAB — BASIC METABOLIC PANEL
BUN: 12 mg/dL (ref 7–22)
CO2: 25 meq/L (ref 23–33)
Calcium: 10 mg/dL (ref 8.5–10.5)
Chloride: 96 meq/L — ABNORMAL LOW (ref 98–111)
Creatinine: 0.7 mg/dL (ref 0.4–1.2)
Glucose: 204 mg/dL — ABNORMAL HIGH (ref 70–108)
Potassium: 3.7 meq/L (ref 3.5–5.2)
Sodium: 137 meq/L (ref 135–145)

## 2020-01-19 LAB — CBC WITH AUTO DIFFERENTIAL
Basophils Absolute: 0 10*3/uL (ref 0.0–0.1)
Basophils: 0.4 %
Eosinophils Absolute: 0.1 10*3/uL (ref 0.0–0.4)
Eosinophils: 1.2 %
Hematocrit: 45.6 % (ref 37.0–47.0)
Hemoglobin: 15 gm/dl (ref 12.0–16.0)
Immature Grans (Abs): 0.02 10*3/uL (ref 0.00–0.07)
Immature Granulocytes: 0.2 %
Lymphocytes Absolute: 3.3 10*3/uL (ref 1.0–4.8)
Lymphocytes: 39.7 %
MCH: 29.4 pg (ref 26.0–33.0)
MCHC: 32.9 gm/dl (ref 32.2–35.5)
MCV: 89.2 fL (ref 81.0–99.0)
MPV: 9.2 fL — ABNORMAL LOW (ref 9.4–12.4)
Monocytes Absolute: 0.5 10*3/uL (ref 0.4–1.3)
Monocytes: 6.6 %
Platelets: 367 10*3/uL (ref 130–400)
RBC: 5.11 10*6/uL (ref 4.20–5.40)
RDW-CV: 12.8 % (ref 11.5–14.5)
RDW-SD: 41.8 fL (ref 35.0–45.0)
Seg Neutrophils: 51.9 %
Segs Absolute: 4.3 10*3/uL (ref 1.8–7.7)
WBC: 8.2 10*3/uL (ref 4.8–10.8)
nRBC: 0 /100 wbc

## 2020-01-19 LAB — GLOMERULAR FILTRATION RATE, ESTIMATED: Est, Glom Filt Rate: 85 mL/min/{1.73_m2} — AB

## 2020-01-19 LAB — ANION GAP: Anion Gap: 16 meq/L (ref 8.0–16.0)

## 2020-01-21 LAB — EKG 12-LEAD
Atrial Rate: 83 {beats}/min
P Axis: 65 degrees
P-R Interval: 162 ms
Q-T Interval: 376 ms
QRS Duration: 82 ms
QTc Calculation (Bazett): 441 ms
R Axis: 63 degrees
T Axis: 75 degrees
Ventricular Rate: 83 {beats}/min

## 2020-01-31 ENCOUNTER — Encounter: Attending: Family

## 2020-02-12 ENCOUNTER — Encounter

## 2020-02-12 MED ORDER — HYDROCODONE-ACETAMINOPHEN 5-325 MG PO TABS
5-325 MG | ORAL_TABLET | Freq: Four times a day (QID) | ORAL | 0 refills | Status: DC | PRN
Start: 2020-02-12 — End: 2020-03-12

## 2020-02-12 NOTE — Telephone Encounter (Signed)
OARRS reviewed. UDS: + for Zolpidem, Dihydrocodeine, Hydrocodone, Norhydrocodone, Hydromorphone, Lorazepam,   Last seen: 11/01/19. Follow-up: 03/03/20

## 2020-02-20 ENCOUNTER — Inpatient Hospital Stay: Payer: PRIVATE HEALTH INSURANCE

## 2020-02-20 DIAGNOSIS — H6692 Otitis media, unspecified, left ear: Secondary | ICD-10-CM

## 2020-02-20 LAB — COVID-19 & INFLUENZA COMBO
INFLUENZA A: NOT DETECTED
INFLUENZA B: NOT DETECTED
SARS-CoV-2 RNA, RT PCR: NOT DETECTED

## 2020-02-20 NOTE — Progress Notes (Signed)
Nasal swab collected and sent to lab. Pt tolerated well.

## 2020-03-03 ENCOUNTER — Inpatient Hospital Stay: Payer: PRIVATE HEALTH INSURANCE

## 2020-03-03 ENCOUNTER — Ambulatory Visit: Admit: 2020-03-03 | Discharge: 2020-03-03 | Payer: PRIVATE HEALTH INSURANCE | Attending: Family

## 2020-03-03 ENCOUNTER — Inpatient Hospital Stay: Admit: 2020-03-03 | Payer: PRIVATE HEALTH INSURANCE

## 2020-03-03 DIAGNOSIS — M961 Postlaminectomy syndrome, not elsewhere classified: Secondary | ICD-10-CM

## 2020-03-03 NOTE — Progress Notes (Signed)
[facility] [place] [facility]  [facility]  [address]  [place] [address]  Dept: [phone]  Dept Fax: [phone]  Loc: [phone]    Visit Date: 03/03/2020    Functionality Assessment/Goals Worksheet     On a scale of 0 (Does not Interfere) to 10 (Completely Interferes)     1.  Which number describes how during the past week pain has interfered with       the following:  A.  General Activity:  7  B.  Mood: 6  C.  Walking Ability:  9  D.  Normal Work (Includes both work outside the home and housework):  5  E.  Relations with Other People:   2  F.  Sleep:   2  G.  Enjoyment of Life:   2    2.  Patient Prefers to Take their Pain Medications:     [x]   On a regular basis   [x]   Only when necessary    []   Does not take pain medications    3.  What are the Patient's Goals/Expectations for Visiting Pain Management?     []   Learn about my pain    [x]   Receive Medication   []   Physical Therapy     []   Treat Depression   [x]   Receive Injections    []   Treat Sleep   []   Deal with Anxiety and Stress   []   Treat Opoid Dependence/Addiction   []   Other:      HPI:   Theresa Vargas is a 66 y.o. female is here today for    Chief Complaint: Low back pain    HPI   4 month FU. Continues to to have pain in low back and into SI area worse in left lide- aching and dull pain. Intermittent tingling down left left.   States that pain is a little worse at this time as she has not been exercising as much which helped and more depressed especially seasonal.     Current pain medications remain effective in decreasing pain to a tolerable level.     States that she is going to have a left shoulder arthroscopic surgery at [facility] March 23 rd as she had several injections without relief.   Pain increases with bending, lifting, twisting , walking, standing, getting up and down and housework or working at job, weather changes, stress and depressed mood       Medications  reviewed. Patient denies side effects with medications. Patient states she is taking medications as prescribed. Shedenies receiving pain medications from other sources. She denies any ER visits since last visit.    Pain scale with out pain medications or at its worst is 7-8/10.  Pain scale with pain medications or at its best is 2/10.  Last dose of Norco was today   Drug screen reviewed from 11/01/2019 and was appropriate  Pill count completed  today and WNL: Yes      The patienthas No Known Allergies.      Subjective:      Review of Systems   Constitutional: Positive for appetite change.   HENT: Negative.    Eyes: Positive for visual disturbance.        Wears corrective glasses    Respiratory: Negative.         CPAP at night    Cardiovascular: Negative.    Gastrointestinal: Negative.  Endocrine: Negative.    Genitourinary: Negative.    Musculoskeletal: Positive for arthralgias, back pain, joint swelling and myalgias. Negative for gait problem.        Ambulating without assist devices    Neurological: Positive for weakness and numbness. Negative for headaches.   Psychiatric/Behavioral: Negative.        Objective:     Vitals:    03/03/20 1237   BP: 118/68   Weight: 207 lb (93.9 kg)   Height: 5\' 6"  (1.676 m)       Physical Exam  Constitutional:       General: She is not in acute distress.     Appearance: She is well-developed.   HENT:      Head: Normocephalic and atraumatic.      Right Ear: External ear normal.      Left Ear: External ear normal.      Nose: Nose normal.   Eyes:      Conjunctiva/sclera: Conjunctivae normal.      Pupils: Pupils are equal, round, and reactive to light.   Neck:      Thyroid: No thyromegaly.      Comments: Decreased range of motion with ear to shoulder both sides, tender posterior over bone   Cardiovascular:      Rate and Rhythm: Normal rate and regular rhythm.      Heart sounds: No murmur heard.  No friction rub. No gallop.    Pulmonary:      Effort: Pulmonary effort is normal. No  respiratory distress.      Breath sounds: Normal breath sounds.   Abdominal:      General: Bowel sounds are normal.      Palpations: Abdomen is soft.   Musculoskeletal:         General: Tenderness present. No deformity.      Right shoulder: No tenderness. Normal range of motion.      Left shoulder: Tenderness and bony tenderness present. Decreased range of motion.      Left upper arm: No tenderness or bony tenderness.      Left elbow: Normal range of motion.      Left forearm: No tenderness or bony tenderness.      Cervical back: Neck supple. No tenderness.      Thoracic back: No tenderness. Normal range of motion.      Lumbar back: Tenderness and bony tenderness present. Decreased range of motion.        Back:       Right knee: Normal range of motion. No tenderness.      Left knee: Normal range of motion. No tenderness.   Skin:     General: Skin is warm and dry.      Findings: No erythema or rash.          Neurological:      General: No focal deficit present.      Mental Status: She is alert and oriented to person, place, and time.      Sensory: No sensory deficit.      Motor: Weakness present. No atrophy or abnormal muscle tone.      Coordination: Coordination abnormal.      Gait: Gait normal.      Deep Tendon Reflexes: Reflexes are normal and symmetric.      Reflex Scores:       Tricep reflexes are 2+ on the right side and 2+ on the left side.       Bicep reflexes are  2+ on the right side and 2+ on the left side.       Brachioradialis reflexes are 2+ on the right side and 2+ on the left side.       Patellar reflexes are 2+ on the right side and 2+ on the left side.       Achilles reflexes are 2+ on the right side and 2+ on the left side.     Comments: Bilateral lower and right upper extremity 5/5.     + bilateral SLR at 60 degrees        Psychiatric:         Behavior: Behavior normal.         Thought Content: Thought content normal.       FABER  Patricks test  positive  Yeoman's  positive  Gaenslen's   positive       Assessment:     1. Lumbar post-laminectomy syndrome    2. Lumbosacral radiculitis    3. Spinal stenosis of lumbar region with neurogenic claudication    4. SI (sacroiliac) pain    5. Sacroiliac inflammation (HCC)    6. Spondylosis of lumbar region without myelopathy or radiculopathy    7. Chronic pain syndrome    8. Chronic left shoulder pain    9. Chronic, continuous use of opioids            Plan:      ?? OARRS reviewed. Current MED: 20.00  ?? Patient was offered naloxone for home.   ?? Discussed long term side effects of medications, tolerance, dependency and addiction.  ?? Previous UDS reviewed  ?? UDS preformed today for compliance.  ?? Patient told can not receive any pain medications from any other source.  ?? No evidence of abuse, diversion or aberrant behavior.  ??? Medications and/or procedures to improve function and quality of life- patient understanding with this and that may not be pain free  ??? Discussed with patient about safe storage of medications at home  ??? Discussed possible weaning of medication dosing dependent on treatment/procedure results.   ??? Discussed with patient about risks with procedure including infection, reaction to medication, increased pain, or bleeding.  ?? Procedure notes reviewed in detail.   ?? Received 70% to 80% relief of low back pain and SI pain from SI MBB # 1 for over a month but states insurance did not cover it. Will repeat when she changes insurance or if they cover them in future.   ??? Discussed updating imaging (MRI and SCS again. She wants to think about it. Ordered updated lumbar xray   ??? Awaiting left shoulder surgery with [facility]. Instructed to keep me posted   ?? Continue Norco 5/325 QID prn-??filled 02/15/2020 and has plenty  ?? Patient is complaint.     Meds. Prescribed:   No orders of the defined types were placed in this encounter.      Return in about 3 months (around 05/31/2020), or if symptoms worsen or fail to improve, for follow up  for medications.                Electronically signed by Simone Curia, APRN - CNP on1/31/2022 at 1:15 PM

## 2020-03-06 ENCOUNTER — Encounter: Attending: Family

## 2020-03-12 ENCOUNTER — Encounter

## 2020-03-13 MED ORDER — HYDROCODONE-ACETAMINOPHEN 5-325 MG PO TABS
5-325 MG | ORAL_TABLET | Freq: Four times a day (QID) | ORAL | 0 refills | Status: DC | PRN
Start: 2020-03-13 — End: 2020-04-10

## 2020-03-13 NOTE — Telephone Encounter (Signed)
OARRS reviewed. UDS: + for  ambien hydrocodone fluoxetine lorazepam consistent.   Last seen:03/03/2020. Follow-up:   Future Appointments   Date Time Provider Department Center   03/17/2020  1:00 PM Reed Pandy, AuD AUDIOLOGY [facility] HOD   06/02/2020 12:30 PM Simone Curia, APRN - CNP [facility] [place]   06/11/2020  1:45 PM Shauna Hugh, PA-C [facility] [place]

## 2020-03-17 ENCOUNTER — Inpatient Hospital Stay: Admit: 2020-03-17 | Discharge: 2020-03-17 | Payer: PRIVATE HEALTH INSURANCE | Attending: Audiologist

## 2020-03-17 DIAGNOSIS — Z9622 Myringotomy tube(s) status: Secondary | ICD-10-CM

## 2020-03-17 DIAGNOSIS — H9312 Tinnitus, left ear: Secondary | ICD-10-CM

## 2020-03-17 NOTE — Progress Notes (Signed)
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 The patient had bilateral PE Tubes placed about 6 weeks ago. She has not noticed a significant change in the hearing of either ear and continues to report constant low frequency tinnitus and cracking/popping sounds in her left ear only. She reports hearing difficulties particularly understanding conversation. Her dizziness and light headedness has mostly resolved.     OTOSCOPY: Clear canal with PE tube visible in TM, bilaterally.      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  Word recognition ability is excellent at 100% for both ears. Puretone results are mostly consistent with the patient's 12/31/2019 audiogram except for an improvement of 10 dB at 3000 Hz and 8000 Hz in the left ear. Tympanometry revealed a flat tympanogram with a large volume consistent with a patent PE tube in both ears.      RECOMMENDATION(S):   1- Follow up with Shara Blazing, PA regarding these results and any further testing or treatment recommendations.  2- Repeat testing as medically indicated or in 6-12 months to monitor for progression, sooner with any significant changes or new concerns.  4- Binaural hearing aids could be considered pending medical clearance and patient motivation.

## 2020-03-21 NOTE — Telephone Encounter (Signed)
Error

## 2020-03-25 NOTE — Telephone Encounter (Signed)
Left message for patient informing her that she does not have coverage for hearing aids. Estimate of Insurance Benefits scanned.

## 2020-03-28 NOTE — Telephone Encounter (Signed)
LM for pt to call office back new patient appt needed ref faxed from Dr. Seleta Rhymes dx: pulsatile tinnitus.

## 2020-03-31 NOTE — Telephone Encounter (Signed)
Appt scheduled.

## 2020-04-08 ENCOUNTER — Encounter

## 2020-04-10 ENCOUNTER — Encounter

## 2020-04-10 MED ORDER — HYDROCODONE-ACETAMINOPHEN 5-325 MG PO TABS
5-325 MG | ORAL_TABLET | Freq: Four times a day (QID) | ORAL | 0 refills | Status: DC | PRN
Start: 2020-04-10 — End: 2020-04-17

## 2020-04-10 NOTE — Telephone Encounter (Signed)
OARRS reviewed. UDS: + for Zolpidem, Dihydrocodeine, Hydrocodone, Norhydrocodone, Hydromorphone, Fluoxetine, Lorazepam, Zolpidem-Carboxyl    Last seen: 03/03/2020. Follow-up: 06/02/20

## 2020-04-16 ENCOUNTER — Inpatient Hospital Stay
Admit: 2020-04-16 | Discharge: 2020-04-16 | Disposition: A | Discharge status: Home / Self Care | Payer: PRIVATE HEALTH INSURANCE | Attending: Emergency Medicine

## 2020-04-16 DIAGNOSIS — L03032 Cellulitis of left toe: Secondary | ICD-10-CM

## 2020-04-16 LAB — GLOMERULAR FILTRATION RATE, ESTIMATED: Est, Glom Filt Rate: 85 mL/min/{1.73_m2} — AB

## 2020-04-16 LAB — CBC WITH AUTO DIFFERENTIAL
Basophils Absolute: 0 10*3/uL (ref 0.0–0.1)
Basophils: 0.3 %
Eosinophils Absolute: 0.1 10*3/uL (ref 0.0–0.4)
Eosinophils: 0.8 %
Hematocrit: 45.1 % (ref 37.0–47.0)
Hemoglobin: 14.3 gm/dl (ref 12.0–16.0)
Immature Grans (Abs): 0.02 10*3/uL (ref 0.00–0.07)
Immature Granulocytes: 0.3 %
Lymphocytes Absolute: 1.9 10*3/uL (ref 1.0–4.8)
Lymphocytes: 27.1 %
MCH: 29.3 pg (ref 26.0–33.0)
MCHC: 31.7 gm/dl — ABNORMAL LOW (ref 32.2–35.5)
MCV: 92.4 fL (ref 81.0–99.0)
MPV: 9.3 fL — ABNORMAL LOW (ref 9.4–12.4)
Monocytes Absolute: 0.4 10*3/uL (ref 0.4–1.3)
Monocytes: 5.8 %
Platelets: 354 10*3/uL (ref 130–400)
RBC: 4.88 10*6/uL (ref 4.20–5.40)
RDW-CV: 12.7 % (ref 11.5–14.5)
RDW-SD: 42.8 fL (ref 35.0–45.0)
Seg Neutrophils: 65.7 %
Segs Absolute: 4.7 10*3/uL (ref 1.8–7.7)
WBC: 7.1 10*3/uL (ref 4.8–10.8)
nRBC: 0 /100 wbc

## 2020-04-16 LAB — BASIC METABOLIC PANEL
BUN: 13 mg/dL (ref 7–22)
CO2: 24 meq/L (ref 23–33)
Calcium: 9.8 mg/dL (ref 8.5–10.5)
Chloride: 100 meq/L (ref 98–111)
Creatinine: 0.7 mg/dL (ref 0.4–1.2)
Glucose: 170 mg/dL — ABNORMAL HIGH (ref 70–108)
Potassium: 3.9 meq/L (ref 3.5–5.2)
Sodium: 140 meq/L (ref 135–145)

## 2020-04-16 LAB — ANION GAP: Anion Gap: 16 meq/L (ref 8.0–16.0)

## 2020-04-16 MED ORDER — CEPHALEXIN 500 MG PO CAPS
500 MG | ORAL_CAPSULE | Freq: Three times a day (TID) | ORAL | 0 refills | Status: DC
Start: 2020-04-16 — End: 2020-04-17

## 2020-04-16 MED ORDER — MUPIROCIN 2 % EX OINT
2 % | CUTANEOUS | 0 refills | Status: AC
Start: 2020-04-16 — End: 2020-04-23

## 2020-04-16 NOTE — ED Triage Notes (Signed)
Pt to [facility] ambulatory with  Left great toe pain.  This started 2 weeks ago.

## 2020-04-16 NOTE — ED Provider Notes (Signed)
[facility] [facility]  UrgentCare Encounter      CHIEFCOMPLAINT       Chief Complaint   Patient presents with   ??? Toe Pain     left great toe        Nurses Notes reviewed and I agree except as noted in the HPI.  HISTORY OF PRESENT ILLNESS   Theresa Vargas is a 65 y.o. female who presents with 2-week history of left great toe pain, redness, tenderness and swelling.  She denies pain at this time.  She has abnormal nail plate similar to when she experienced fungal infection of the nail plate in the past, that required removal.  Has appointment with her podiatrist in 2 days.  No fever, vomiting, proximal red streaks, chest pain, shortness of breath, abdominal pain, motor or sensory deficits.  No recent trauma to the toe.  No purulent drainage or bleeding.  Patient with diabetes no PAD.  Blood sugar today 168  GFR 85.  High A1Cs, non-smoker  REVIEW OF SYSTEMS     Review of Systems   Constitutional: Negative for appetite change, chills, fatigue, fever and unexpected weight change.        No fever normal appetite   HENT: Negative for congestion, ear discharge, ear pain, facial swelling, hearing loss, nosebleeds, postnasal drip, sinus pressure, sore throat, trouble swallowing and voice change.         No upper respiratory symptoms   Eyes: Negative for pain, discharge, redness and visual disturbance.        No redness or drainage   Respiratory: Negative for cough, choking, shortness of breath, wheezing and stridor.         No cough or shortness of breath   Cardiovascular: Negative for chest pain and leg swelling.        No chest pain or syncope   Gastrointestinal: Negative for abdominal pain, blood in stool, constipation, diarrhea, nausea and vomiting.        No abdominal pain or vomit   Genitourinary: Negative for dysuria, flank pain, frequency, hematuria, urgency, vaginal bleeding and vaginal discharge.   Musculoskeletal: Negative for arthralgias, back pain, neck pain and neck stiffness.   Skin: Negative  for rash.        Redness tenderness swelling pain left great toe   Neurological: Negative for dizziness, seizures, syncope, weakness, light-headedness and headaches.        No headache   Hematological: Negative for adenopathy. Does not bruise/bleed easily.   Psychiatric/Behavioral: Negative for confusion, sleep disturbance and suicidal ideas. The patient is not nervous/anxious.    red and bold elements reviewed    PAST MEDICAL HISTORY         Diagnosis Date   ??? Depression    ??? 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 H. pylori infection 05/03/2018    stool   ??? Hyperlipidemia    ??? Hypertension    ??? Nausea & vomiting    ??? Panic disorder    ??? PONV (postoperative nausea and vomiting)    ??? Psychiatric problem    ??? Sleep apnea     on cpap       SURGICAL HISTORY     Patient  has a past surgical history that includes Cholecystectomy (2007); Neck surgery (2008 [facility]); Tympanostomy tube placement (2006,2011,2016); skin biopsy (2007); Colonoscopy; Hemorrhoid surgery (02/2011); other surgical history (11/05/2010); Nerve Block (10/29/2014); other surgical history (Bilateral, 01/06/2015); back surgery (  11/28/2015); pr create eardrum opening,gen anesth (Left, 07/19/2016); other surgical history (09/2010); other surgical history (09/2010); cardiovascular stress test (2018); cardiovascular stress test (2016); Back Injection (Bilateral, 07/12/2019); and Hysterectomy (2007).    CURRENT MEDICATIONS       Discharge Medication List as of 04/16/2020  8:46 AM      CONTINUE these medications which have NOT CHANGED    Details   HYDROcodone-acetaminophen (NORCO) 5-325 MG per tablet Take 1 tablet by mouth every 6 hours as needed for Pain for up to 30 days., Disp-120 tablet, R-0Normal      ibuprofen (ADVIL;MOTRIN) 200 MG tablet Take 200 mg by mouth every 6 hours as needed for PainHistorical Med      brompheniramine-pseudoephedrine-DM 2-30-10 MG/5ML syrup Take 5 mLs by mouth 4 times daily as needed for Congestion or Cough,  Disp-118 mL, R-0Normal      FLUoxetine (PROZAC) 40 MG capsule Take 40 mg by mouth dailyHistorical Med      Coenzyme Q10 (COQ-10 PO) Take 100 mg by mouth dailyHistorical Med      Semaglutide (OZEMPIC, 0.25 OR 0.5 MG/DOSE, SC) Inject 0.5 mg into the skin once a weekHistorical Med      NOVOLOG FLEXPEN 100 UNIT/ML injection pen Changed to sliding scale, DAWHistorical Med      pravastatin (PRAVACHOL) 20 MG tablet Historical Med      Insulin Glargine (LANTUS SC) Inject into the skin 10 units daily increase by 1 unit daily until BS reach 150 goalHistorical Med      diltiazem (TIAZAC) 300 MG extended release capsule TAKE 1 CAPSULE BY MOUTH ONE TIME A DAY, R-3Historical Med      hyoscyamine (LEVSIN/SL) 125 MCG sublingual tablet DISSOLVE 1 TABLET UNDER TONGUE EVERY FOUR HOURS AS NEEDED, R-5Historical Med      CPAP Machine MISC Starting Mon 08/01/2017, Disp-1 each, R-0, PrintPlease check patient's PAP machine for proper functioning by DME Company.      losartan-hydrochlorothiazide (HYZAAR) 100-25 MG per tablet TAKE 1 TABLET BY MOUTH ONE TIME A DAY, R-11Historical Med      ONE TOUCH ULTRA TEST strip CHECK BLOOD SUGAR ONCE DAILY., R-3, DAWHistorical Med      zolpidem (AMBIEN) 10 MG tablet Take 10 mg by mouth nightly Takes every night      lorazepam (ATIVAN) 1 MG tablet Take 1 mg by mouth every 6 hours as needed.               ALLERGIES     Patient is has No Known Allergies.    FAMILY HISTORY     Patient'sfamily history includes Alcohol Abuse in her mother; COPD in her mother; Depression in her mother; Diabetes in her father, mother, and another family member; Heart Attack in her father; Heart Disease in an other family member; High Cholesterol in her father and mother; Hypertension in her father, mother, and another family member; Mental Illness in an other family member; Other in an other family member.    SOCIAL HISTORY     Patient  reports that she has never smoked. She has never used smokeless tobacco. She reports that she  does not drink alcohol and does not use drugs.    PHYSICAL EXAM     ED TRIAGE VITALS  BP: (!) 162/76, Temp: 98.5 ??F (36.9 ??C), Pulse: 92, Resp: 16, SpO2: 95 %  Physical Exam  Vitals and nursing note reviewed.   Constitutional:       General: She is not in acute distress.  Appearance: She is well-developed. She is not ill-appearing.      Comments: Moist membranes   HENT:      Head: Normocephalic and atraumatic.      Right Ear: External ear normal.      Left Ear: External ear normal.      Nose: Nose normal.      Mouth/Throat:      Pharynx: No oropharyngeal exudate.      Comments: Oropharynx normal  Eyes:      General: No scleral icterus.        Right eye: No discharge.         Left eye: No discharge.      Extraocular Movements:      Right eye: Normal extraocular motion.      Left eye: Normal extraocular motion.      Conjunctiva/sclera: Conjunctivae normal.      Pupils: Pupils are equal, round, and reactive to light.      Comments: Conjunctiva clear   Neck:      Thyroid: No thyromegaly.      Vascular: No JVD.      Comments: No meningismus  Cardiovascular:      Rate and Rhythm: Normal rate and regular rhythm.      Pulses: Normal pulses.      Heart sounds: Normal heart sounds, S1 normal and S2 normal. No murmur heard.  No friction rub. No gallop.       Comments: No murmur  Pulmonary:      Effort: Pulmonary effort is normal. No tachypnea or respiratory distress.      Breath sounds: Normal breath sounds. No stridor. No decreased breath sounds, wheezing, rhonchi or rales.      Comments: No cough lungs clear  Chest:      Chest wall: No tenderness.   Abdominal:      General: Bowel sounds are normal. There is no distension.      Palpations: Abdomen is soft. There is no mass.      Tenderness: There is no abdominal tenderness. There is no guarding or rebound.   Musculoskeletal:         General: No tenderness. Normal range of motion.      Cervical back: Normal range of motion.      Comments: Joints normal   Lymphadenopathy:       Cervical: No cervical adenopathy.      Right cervical: No superficial cervical adenopathy.     Left cervical: No superficial cervical adenopathy.   Skin:     General: Skin is warm and dry.      Findings: No erythema or rash.      Comments: Distal left great toe reveals redness, tenderness, swelling.  There is onychomycosis of the nail plate.  No abscess or felon.  No septic joint or tenosynovitis.  No evidence of osteomyelitis.  Most consistent with cellulitis   Neurological:      Mental Status: She is alert and oriented to person, place, and time.      Cranial Nerves: No cranial nerve deficit.      Motor: No abnormal muscle tone.      Coordination: Coordination normal.      Deep Tendon Reflexes: Reflexes are normal and symmetric. Reflexes normal.      Comments: Appropriate, normal gait, no focal finding   Psychiatric:         Behavior: Behavior normal.         Thought Content: Thought content normal.  Judgment: Judgment normal.         DIAGNOSTIC RESULTS   Labs: No results found for this visit on 04/16/20.    IMAGING:  No orders to display     URGENT CARE COURSE:     Vitals:    04/16/20 0816   BP: (!) 162/76   Pulse: 92   Resp: 16   Temp: 98.5 ??F (36.9 ??C)   TempSrc: Temporal   SpO2: 95%   Weight: 197 lb (89.4 kg)   Height: 5\' 6"  (1.676 m)       Medications - No data to display  PROCEDURES:  None  FINALIMPRESSION      1. Cellulitis of great toe, left    2. Onychomycosis of left great toe    3. Diabetes mellitus type 2 in obese (HCC)    4. Elevated blood pressure reading        DISPOSITION/PLAN   DISPOSITION Decision To Discharge 04/16/2020 08:26:38 AM  Nontoxic, well-hydrated, normal airway.  No airway abscess or epiglottitis, sepsis, CNS infection, pneumonia, hypoxia, bronchospasm.  No abscess, osteomyelitis, septic joint, gangrenous changes.  Patient has cellulitis of the left great toe.  Will treat with cephalexin, Bactroban after peroxide soaks, increased oral clear liquids, rest.  Patient to keep  appointment with podiatrist in 2 days for treatment of nail plate.  Patient understands to go to ED if worse  PATIENT REFERRED TO:  Keep appointment with podiatrist in 2 days    In 2 days  Keep appointment with podiatrist in 2 days, go to emergency if worse    DISCHARGE MEDICATIONS:  Discharge Medication List as of 04/16/2020  8:46 AM      START taking these medications    Details   cephALEXin (KEFLEX) 500 MG capsule Take 1 capsule by mouth 3 times daily for 21 doses, Disp-21 capsule, R-0Print      mupirocin (BACTROBAN) 2 % ointment Apply topically 3 times daily.  Apply after warm soaks, Disp-22 g, R-0, Print           Discharge Medication List as of 04/16/2020  8:46 AM          Robley Friesurtis L Lanah Steines, MD         Robley Friesurtis L Aniqa Hare, MD  04/16/20 [phone]

## 2020-04-17 ENCOUNTER — Ambulatory Visit: Admit: 2020-04-17 | Discharge: 2020-04-17 | Payer: PRIVATE HEALTH INSURANCE | Attending: Internal Medicine

## 2020-04-17 DIAGNOSIS — I10 Essential (primary) hypertension: Secondary | ICD-10-CM

## 2020-04-17 LAB — EKG 12-LEAD
Atrial Rate: 81 {beats}/min
P Axis: 68 degrees
P-R Interval: 178 ms
Q-T Interval: 398 ms
QRS Duration: 82 ms
QTc Calculation (Bazett): 462 ms
R Axis: 64 degrees
T Axis: 70 degrees
Ventricular Rate: 81 {beats}/min

## 2020-04-17 NOTE — Addendum Note (Signed)
Addended by: Ian Bushman on: 04/17/2020 09:00 AM     Modules accepted: Orders

## 2020-04-17 NOTE — Progress Notes (Signed)
[facility] [place] [facility]  [facility]  [address]  [address]  [place] [address]  Dept: [phone]  Dept Fax: [phone]  Loc: [phone]    Visit Date: 04/17/2020    Theresa Vargas is a 65 y.o. female  who presented for:  Chief Complaint   Patient presents with   ??? New Patient   ??? Pre-op Exam     L shoulder arthroscopy       HPI:   65 yo female with PMH DM2, HTN, HLD, and eustachian tube dysfunction s/p bilateral tympanostomy tube placement recently who is referred for evaluation of bilateral pulsatile tinnitus. Reports constant ringing in the left ear, which is unchanged since ear tube placement. She reports since surgery she has noticed a "thumping" sensation in right ear intermittently, approximately 4 times in the past month. Also feels off balance when walking. Follows with ENT for this, audiology testing recently normal. BP 148/83, has been on HCTZ, Diltiazem, and losartan for several years. PCP ordered MRA head and Carotid US - to be completed within the next week.    She will also be having left shoulder arthroscopy completed later this month. No prior history of heart disease. Denies lightheadedness, syncope, headaches, vision changes, chest pain, or shortness or breath. Non-smoker.        Current Outpatient Medications:   ???  losartan (COZAAR) 100 MG tablet, Take 100 mg by mouth daily, Disp: , Rfl:   ???  hydroCHLOROthiazide (HYDRODIURIL) 25 MG tablet, Take 25 mg by mouth daily, Disp: , Rfl:   ???  HYDROcodone-acetaminophen (NORCO) 5-325 MG per tablet, , Disp: , Rfl:   ???  mupirocin (BACTROBAN) 2 % ointment, Apply topically 3 times daily.  Apply after warm soaks, Disp: 22 g, Rfl: 0  ???  ibuprofen (ADVIL;MOTRIN) 200 MG tablet, Take 200 mg by mouth every 6 hours as needed for Pain, Disp: , Rfl:   ???  FLUoxetine (PROZAC) 40 MG capsule, Take 40 mg by mouth daily, Disp: , Rfl:   ???  Coenzyme Q10 (COQ-10 PO), Take 100 mg by mouth daily, Disp: , Rfl:   ???  Semaglutide (OZEMPIC,  0.25 OR 0.5 MG/DOSE, SC), Inject 0.5 mg into the skin once a week, Disp: , Rfl:   ???  NOVOLOG FLEXPEN 100 UNIT/ML injection pen, Changed to sliding scale, Disp: , Rfl:   ???  pravastatin (PRAVACHOL) 20 MG tablet, , Disp: , Rfl:   ???  Insulin Glargine (LANTUS SC), Inject into the skin 10 units daily increase by 1 unit daily until BS reach 150 goal, Disp: , Rfl:   ???  diltiazem (TIAZAC) 300 MG extended release capsule, TAKE 1 CAPSULE BY MOUTH ONE TIME A DAY, Disp: , Rfl: 3  ???  hyoscyamine (LEVSIN/SL) 125 MCG sublingual tablet, DISSOLVE 1 TABLET UNDER TONGUE EVERY FOUR HOURS AS NEEDED, Disp: , Rfl: 5  ???  CPAP Machine MISC, by Does not apply route Please check patient's PAP machine for proper functioning by [employer]., Disp: 1 each, Rfl: 0  ???  ONE TOUCH ULTRA TEST strip, CHECK BLOOD SUGAR ONCE DAILY., Disp: , Rfl: 3  ???  zolpidem (AMBIEN) 10 MG tablet, Take 10 mg by mouth nightly Takes every night, Disp: , Rfl:   ???  lorazepam (ATIVAN) 1 MG tablet, Take 1 mg by mouth every 6 hours as needed.  , Disp: , Rfl:   ???  brompheniramine-pseudoephedrine-DM 2-30-10 MG/5ML syrup, Take 5 mLs by mouth 4 times  daily as needed for Congestion or Cough, Disp: 118 mL, Rfl: 0    Past Medical History  Theresa Vargas  has a past medical history of Depression, Diabetes mellitus (HCC), H. pylori infection, Hyperlipidemia, Hypertension, Nausea & vomiting, Panic disorder, PONV (postoperative nausea and vomiting), Psychiatric problem, and Sleep apnea.    Social History  Theresa Vargas  reports that she has never smoked. She has never used smokeless tobacco. She reports that she does not drink alcohol and does not use drugs.    Family History  Theresa Vargas family history includes Alcohol Abuse in her mother; COPD in her mother; Depression in her mother; Diabetes in her father, mother, and another family member; Heart Attack in her father; Heart Disease in an other family member; High Cholesterol in her father and mother; Hypertension in her father, mother, and another  family member; Mental Illness in an other family member; Other in an other family member.    Past Surgical History   Past Surgical History:   Procedure Laterality Date   ??? BACK INJECTION Bilateral 07/12/2019    bilateral SI MBB # 1 performed by Halina Andreas, MD at [facility] OR   ??? BACK SURGERY  11/28/2015    [place]   ??? CARDIOVASCULAR STRESS TEST  2018   ??? CARDIOVASCULAR STRESS TEST  2016   ??? CHOLECYSTECTOMY  2007    Dr Neoma Laming   ??? COLONOSCOPY      2006-Dr Neidich   ??? HEMORRHOID SURGERY  02/2011    Dr Mackey Birchwood skin tag removal   ??? HYSTERECTOMY  2007    Dr Marlou Porch   ??? NECK SURGERY  2008 [facility]    [facility]   ??? NERVE BLOCK  10/29/2014    LUMBAR FACET INJECTION   ??? OTHER SURGICAL HISTORY  11/05/2010    hemmoridectomy-Dr Olt   ??? OTHER SURGICAL HISTORY Bilateral 01/06/2015    Lumbar Facet   ??? OTHER SURGICAL HISTORY  09/2010    hemorrhoid banding-Dr Neidich   ??? OTHER SURGICAL HISTORY  09/2010    hemorrhoid banding-Dr Neidich   ??? PR CREATE EARDRUM OPENING,GEN ANESTH Left 07/19/2016    MYRINGOTOMY TYPANOSTOMY TUBE PLACEMENT, LEFT performed by Geoffery Lyons, MD at [facility] OR   ??? SKIN BIOPSY  2007    left upper arm-benign Dr Rosie Fate   ??? TYMPANOSTOMY TUBE PLACEMENT  2006,2011,2016       Subjective:     REVIEW OF SYSTEMS  Constitutional: denies sweats, chills and fever  HENT: denies  congestion, sinus pressure, sneezing and sore throat. Positive tinnitus  Eyes: denies  pain, discharge, redness and itching.   Respiratory: denies apnea, cough  Gastrointestinal: denies blood in stool, constipation, diarrhea   Endocrine: denies cold intolerance, heat intolerance, polydipsia.  Genitourinary: denies dysuria, enuresis, flank pain and hematuria.   Musculoskeletal: denies arthralgias, joint swelling and neck pain.   Neurological: denies numbness and headaches.   Psychiatric/Behavioral: denies agitation, confusion, decreased concentration and dysphoric mood    All others reviewed and are negative.    Objective:     BP (!) 148/83    Pulse 98    Ht 5' 6.5" (1.689 m)    Wt 196 lb 9.6 oz (89.2 kg)    BMI 31.26 kg/m??     Wt Readings from Last 3 Encounters:   04/17/20 196 lb 9.6 oz (89.2 kg)   04/16/20 197 lb (89.4 kg)   03/03/20 207 lb (93.9 kg)     BP Readings from Last  3 Encounters:   04/17/20 (!) 148/83   04/16/20 (!) 162/76   03/03/20 118/68       PHYSICAL EXAM  Constitutional: Oriented to person, place, and time. Appears well-developed and well-nourished.   HENT:   Head: Normocephalic and atraumatic.   Eyes: EOM are normal. Pupils are equal, round, and reactive to light.   Neck: Normal range of motion. Neck supple. No JVD present.   Cardiovascular: Normal rate , normal heart sounds and intact distal pulses.    Pulmonary/Chest: Effort normal and breath sounds normal. No respiratory distress. No wheezes. No rales.   Abdominal: Soft. Bowel sounds are normal. No distension. There is no tenderness.   Musculoskeletal: Normal range of motion. No edema.   Neurological: Alert and oriented to person, place, and time. No cranial nerve deficit. Coordination normal.   Skin: Skin is warm and dry.   Psychiatric: Normal mood and affect.       No results found for: CKTOTAL, CKMB, CKMBINDEX    Lab Results   Component Value Date    WBC 7.1 04/16/2020    RBC 4.88 04/16/2020    RBC 5.06 12/03/2019    HGB 14.3 04/16/2020    HCT 45.1 04/16/2020    MCV 92.4 04/16/2020    MCH 29.3 04/16/2020    MCHC 31.7 04/16/2020    RDW 13.6 12/03/2019    PLT 354 04/16/2020    MPV 9.3 04/16/2020       Lab Results   Component Value Date    NA 140 04/16/2020    K 3.9 04/16/2020    K 3.5 04/26/2018    CL 100 04/16/2020    CO2 24 04/16/2020    BUN 13 04/16/2020    LABALBU 4.7 01/02/2020    CREATININE 0.7 04/16/2020    CALCIUM 9.8 04/16/2020    LABGLOM 85 04/16/2020    GLUCOSE 170 04/16/2020    GLUCOSE 116 12/03/2019       Lab Results   Component Value Date    ALKPHOS 120 01/02/2020    ALT 22 01/02/2020    AST 26 01/02/2020    PROT 7.3 01/02/2020     BILITOT 0.5 01/02/2020    BILIDIR <0.2 01/02/2020    LABALBU 4.7 01/02/2020       Lab Results   Component Value Date    MG 1.9 04/26/2018       Lab Results   Component Value Date    INR 0.93 11/13/2015         Lab Results   Component Value Date    LABA1C 6.6 05/31/2019       Lab Results   Component Value Date    TRIG 290 05/31/2019    HDL 34 05/31/2019    LDLCALC 78 05/31/2019       Lab Results   Component Value Date    TSH 1.480 08/01/2018         Testing Reviewed:      I haveindividually reviewed the below cardiac tests    EKG: 04/16/20: Normal sinus rhythm, no abnormalities    ECHO: Results for orders placed during the hospital encounter of 09/06/14    ECHO Complete 2D W Doppler W Color    Narrative  Transthoracic Echocardiography Report (TTE)    Demographics    Patient Name     NYCOLE KAWAHARA A Gender                Female    MR #  [id number]       Race [id number]           Caucasian    Ethnicity    Account #        [id number]         Room Number    Accession Number [id number]       Date of Study         09/06/2014    Date of Birth    1955/03/25      Referring Physician   Domingo Cocking DO  Bowlus Beverly Gust MD    Age              27 year(s)      Sonographer           Barbette Merino, RDCS    Interpreting          Domingo Cocking DO  Physician    Procedure    Type of Study    TTE procedure:ECHOCARDIOGRAM COMPLETE 2D W DOPPLER W COLOR.    Procedure Date  Date: 09/06/2014 Start: 10:10 AM    Study Location: Echo Lab  Technical Quality: Adequate visualization    Indications:Chest pain.    Additional Medical History:Hypertension, Diabetes    Patient Status: Routine    Height: 66 inches Weight: 181 pounds BSA: 1.92 m^2 BMI: 29.21 kg/m^2    BP: 116/76 mmHg    Conclusions    Summary  Systolic function was normal.  Ejection fraction is visually estimated at 55%.  Mildly dilated right ventricle.    Signature    ----------------------------------------------------------------  Electronically signed by Domingo Cocking DO  (Interpreting  physician) on 09/06/2014 at 06:28 PM  ----------------------------------------------------------------    Findings    Mitral Valve  Structurally normal mitral valve.  Trace mitral regurgitation is present.    Aortic Valve  Structurally normal aortic valve.    Tricuspid Valve  Tricuspid valve is structurally normal.  trivial tricuspid regurgitation.    Pulmonic Valve  Pulmonic valve is structurally normal.    Left Atrium  Normal size left atrium.    Left Ventricle  Systolic function was normal.  Ejection fraction is visually estimated at 55%.    Right Atrium  The right atrium is of normal size.    Right Ventricle  Mildly dilated right ventricle.    Pericardial Effusion  There is a trivial circumferential pericardial effusion noted.    M-Mode/2D Measurements & Calculations    LV Diastolic    LV Systolic Dimension: 2.7  AV Cusp Separation: 1.6 cmLA  Dimension: 3.8  cm                          Dimension: 2.9 cmAO Root  cm              LV Volume Diastolic: 62 ml  Dimension: 2.4 cm  LV FS:29 %      LV Volume Systolic: 27 ml  LV PW           LV EDV/LV EDV Index: 62  Diastolic: 0.8  ml/32 m^2LV ESV/LV ESV  cm              Index: 27 ml/14 m^2         RV Diastolic Dimension: 3 cm  Septum          EF Calculated: 56.5 %  Diastolic: 0.7  LA/Aorta: 1.21  cm    Doppler Measurements & Calculations    MV Peak E-Wave: 69.6 cm/s   AV Peak Velocity: 142  LVOT Peak Velocity: 105  MV Peak A-Wave: 49.9 cm/s   cm/s                   cm/s  MV E/A Ratio: 1.39          AV Peak Gradient: 8.07 LVOT Peak Gradient: 4  MV Peak Gradient: 1.94 mmHg mmHg                   mmHg    MV Deceleration Time: 144                          TV Peak E-Wave: 52.8  msec                                               cm/s  TV Peak A-Wave: 53.3  cm/s  MV E' Septal Velocity: 8.48  cm/s                                               TV Peak Gradient: 1.12  MV A' Septal Velocity: 13.5 AV DVI (Vmax):0.74     mmHg  cm/s  MV E'  Lateral Velocity:                            PV Peak Velocity: 69.1  13.6 cm/s                                          cm/s  MV A' Lateral Velocity:                            PV Peak Gradient: 1.91  11.6 cm/s                                          mmHg  E/E' septal: 8.21  E/E' lateral: 5.12    [id number]%3d%3d      STRESS: 10/28/16  Cardiac Perfusion Imaging      Demographics      Patient Name    ADYSSON REVELLE Gender                 Female      MR #            [id number]       Race                   Caucasian                                      Ethnicity      Account #       [id number]  Room Number      Accession       [id number]       Date of study          10/28/2016   Number      Date of Birth   1955/04/03      Referring Physician    Bowlus Beverly Gust MD      Age             43 year(s)      NM Technologist        Carmina Miller CNMT      Stress Staff    Fabio Pierce,   Interpreting           Nallu, Kishore MD                   RN              Cardiologist      The procedure was explained in detail to the patient. Risks,   complications and alternative treatments were reviewed. Written consent   was obtained.     Procedure  Procedure Type:      Nuclear Stress Test:Pharmacological, Regadenoson     Indications: Chest pain.  ??  Medical History:Patient history obtained by: Kathaleen Bury RN.  Chest pain status: chest pain .  ??   Risk Factors      The patient risk factors include:treated hypercholesterolemia and treated   hypertension.     Stress Protocols      Resting ECG   Sinus rhythm no ST-T changes      Resting HR:86 bpm                Resting BP:114/64 mmHg     Stress Protocol:Pharmacologic - Lexiscan  +--------+-----+------+-------+-----------+--+----------+------+-----------+  !Stage # !Time !Dosage!Heart  !Blood      !CP!Pain      !Pain  !Pain Action!  !        !     !      !Rate    !Pressure   !  !Location  !Type  !           !  +--------+-----+------+-------+-----------+--+----------+------+-----------+  !1.0     !01:00!      !96     !114/64     !  !          !      !           !  +--------+-----+------+-------+-----------+--+----------+------+-----------+  !2.0     !02:00!      !106    !118/56     !  !          !      !           !  +--------+-----+------+-------+-----------+--+----------+------+-----------+  !3.0     !03:00!      !105    !114/58     !  !          !      !           !  +--------+-----+------+-------+-----------+--+----------+------+-----------+  !Recovery!01:00!      !112    !126/78     !  !          !      !           !  +--------+-----+------+-------+-----------+--+----------+------+-----------+  !Recovery!02:00!      !114    !131/76     !  !          !      !           !  +--------+-----+------+-------+-----------+--+----------+------+-----------+  !  Recovery!03:00!      !112    !128/77     !  !          !      !           !  +--------+-----+------+-------+-----------+--+----------+------+-----------+  !Recovery!04:00!      !101    !125/76     !  !          !      !           !  +--------+-----+------+-------+-----------+--+----------+------+-----------+  !Recovery!05:00!      !98     !120/65     !  !          !      !           !  +--------+-----+------+-------+-----------+--+----------+------+-----------+  ??   Peak HR:114 bpm                 HR response: Appropriate   Peak BP:131/76 mmHg             BP response: Normal Resting BP with   Predicted HR: 162 bpm           appropriate response to Stress   % of predicted HR: 70           HR/BP product:[id number]   Test duration: 5 min   Reason for termination:Protocol   completed      ECG Findings   Non-diagnostic.      Arrhythmias   No stress induced arrhythmia.      Symptoms   No chest pain during the stress.      Imaging Protocols      Rest                                Stress      Isotope:Tc-60m Sestamibi            Isotope:  Tc-3m Sestamibi   Isotope dose:10 mCi                 Isotope dose:32.7 mCi   Date:10/28/2016 07:10               Date:10/28/2016 08:55      Technique:           SPECT          Technique:           Gated                                                            SPECT     Imaging Results:Calculated gated LVEF 83 %.  The T.I.D. ratio was 1.08 .  Myocardial perfusion imaging is not suggestive for myocardial ischemia.  This study was negative for ischemia.  ??   Conclusions      Summary   Lexiscan EKG stress test is not suggestive for ischemia.   Calculated gated LVEF 83 %.   The T.I.D. ratio was 1.08 .   Myocardial perfusion imaging is not suggestive for myocardial ischemia.   This study was negative for ischemia.      Recommendation   Clinical correlation is recommended.   Aggressive risk factor management.  Medical management.      Signatures      ----------------------------------------------------------------   Electronically signed by Waymon AmatoNallu, Kishore MD (Interpreting   Cardiologist) on 10/28/2016 at 17:19   ----------------------------------------------------------------     CATH: None    Assessment/Plan       Diagnosis Orders   1. Essential hypertension     2. Pulsatile tinnitus of right ear       Pulsatile tinnitus right ear  Dizziness  HTN  HLD  Pre-op evaluation    Recent EKG reviewed - sinus rhythm, no acute abnormalities  BP elevated today - recommend monitoring at home  Proceed as scheduled with MRA head and Carotid US  Will check Echo  Consider referral to vestibular rehab  Patient at lower risk to proceed with surgery as scheduled  The patient is asked to make an attempt to improve diet and exercise patterns to aid in medical management of this problem.  Advised more plant based nutrition/meditarrean diet   Advised patient to call office or seek immediate medical attention if there is any new onset of  any chest pain, sob, palpitations, lightheadedness, dizziness, orthopnea, PND or pedal edema.   All  medication side effects were discussed in details.    Thank youfor allowing me to participate in the care of this patient.   Please do not hesitate to contact me for any further questions.     Return in about 6 months (around 10/18/2020), or if symptoms worsen or fail to improve, for Review testing, Regular follow up.       Electronically signed by Waymon AmatoKishore Nallu, MD  04/17/2020 at 8:20 AM EDT

## 2020-04-17 NOTE — Patient Instructions (Signed)
You may receive a survey regarding the care you received during your visit.  Your input is valuable to us.  We encourage you to complete and return your survey.  We hope you will choose us in the future for your healthcare needs.

## 2020-04-22 ENCOUNTER — Inpatient Hospital Stay: Admit: 2020-04-22 | Payer: PRIVATE HEALTH INSURANCE

## 2020-04-22 DIAGNOSIS — R519 Headache, unspecified: Secondary | ICD-10-CM

## 2020-04-22 DIAGNOSIS — I10 Essential (primary) hypertension: Secondary | ICD-10-CM

## 2020-04-22 LAB — ECHOCARDIOGRAM COMPLETE 2D W DOPPLER W COLOR: Left Ventricular Ejection Fraction: 58

## 2020-04-29 ENCOUNTER — Inpatient Hospital Stay: Payer: PRIVATE HEALTH INSURANCE

## 2020-04-29 ENCOUNTER — Encounter

## 2020-05-12 ENCOUNTER — Encounter

## 2020-05-12 MED ORDER — HYDROCODONE-ACETAMINOPHEN 5-325 MG PO TABS
5-325 MG | ORAL_TABLET | Freq: Four times a day (QID) | ORAL | 0 refills | Status: DC | PRN
Start: 2020-05-12 — End: 2020-05-21

## 2020-05-12 NOTE — Telephone Encounter (Signed)
OARRS reviewed. UDS: + for  Zolpidem, Hydrocodone, Fluoxetine, Lorazepam, Zolpidem-Carboxyl.   Last seen: 03/03/2020. Follow-up: 06/02/2020

## 2020-05-12 NOTE — Telephone Encounter (Signed)
Theresa Vargas called requesting a refill on the following medications:  Requested Prescriptions     Pending Prescriptions Disp Refills   ??? HYDROcodone-acetaminophen (NORCO) 5-325 MG per tablet       Pharmacy verified: [facility]  .pv      Date of last visit: 03/03/2020  Date of next visit (if applicable): 06/02/2020

## 2020-05-15 NOTE — Telephone Encounter (Signed)
Okay thank you. Yes hold our medications while taking medications from Ortho. Will need to wait those 14 days

## 2020-05-15 NOTE — Telephone Encounter (Signed)
Was called by [facility]. Having 2 scripts per ortho prescribed for 7 days each of Percocet. We had sent script refill for Norco. Had them cancel as will now not get refill for additional 14 days  Or 4/27. LVM with pt. Explaining why no refill till 4/27.

## 2020-05-21 ENCOUNTER — Encounter

## 2020-05-22 MED ORDER — HYDROCODONE-ACETAMINOPHEN 5-325 MG PO TABS
5-325 MG | ORAL_TABLET | Freq: Four times a day (QID) | ORAL | 0 refills | Status: DC | PRN
Start: 2020-05-22 — End: 2020-06-23

## 2020-05-22 NOTE — Telephone Encounter (Signed)
OARRS reviewed. UDS: + for hydrocodone, prozac, ativan and ambien .   Last seen: 03/03/2020. Follow-up:   Future Appointments   Date Time Provider Department Center   05/23/2020  1:45 PM Dennis Bast, PT [facility] PT [facility] HOD   06/02/2020 12:30 PM Simone Curia, APRN - CNP [facility] [place]   06/11/2020  1:45 PM Shauna Hugh, Teena Irani [facility]   10/23/2020  1:00 PM Waymon Amato, MD [facility] [place]

## 2020-05-22 NOTE — Telephone Encounter (Signed)
Thanks for correction reset date for 4/27 due to pt. Getting Percocet.

## 2020-05-23 ENCOUNTER — Inpatient Hospital Stay: Admit: 2020-05-23 | Payer: PRIVATE HEALTH INSURANCE

## 2020-05-23 DIAGNOSIS — I10 Essential (primary) hypertension: Secondary | ICD-10-CM

## 2020-05-23 NOTE — Progress Notes (Signed)
** PLEASE SIGN, DATE AND TIME CERTIFICATION BELOW AND RETURN TO [facility] (FAX #: [phone]).  ATTEST/CO-SIGN IF ACCESSING VIA INBASKET.  THANK YOU.**    I certify that I have examined the patient below and determined that Physical Medicine and Rehabilitation service is necessary and that I approve the established plan of care for up to 90 days or as specifically noted.  Attestation, signature or co-signature of physician indicates approval of certification requirements.    ________________________ ____________ __________  Physician Signature   Date   Time  [facility]  PHYSICAL THERAPY  [x]  VESTIBULAR EVALUATION  []  DAILY NOTE []  PROGRESS NOTE []  DISCHARGE NOTE    [x]  [facility] [place]   []  [facility]    []  [facility]   []  [facility]    Date: 05/23/2020  Patient Name:  Theresa Vargas  DOB: 1955-12-11  MRN:  CSN:    Referring Practitioner , MD   Diagnosis Pulsatile tinnitus, right ear [phone].A1]    Treatment Diagnosis Dizziness, impaired VOR, impaired balance, ringing in ears, decreased cervical ROM   Date of Evaluation 05/23/20   Additional Pertinent History HTN, dizziness, vertigo, diabetes, anxiety, depression, panic disorder       Functional Outcome Measure Used DHI    Functional Outcome Score 36/100 (05/23/20)       Insurance: Primary: Payor: CIGNA /  /  / ,   Secondary:    Authorization Information: PRE CERTIFICATION REQUIRED: NO  INSURANCE THERAPY BENEFIT:  ALLOWED 90 VISITS PT/OT/ST COMBINED PER CALENDAR YEAR, NONE OF THESE VISITS HAVE BEEN USED  AQUATIC THERAPY COVERED: YES  MODALITIES COVERED:  YES, YES MASSAGE   Visit # 1, 1/10 for progress note   Visits Allowed: 90 visits   Recertification Date: 07/18/20   Physician Follow-Up:    Physician Orders:    History of Present Illness: Patient reports that she has been getting dizziness for about 4-5 months and has ringing in her L ear.  Patient reports that she saw ENT and she had tubes placed in both ears. Patient then saw cardiologist and her heart work up was normal. Patient was then referred to PT for dizziness and ringing in her ears. Patient reports that she will get thumping in her R ear. Patient reports that her dizziness will happen with sit to stands and will be random. Patient denies any dizziness with getting into or out of bed. Patient reports that her dizziness feels more like lightheadedness verses room spinning dizziness. Patient reports that her dizzines would last for hours if she did not take medication. Patient reports that the dizziness will resolve with medication. Patient denies any visual changes.      SUBJECTIVE: Patient denies any falls this year. Patient had ultrasound on carotid that was normal. 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 Patient did have a L shoulder bicep repair done on 05/01/20. Patient does have a cervical fusion. Patient has had vertigo in the past and she does not feel that this dizziness is the same.     Social/Functional History and Current Status:  Medications and Allergies have been reviewed and are listed on Medical History Questionnaire.    Theresa Vargas lives with spouse in a multiple floor home with a level surface to enter.    Task Previous Current   ADL???s  Independent Modified Independent   IADL's  Independent Modified Independent   Ambulation Independent Independent   Transfers Independent Independent   Recreation Education administrator Independent Independent   Driving Active driver Active driver   Work Retired  Retired       Precautions:  Positive L vertebra artery test    Pain 0/10 pain in neck   Neck ROM NECK RANGE OF MOTION   Flexion 23   Extension 15   Rotation Right 50   Rotation Left 33   Sidebending Right 15   Sidebending Left 15   Neck Range of Motion is WFL  []       Vertebral  Artery Testing L side patient had nausea and dizziness-positive  R side negative    Visual Field Convergence normal   Smooth pursuits-abnormal, nystagmus noted   Saccades-normal    Oculomotor/VOR VOR: positive, dizziness and nystagmus noted   VOR thrust: positive B, dizziness and nystagmus noted    Balance Rhomberg on foam eyes open-instability laterally able to perform for ~10 sec before LOB  Rhomberg on foam eyes closed-instability laterally able to perform for ~3 sec before LOB   Gait Patient ambulates with no AD with decreased cadence, no LOB noted.    Special Testing BPPV Not tested due to L vertebral artery positive    Hearing/Ear Pain Patient does report that she has hearing loss in L ear that she has noticed. She was referred to audiology and she had been recommended to get hearing aides. ENT did not see that she had hearing loss. She did not opt to get hearing aides due to insurance not covering it.      TREATMENT   Precautions: Positive L vertebra artery test    Pain: 0/10 pain     ???X??? in shaded column indicates activity completed today   Modalities Parameters/  Location  Notes                     Manual Therapy Time/Technique  Notes                     Exercise/Intervention   Notes   1 time viewing VOR-horizontal and vertical  1 x 60 sec  x Educated for HEP. Handout provided.                                                                           Specific Interventions Next Treatment: VOR- 1 time viewing, 2 time viewing, balance exercises,     Activity/Treatment Tolerance:  [x]   Patient tolerated treatment well  []   Patient limited by fatigue  []   Patient limited by pain   []   Patient limited by medical complications  []   Other:     Assessment: 65 year old female presents with tinnitus in L ear and dizziness. 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 Patient has decreased cervical ROM, forward head and rounded shoulder posture, dizziness, impaired VOR, L ear tinnitus, and impaired balance that limits her  ability to perform daily tasks. Patient would benefit from skilled PT to improve dizziness, VOR, balance to allow improved functional mobility. Patient educated on benefit of PT and PT POC with patient in agreement.      Body Structures/Functions/Activity  Limitations:impaired activity tolerance, impaired balance, impaired ROM, pain and abnormal posture  Prognosis: good    GOALS:  Patient Goal: to improve dizziness     Short Term Goals:  Time Frame: 4 weeks  1. Patient will be able to perform rhomberg on firm eyes open for 20 seconds without LOB.  2. Patient will have negative VOR testing to improve dizziness with head movements.   3. Patient will report 60% improvement in dizziness to allow improved ability to perform daily tasks.     Long Term Goals:  Time Frame: 8 weeks  1. Patient will improved DHI score from 36/100 to 20/100 to allow improved ability to perform daily tasks.   2. Patient will be independent with HEP in order to prevent re-injury and improve functional abilities.     Patient Education:   [x]   HEP/Education Completed: Plan of Care, Goals, benefit of PT, attendance policy, HEP handout   ??? Medbridge Access Code:  []   No new Education completed  []   Reviewed Prior HEP      [x]   Patient verbalized and/or demonstrated understanding of education provided.  []   Patient unable to verbalize and/or demonstrate understanding of education provided.  Will continue education.  []   Barriers to learning:     PLAN:  Treatment Recommendations: Range of Motion, Gait Training, , Neuromuscular Re-education, Manual Therapy - Soft Tissue Mobilization, Manual Therapy - Joint Manipulation, Pain Management, Home Exercise Program, Patient Education, , Vestibular Rehabilitation and Modalities    [x]   Plan of care initiated.  Plan to see patient 1-2 times per week for 8 weeks to address the treatment planned outlined above.  []   Continue with current plan of care  []   Modify plan of  care as follows:    []   Hold pending physician visit  []   Discharge    Time In 1348   Time Out 1445   Timed Code Minutes: 10 min   Total Treatment Time: 57 min       Electronically Signed by: , PT

## 2020-05-26 ENCOUNTER — Inpatient Hospital Stay: Admit: 2020-05-26 | Payer: PRIVATE HEALTH INSURANCE

## 2020-05-26 NOTE — Progress Notes (Signed)
[facility]  PHYSICAL THERAPY  []  VESTIBULAR EVALUATION  [x]  DAILY NOTE []  PROGRESS NOTE []  DISCHARGE NOTE    [x]  [facility] [place]   []  [facility]    []  [facility]   []  [facility]    Date: 05/26/2020  Patient Name:  Theresa Vargas  DOB: Mar 09, 1955  MRN:  CSN:    Referring Practitioner , MD   Diagnosis Pulsatile tinnitus, right ear [phone].A1]    Treatment Diagnosis Dizziness, impaired VOR, impaired balance, ringing in ears, decreased cervical ROM   Date of Evaluation 05/23/20   Additional Pertinent History HTN, dizziness, vertigo, diabetes, anxiety, depression, panic disorder       Functional Outcome Measure Used DHI    Functional Outcome Score 36/100 (05/23/20)       Insurance: Primary: Payor: CIGNA /  /  / ,   Secondary:    Authorization Information: PRE CERTIFICATION REQUIRED: NO  INSURANCE THERAPY BENEFIT:  ALLOWED 90 VISITS PT/OT/ST COMBINED PER CALENDAR YEAR, NONE OF THESE VISITS HAVE BEEN USED  AQUATIC THERAPY COVERED: YES  MODALITIES COVERED:  YES, YES MASSAGE   Visit # 2, 2/10 for progress note   Visits Allowed: 90 visits   Recertification Date: 07/18/20   Physician Follow-Up: ENT 1 year. Dr. [phone] beginning of May.    Physician Orders:    History of Present Illness: Patient reports that she has been getting dizziness for about 4-5 months and has ringing in her L ear. Patient reports that she saw ENT and she had tubes placed in both ears. Patient then saw cardiologist and her heart work up was normal. Patient was then referred to PT for dizziness and ringing in her ears. Patient reports that she will get thumping in her R ear. Patient reports that her dizziness will happen with sit to stands and will be random. Patient denies any dizziness with getting into or out of bed. Patient reports that her dizziness feels more like lightheadedness verses room spinning dizziness. Patient reports that her dizzines would  last for hours if she did not take medication. Patient reports that the dizziness will resolve with medication. Patient denies any visual changes.      SUBJECTIVE: Patient reports she has been doing x1 viewing 2-3x/day, sitting, and going well. On Friday, pt got up from couch and carried chair to garage to sit outside and became dizzy. Dizzy described as lightheaded and movement makes her feel unsteady. Pt takes 1/2 pill of OTC Bonnine which helps with dizziness. Pt also takes Ativan for anxiety when she gets dizzy. Pt has been walking outside and after 5 minutes her left hurts.    OBJECTIVE:  + left VBI- speech slows after 15 sec  + smooth pursuit B  - saccades  + head thrust B  TUG 11 sec  Functional reach 8 inches  TREATMENT   Precautions: Positive L vertebral artery test - NO DIX HALLPIKE   Pain:     ???X??? in shaded column indicates activity completed today   Modalities Parameters/  Location  Notes                     Manual Therapy Time/Technique  Notes                     Exercise/Intervention   Notes   1 time viewing VOR, seated-horizontal and vertical 15-20 sec  x Cues to move letter closer  to face, smaller range, keep focus on letter   x1 viewing standing normal stance with head turns and nods 1 min ea  x                                                                     Specific Interventions Next Treatment: VOR- 1 time viewing, 2 time viewing, balance exercises    Activity/Treatment Tolerance:  [x]   Patient tolerated treatment well  []   Patient limited by fatigue  []   Patient limited by pain   []   Patient limited by medical complications  []   Other:     Assessment: Pt demonstrate positive VBI test to left but diagnostics have been performed. Oculomotor exam showed 4/6 positive tests indicating vestibular dysfunction. Reviewed x1 viewing and required cues to move letter closer to face, and smaller head movement. Progressed x1 viewing to standing with good technique and minimal sway. Instructed patient to  discuss MRA of head with Dr. at follow up, since she has positive vertebral artery test.     GOALS:  Patient Goal: to improve dizziness     Short Term Goals:  Time Frame: 4 weeks  1. Patient will be able to perform rhomberg on firm eyes open for 20 seconds without LOB.  2. Patient will have negative VOR testing to improve dizziness with head movements.   3. Patient will report 60% improvement in dizziness to allow improved ability to perform daily tasks.     Long Term Goals:  Time Frame: 8 weeks  1. Patient will improved DHI score from 36/100 to 20/100 to allow improved ability to perform daily tasks.   2. Patient will be independent with HEP in order to prevent re-injury and improve functional abilities.     Patient Education:   [x]   HEP/Education Completed: Plan of Care, Goals, benefit of PT, attendance policy, HEP handout   ??? Medbridge Access Code:  []   No new Education completed  []   Reviewed Prior HEP      [x]   Patient verbalized and/or demonstrated understanding of education provided.  []   Patient unable to verbalize and/or demonstrate understanding of education provided.  Will continue education.  []   Barriers to learning:     PLAN:  Treatment Recommendations: Range of Motion, Gait Training, , Neuromuscular Re-education, Manual Therapy - Soft Tissue Mobilization, Manual Therapy - Joint Manipulation, Pain Management, Home Exercise Program, Patient Education, Safety Education and Training, Vestibular Rehabilitation and Modalities    []   Plan of care initiated.  Plan to see patient 1-2 times per week for 8 weeks to address the treatment planned outlined above.  [x]   Continue with current plan of care  []   Modify plan of care as follows:    []   Hold pending physician visit  []   Discharge    Time In 1130   Time Out 1210   Timed Code Minutes: 40 min   Total Treatment Time: 40 min       Electronically Signed by: , PT

## 2020-05-29 ENCOUNTER — Inpatient Hospital Stay: Payer: PRIVATE HEALTH INSURANCE

## 2020-05-29 DIAGNOSIS — E119 Type 2 diabetes mellitus without complications: Secondary | ICD-10-CM

## 2020-05-29 LAB — LIPID PANEL
Cholesterol, Total: 145 mg/dL (ref 100–199)
HDL: 33 mg/dL
LDL Calculated: 62 mg/dL
Triglycerides: 249 mg/dL — ABNORMAL HIGH (ref 0–199)

## 2020-05-29 LAB — HEMOGLOBIN A1C
AVERAGE GLUCOSE: 141 mg/dL — ABNORMAL HIGH (ref 70–126)
Hemoglobin A1C: 6.7 % — ABNORMAL HIGH (ref 4.4–6.4)

## 2020-05-29 LAB — COMPREHENSIVE METABOLIC PANEL
ALT: 27 U/L (ref 11–66)
AST: 24 U/L (ref 5–40)
Albumin: 4.1 g/dL (ref 3.5–5.1)
Alkaline Phosphatase: 111 U/L (ref 38–126)
BUN: 8 mg/dL (ref 7–22)
CO2: 24 meq/L (ref 23–33)
Calcium: 9.2 mg/dL (ref 8.5–10.5)
Chloride: 100 meq/L (ref 98–111)
Creatinine: 0.7 mg/dL (ref 0.4–1.2)
Glucose: 158 mg/dL — ABNORMAL HIGH (ref 70–108)
Potassium: 4.1 meq/L (ref 3.5–5.2)
Sodium: 138 meq/L (ref 135–145)
Total Bilirubin: 0.3 mg/dL (ref 0.3–1.2)
Total Protein: 6.9 g/dL (ref 6.1–8.0)

## 2020-05-29 LAB — ANION GAP: Anion Gap: 14 meq/L (ref 8.0–16.0)

## 2020-05-29 LAB — GLOMERULAR FILTRATION RATE, ESTIMATED: Est, Glom Filt Rate: 85 mL/min/{1.73_m2} — AB

## 2020-06-02 ENCOUNTER — Encounter: Admit: 2020-06-02 | Discharge: 2020-06-02 | Payer: PRIVATE HEALTH INSURANCE | Attending: Family

## 2020-06-02 DIAGNOSIS — M961 Postlaminectomy syndrome, not elsewhere classified: Secondary | ICD-10-CM

## 2020-06-02 NOTE — Progress Notes (Signed)
[facility] [place] [facility]  [facility]  [address]  [place] [address]  Dept: [phone]  Dept Fax: [phone]  Loc: [phone]    Visit Date: 06/02/2020    Functionality Assessment/Goals Worksheet     On a scale of 0 (Does not Interfere) to 10 (Completely Interferes)     1.  Which number describes how during the past week pain has interfered with       the following:  A.  General Activity:  4  B.  Mood: 4  C.  Walking Ability:  6  D.  Normal Work (Includes both work outside the home and housework):  4  E.  Relations with Other People:   2  F.  Sleep:   5  G.  Enjoyment of Life:   4    2.  Patient Prefers to Take their Pain Medications:     [x]   On a regular basis   [x]   Only when necessary    []   Does not take pain medications    3.  What are the Patient's Goals/Expectations for Visiting Pain Management?     []   Learn about my pain    [x]   Receive Medication   []   Physical Therapy     []   Treat Depression   [x]   Receive Injections    []   Treat Sleep   []   Deal with Anxiety and Stress   []   Treat Opoid Dependence/Addiction   []   Other:      HPI:   Theresa Vargas is a 65 y.o. female is here today for    Chief Complaint: Low back pain     HPI   3 month FU. Has pain in low back mainly localized left side- aching pain. Pain no too bad lately. Has been walking more for exercises every other day and states pain increases following walking. Walking 2 miles at a time.   Gets aching and sore pain in legs after the exercise/ walking.     Has surgery at [facility] left upper arm completed by Dr. on 05/01/2020- had surgery on left shoulder and bicep. Patient brought in op report and we reviewed it.  Has some pain in left bicep area- aching sore pain. She received Percocet post op and states that it was very effective and took all her pain away.     Patient currently working with therapy at [facility]   She is back on Norco QID prn which takes the edge  off but states not like Percocet.    Pain increases with bending, lifting, twisting , walking, standing, getting up and down and housework or working at job.    Medications reviewed. Patient denies side effects with medications. Patient states she is taking medications as prescribed. Shedenies receiving pain medications from other sources. She denies any ER visits since last visit.    Pain scale with out pain medications or at its worst is 7-8/10.  Pain scale with pain medications or at its best is 2/10.  Last dose of Norco was today   Drug screen reviewed from 03/03/2020 and was appropriate  Pill count completed  today and WNL: Yes      The patienthas No Known Allergies.      Subjective:      Review of Systems   Constitutional: Negative.    Musculoskeletal: Positive for arthralgias, back pain, gait problem, joint swelling and  myalgias.   Neurological: Positive for dizziness, weakness and numbness.       Objective:     Vitals:    06/02/20 1227   BP: 118/72   Weight: 196 lb (88.9 kg)   Height: 5' 6.5" (1.689 m)       Physical Exam  Constitutional:       General: She is not in acute distress.     Appearance: She is well-developed.   HENT:      Head: Normocephalic and atraumatic.      Right Ear: External ear normal.      Left Ear: External ear normal.      Nose: Nose normal.   Eyes:      Conjunctiva/sclera: Conjunctivae normal.      Pupils: Pupils are equal, round, and reactive to light.   Neck:      Thyroid: No thyromegaly.      Comments: Decreased range of motion with ear to shoulder both sides, tender posterior over bone   Cardiovascular:      Rate and Rhythm: Normal rate and regular rhythm.      Pulses: Normal pulses.      Heart sounds: Normal heart sounds. No murmur heard.  No friction rub. No gallop.    Pulmonary:      Effort: Pulmonary effort is normal. No respiratory distress.      Breath sounds: Normal breath sounds.   Abdominal:      General: Bowel sounds are normal.      Palpations: Abdomen is soft.    Musculoskeletal:         General: Tenderness present. No deformity.      Right shoulder: No tenderness. Normal range of motion.      Left shoulder: Tenderness and bony tenderness present. No swelling. Decreased range of motion. Decreased strength.      Left upper arm: No tenderness or bony tenderness.      Left elbow: Normal range of motion.      Left forearm: No tenderness or bony tenderness.      Cervical back: Neck supple. No tenderness.      Thoracic back: No tenderness. Normal range of motion.      Lumbar back: Tenderness and bony tenderness present. Decreased range of motion.        Back:       Right knee: Normal range of motion. No tenderness.      Left knee: Normal range of motion. No tenderness.   Skin:     General: Skin is warm and dry.      Findings: No erythema or rash.          Neurological:      General: No focal deficit present.      Mental Status: She is alert and oriented to person, place, and time.      Sensory: Sensory deficit present.      Motor: Weakness present. No atrophy or abnormal muscle tone.      Coordination: Coordination abnormal.      Gait: Gait normal.      Deep Tendon Reflexes: Reflexes are normal and symmetric.      Reflex Scores:       Tricep reflexes are 2+ on the right side and 2+ on the left side.       Bicep reflexes are 2+ on the right side and 2+ on the left side.       Brachioradialis reflexes are 2+ on the right side and 2+  on the left side.       Patellar reflexes are 2+ on the right side and 2+ on the left side.       Achilles reflexes are 2+ on the right side and 2+ on the left side.     Comments: Bilateral lower and right upper extremity 5/5.     + bilateral SLR at 60 degrees        Psychiatric:         Behavior: Behavior normal.         Thought Content: Thought content normal.       FABER  Patricks test  positive  Yeoman's  positive  Gaenslen's  positive       Assessment:     1. Lumbar post-laminectomy syndrome    2. Spinal stenosis of lumbar region with neurogenic  claudication    3. Spondylosis of lumbar region without myelopathy or radiculopathy    4. SI (sacroiliac) pain    5. Sacroiliac inflammation (HCC)    6. Chronic pain syndrome    7. Chronic, continuous use of opioids            Plan:      ?? OARRS reviewed. Current MED: 20.00  ?? Patient was offered naloxone for home.   ?? Discussed long term side effects of medications, tolerance, dependency and addiction.  ?? Previous UDS reviewed  ?? UDS preformed today for compliance.  ?? Patient told can not receive any pain medications from any other source.  ?? No evidence of abuse, diversion or aberrant behavior.  ??? Medications and/or procedures to improve function and quality of life- patient understanding with this and that may not be pain free  ??? Discussed with patient about safe storage of medications at home  ??? Discussed possible weaning of medication dosing dependent on treatment/procedure results.   ??? Discussed with patient about risks with procedure including infection, reaction to medication, increased pain, or bleeding.  ?? Procedure notes reviewed in detail.??  ?? Received 70% to 80% relief of low back pain and SI pain from SI MBB # 1 for over a month but states insurance did not cover it. Will repeat when she changes insurance or if they cover them in future.   ??? Reviewed Surgery notes of left shoulder and bicep. Patient continues to heal and continues therapies   ?? Discussed updating imaging (MRI and SCS again). Patient does not want. Also discussed surgery referral again   ?? Continue Norco 5/325 QID prn-??filled??05/28/2020 and has plenty. Patient is compliant     Meds. Prescribed:   No orders of the defined types were placed in this encounter.      Return in about 3 months (around 09/02/2020), or if symptoms worsen or fail to improve, for follow up  for medications.               Electronically signed by Simone Curia, APRN - CNP on5/03/2020 at 12:47 PM

## 2020-06-05 ENCOUNTER — Inpatient Hospital Stay: Admit: 2020-06-05 | Payer: PRIVATE HEALTH INSURANCE

## 2020-06-05 DIAGNOSIS — I10 Essential (primary) hypertension: Secondary | ICD-10-CM

## 2020-06-05 NOTE — Progress Notes (Signed)
[facility]  PHYSICAL THERAPY  []  VESTIBULAR EVALUATION  [x]  DAILY NOTE []  PROGRESS NOTE []  DISCHARGE NOTE    [x]  [facility] [place]   []  [facility]    []  [facility]   []  [facility]    Date: 06/05/2020  Patient Name:  Theresa Vargas  DOB: Feb 01, 1956  MRN:  CSN:    Referring Practitioner , MD   Diagnosis Pulsatile tinnitus, right ear [phone].A1]    Treatment Diagnosis Dizziness, impaired VOR, impaired balance, ringing in ears, decreased cervical ROM   Date of Evaluation 05/23/20   Additional Pertinent History HTN, dizziness, vertigo, diabetes, anxiety, depression, panic disorder       Functional Outcome Measure Used DHI    Functional Outcome Score 36/100 (05/23/20)       Insurance: Primary: Payor: C[employer] /  /  / ,   Secondary:    Authorization Information: PRE CERTIFICATION REQUIRED: NO  INSURANCE THERAPY BENEFIT:  ALLOWED 90 VISITS PT/OT/ST COMBINED PER CALENDAR YEAR, NONE OF THESE VISITS HAVE BEEN USED  AQUATIC THERAPY COVERED: YES  MODALITIES COVERED:  YES, YES MASSAGE   Visit # 3, 3/10 for progress note   Visits Allowed: 90 visits   Recertification Date: 07/18/20   Physician Follow-Up: ENT 1 year. Dr. [id number] beginning of May.    Physician Orders:    History of Present Illness: Patient reports that she has been getting dizziness for about 4-5 months and has ringing in her L ear. Patient reports that she saw ENT and she had tubes placed in both ears. Patient then saw cardiologist and her heart work up was normal. Patient was then referred to PT for dizziness and ringing in her ears. Patient reports that she will get thumping in her R ear. Patient reports that her dizziness will happen with sit to stands and will be random. Patient denies any dizziness with getting into or out of bed. Patient reports that her dizziness feels more like lightheadedness verses room spinning dizziness. Patient reports that her dizzines would  last for hours if she did not take medication. Patient reports that the dizziness will resolve with medication. Patient denies any visual changes.      SUBJECTIVE: Patient reports that she feels the same since start of PT. Patient reports that the exercises are making her dizzy. Patient reports that she does not notice the dizziness much until she does the exercises and then she is very dizzy and has to take medication. Patient reports that white noise does improve her ringing in the ear symptom.     OBJECTIVE:  MCTSIB:   Firm eyes open-30 seconds no deviation  Firm eyes closed- 30 seconds no deviation  Foam eyes open-30 seconds no deviation  Foam eyes closed- 2 seconds prior to major LOB, only able to complete 2 seconds     TREATMENT   Precautions: Positive L vertebral artery test - NO DIX HALLPIKE   Pain:     ???X??? in shaded column indicates activity completed today   Modalities Parameters/  Location  Notes                     Manual Therapy Time/Technique  Notes                     Exercise/Intervention   Notes   1 time viewing VOR, seated-horizontal and vertical 1 min ea  x Cues to move letter closer  to face, smaller range, keep focus on letter   x1 viewing standing normal stance with head turns and nods 1 min ea  x                                                                     Specific Interventions Next Treatment: VOR- 1 time viewing, 2 time viewing, balance exercises    Activity/Treatment Tolerance:  [x]   Patient tolerated treatment well  []   Patient limited by fatigue  []   Patient limited by pain   []   Patient limited by medical complications  []   Other:     Assessment: Patient continues to have the same symptoms with no change. Patient presents today with increase in symptoms/dizziness during the 1 time viewing exercises despite performing the exercises correctly. Patient was educated on holding on 1 time viewing exercise this next week as she had increase in symptoms. Patient will be re-assessed next  session.     GOALS:  Patient Goal: to improve dizziness     Short Term Goals:  Time Frame: 4 weeks  1. Patient will be able to perform rhomberg on firm eyes open for 20 seconds without LOB.  2. Patient will have negative VOR testing to improve dizziness with head movements.   3. Patient will report 60% improvement in dizziness to allow improved ability to perform daily tasks.     Long Term Goals:  Time Frame: 8 weeks  1. Patient will improved DHI score from 36/100 to 20/100 to allow improved ability to perform daily tasks.   2. Patient will be independent with HEP in order to prevent re-injury and improve functional abilities.     Patient Education:   [x]   HEP/Education Completed: hold on 1 time viewing exercise as it caused increase in symptoms   ??? Medbridge Access Code:  []   No new Education completed  []   Reviewed Prior HEP      [x]   Patient verbalized and/or demonstrated understanding of education provided.  []   Patient unable to verbalize and/or demonstrate understanding of education provided.  Will continue education.  []   Barriers to learning:     PLAN:  Treatment Recommendations: Range of Motion, Gait Training, , Neuromuscular Re-education, Manual Therapy - Soft Tissue Mobilization, Manual Therapy - Joint Manipulation, Pain Management, Home Exercise Program, Patient Education, Safety Education and Training, Vestibular Rehabilitation and Modalities    []   Plan of care initiated.  Plan to see patient 1-2 times per week for 8 weeks to address the treatment planned outlined above.  [x]   Continue with current plan of care  []   Modify plan of care as follows:    []   Hold pending physician visit  []   Discharge    Time In 1431   Time Out 1500   Timed Code Minutes: 29 min   Total Treatment Time: 29 min       Electronically Signed by: , PT

## 2020-06-11 ENCOUNTER — Encounter: Admit: 2020-06-11 | Discharge: 2020-06-11 | Payer: PRIVATE HEALTH INSURANCE | Attending: Physician Assistant

## 2020-06-11 DIAGNOSIS — Z9989 Dependence on other enabling machines and devices: Secondary | ICD-10-CM

## 2020-06-11 NOTE — Progress Notes (Signed)
[facility]      Theresa Vargas         [id number]  06/11/2020   Patient is her today for a 1 year sleep shrme follow up     Pt of Dr. Jenetta Downer    PAP Download:   Original or initial AHI: 14.3     Date of initial study: 09/20/14      Compliant  100%     Noncompliant 0 %     PAP Type air senseLevel  7   Avg Hrs/Day 9 hours and 37 min  AHI: 0.8   Recorded compliance dates , 04/14/20  to 05/13/20   Machine/Mfg:   [x]  ResMed    []  Respironics/Dreamstation   Interface:   [x]  Nasal    []  Nasal pillows   []  FFM      Provider:      []  [facility]     [x] [employer]     []  [employer]    []  [facility]    []  [facility]               []  [facility]      []  [employer]    []  [facility]:      [x]  Other    Neck Size: 13.5 Mallampati Mallampati 4  ESS:  0  SAQLI: 55    Here is a scan of the most recent download:            Presentation:   Theresa Vargas presents for sleep medicine follow up for obstructive sleep apnea  Since the last visit, Theresa Vargas is doing well with PAP. She is sleeping well with Ambien per psych.  Also on Ativan as needed for anxiety.     Equipment issues:  The pressure is  acceptable, the mask is acceptable     Sleep issues:  Do you feel better? Yes  More rested?Yes   Better concentration? yes    Progress History:   Since last visit any new medical issues? Shoulder surgery, tubes in ER  New ER or hospital visits? No  Any new or changes in medicines? No  Any new sleep medicines? No    Review of Systems -   Review of Systems   Constitutional: Negative for activity change, appetite change, chills and fever.   HENT: Negative for congestion and postnasal drip.    Eyes: Negative.    Respiratory: Negative for cough, chest tightness, shortness of breath, wheezing and stridor.    Cardiovascular: Negative for chest pain and leg swelling.   Gastrointestinal: Negative for diarrhea and nausea.   Endocrine: Negative.    Genitourinary: Negative.    Musculoskeletal: Positive for arthralgias. Negative for back  pain.   Skin: Negative.    Allergic/Immunologic: Negative.    Neurological: Negative.  Negative for dizziness and light-headedness.   Psychiatric/Behavioral: Negative.    All other systems reviewed and are negative.       Physical Exam:    BMI:  Body mass index is 30.59 kg/m.    Wt Readings from Last 3 Encounters:   06/11/20 192 lb 6.4 oz (87.3 kg)   06/02/20 196 lb (88.9 kg)   04/17/20 196 lb 9.6 oz (89.2 kg)     Weight stable / unchanged  Vitals: BP 132/78 (Site: Right Upper Arm, Position: Sitting, Cuff Size: Large Adult)   Pulse 101   Temp 97.5 F (36.4 C)   Ht 5' 6.5" (1.689 m)   Wt 192 lb 6.4 oz (87.3  kg)   SpO2 96% Comment: on r/a  BMI 30.59 kg/m       Physical Exam  Constitutional:       Appearance: Normal appearance. She is normal weight.   HENT:      Head: Normocephalic and atraumatic.      Right Ear: External ear normal.      Left Ear: External ear normal.      Nose: Nose normal.   Eyes:      Extraocular Movements: Extraocular movements intact.      Conjunctiva/sclera: Conjunctivae normal.      Pupils: Pupils are equal, round, and reactive to light.   Pulmonary:      Effort: Pulmonary effort is normal.   Musculoskeletal:      Cervical back: Normal range of motion and neck supple.   Neurological:      General: No focal deficit present.      Mental Status: She is alert and oriented to person, place, and time.   Psychiatric:         Attention and Perception: Attention and perception normal.         Mood and Affect: Mood and affect normal.         Speech: Speech normal.         Behavior: Behavior normal. Behavior is cooperative.         Thought Content: Thought content normal.         Cognition and Memory: Cognition normal.         Judgment: Judgment normal.           ASSESSMENT/DIAGNOSIS     Diagnosis Orders   1. OSA on CPAP     2. Obesity (BMI 30-39.9)     3. Other insomnia              Plan   Do you need any equipment today? Yes update supplies  - On Ambien per psych  - Download reviewed and  discussed with patient  - She  was advised to continue current positive airway pressure therapy with above described pressure.   - She  advised to keep good compliance with current recommended pressure to get optimal results and clinical improvement  - Recommend 7-9 hours of sleep with PAP  - She was advised to call DME company regarding supplies if needed.   -She call my office for earlier appointment if needed for worsening of sleep symptoms.   - She was instructed on weight loss  - Pacey was educated about my impression and plan. Patient verbalizesunderstanding.  We will see Theresa Vargas back in: 1 year with download    Information added by my medical assistant/LPN was reviewed today         Jamie Brookes PA-C, MPAS  06/11/2020

## 2020-06-13 ENCOUNTER — Inpatient Hospital Stay: Admit: 2020-06-13 | Payer: PRIVATE HEALTH INSURANCE

## 2020-06-13 NOTE — Progress Notes (Signed)
[facility]  PHYSICAL THERAPY  []  VESTIBULAR EVALUATION  [x]  DAILY NOTE []  PROGRESS NOTE []  DISCHARGE NOTE    [x]  [facility] [place]   []  [facility]    []  [facility]   []  [facility]    Date: 06/13/2020  Patient Name:  Theresa Vargas  DOB: Nov 01, 1955  MRN:  CSN:    Referring Practitioner , MD   Diagnosis Pulsatile tinnitus, right ear [phone].A1]    Treatment Diagnosis Dizziness, impaired VOR, impaired balance, ringing in ears, decreased cervical ROM   Date of Evaluation 05/23/20   Additional Pertinent History HTN, dizziness, vertigo, diabetes, anxiety, depression, panic disorder       Functional Outcome Measure Used DHI    Functional Outcome Score 36/100 (05/23/20)       Insurance: Primary: Payor: C[employer] /  /  / ,   Secondary:    Authorization Information: PRE CERTIFICATION REQUIRED: NO  INSURANCE THERAPY BENEFIT:  ALLOWED 90 VISITS PT/OT/ST COMBINED PER CALENDAR YEAR, NONE OF THESE VISITS HAVE BEEN USED  AQUATIC THERAPY COVERED: YES  MODALITIES COVERED:  YES, YES MASSAGE   Visit # 4, 4/10 for progress note   Visits Allowed: 90 visits   Recertification Date: 07/18/20   Physician Follow-Up: ENT 1 year. Dr. [id number] beginning of May.    Physician Orders:    History of Present Illness: Patient reports that she has been getting dizziness for about 4-5 months and has ringing in her L ear. Patient reports that she saw ENT and she had tubes placed in both ears. Patient then saw cardiologist and her heart work up was normal. Patient was then referred to PT for dizziness and ringing in her ears. Patient reports that she will get thumping in her R ear. Patient reports that her dizziness will happen with sit to stands and will be random. Patient denies any dizziness with getting into or out of bed. Patient reports that her dizziness feels more like lightheadedness verses room spinning dizziness. Patient reports that her dizzines would  last for hours if she did not take medication. Patient reports that the dizziness will resolve with medication. Patient denies any visual changes.   VNG 12/31/19- VNG testing showed no significant central or peripheral findings. Please note possible effects of middle ear dysfunction on caloric testing.     SUBJECTIVE: Patient reports she felt dizzy a couple. Dizziness occurs typically when she wakes up and stands up from bed. Pt notes lightheadedness with supine to sit at end of OT sessions for shoulder. Pt continues with tinnitus. Pt had VNG in January 2022. Pt reports she has had high anxiety today.    OBJECTIVE:  MCTSIB:   Firm eyes open-30 seconds slight sway  Firm eyes closed- 30 seconds min sway  Foam eyes open-30 seconds min sway  Foam eyes closed- 30 sec, mod sway, initially would only keep eyes closed for 2 seconds until reinforced that PT wouldn't let her fall    TREATMENT   Precautions: Positive L vertebral artery test - NO DIX HALLPIKE   Pain:     ???X??? in shaded column indicates activity completed today   Modalities Parameters/  Location  Notes                     Manual Therapy Time/Technique  Notes                     Exercise/Intervention  Notes   1 time viewing VOR, seated-horizontal and vertical ~30 sec  x Cues to move letter closer to face   x1 viewing standing feet hip width apart with head turns and nods 1 min ea  x Cues to move letter down to look through bottom bifocal, c/o dizziness post-treatment at 5/10 but would take Meclizine if she were at home                                                                    Specific Interventions Next Treatment: VOR- 1 time viewing, 2 time viewing, balance exercises    Activity/Treatment Tolerance:  [x]   Patient tolerated treatment well  []   Patient limited by fatigue  []   Patient limited by pain   []   Patient limited by medical complications  []   Other:     Assessment: Patient completed x1 viewing standing which triggered dizziness at 5-6/10  intensity. After sitting ~10 min, intensity decreased to 2/10. Time spend discussing programmable hearing aids with assist with tinnitus, VNG results, and, relationship of dizziness and anxiety, and how x1 viewing is beneficial for dizziness. Instructed patient to resume x1 viewing standing and wait 15-20 minutes to see if dizziness resolves, but continue take Meclizine as needed.     GOALS:  Patient Goal: to improve dizziness     Short Term Goals:  Time Frame: 4 weeks  1. Patient will be able to perform rhomberg on firm eyes open for 20 seconds without LOB.  2. Patient will have negative VOR testing to improve dizziness with head movements.   3. Patient will report 60% improvement in dizziness to allow improved ability to perform daily tasks.     Long Term Goals:  Time Frame: 8 weeks  1. Patient will improved DHI score from 36/100 to 20/100 to allow improved ability to perform daily tasks.   2. Patient will be independent with HEP in order to prevent re-injury and improve functional abilities.     Patient Education:   [x]   HEP/Education Completed: resume x1 viewing, see assessment section  ??? Medbridge Access Code:  []   No new Education completed  []   Reviewed Prior HEP      [x]   Patient verbalized and/or demonstrated understanding of education provided.  []   Patient unable to verbalize and/or demonstrate understanding of education provided.  Will continue education.  []   Barriers to learning:     PLAN:  Treatment Recommendations: Range of Motion, Gait Training, , Neuromuscular Re-education, Manual Therapy - Soft Tissue Mobilization, Manual Therapy - Joint Manipulation, Pain Management, Home Exercise Program, Patient Education, , Vestibular Rehabilitation and Modalities    []   Plan of care initiated.  Plan to see patient 1-2 times per week for 8 weeks to address the treatment planned outlined above.  [x]   Continue with current plan of care  []   Modify plan of care as  follows:    []   Hold pending physician visit  []   Discharge    Time In 1302   Time Out 1340   Timed Code Minutes: 38 min   Total Treatment Time: 38 min       Electronically Signed by: , PT

## 2020-06-23 ENCOUNTER — Inpatient Hospital Stay: Admit: 2020-06-23 | Payer: PRIVATE HEALTH INSURANCE

## 2020-06-23 ENCOUNTER — Encounter

## 2020-06-23 NOTE — Progress Notes (Signed)
[facility]  PHYSICAL THERAPY  []  VESTIBULAR EVALUATION  [x]  DAILY NOTE []  PROGRESS NOTE []  DISCHARGE NOTE    [x]  [facility] [place]   []  [facility]    []  [facility]   []  [facility]    Date: 06/23/2020  Patient Name:  Theresa Vargas  DOB: 06-May-1955  MRN:  CSN:    Referring Practitioner , MD   Diagnosis Pulsatile tinnitus, right ear [phone].A1]    Treatment Diagnosis Dizziness, impaired VOR, impaired balance, ringing in ears, decreased cervical ROM   Date of Evaluation 05/23/20   Additional Pertinent History HTN, dizziness, vertigo, diabetes, anxiety, depression, panic disorder       Functional Outcome Measure Used DHI    Functional Outcome Score 36/100 (05/23/20)       Insurance: Primary: Payor: C[employer] /  /  / ,   Secondary:    Authorization Information: PRE CERTIFICATION REQUIRED: NO  INSURANCE THERAPY BENEFIT:  ALLOWED 90 VISITS PT/OT/ST COMBINED PER CALENDAR YEAR, NONE OF THESE VISITS HAVE BEEN USED  AQUATIC THERAPY COVERED: YES  MODALITIES COVERED:  YES, YES MASSAGE   Visit # 5, 5/10 for progress note   Visits Allowed: 90 visits   Recertification Date: 07/18/20   Physician Follow-Up: ENT 1 year. Dr. [id number] beginning of May.    Physician Orders:    History of Present Illness: Patient reports that she has been getting dizziness for about 4-5 months and has ringing in her L ear. Patient reports that she saw ENT and she had tubes placed in both ears. Patient then saw cardiologist and her heart work up was normal. Patient was then referred to PT for dizziness and ringing in her ears. Patient reports that she will get thumping in her R ear. Patient reports that her dizziness will happen with sit to stands and will be random. Patient denies any dizziness with getting into or out of bed. Patient reports that her dizziness feels more like lightheadedness verses room spinning dizziness. Patient reports that her dizzines would  last for hours if she did not take medication. Patient reports that the dizziness will resolve with medication. Patient denies any visual changes.   VNG 12/31/19- VNG testing showed no significant central or peripheral findings. Please note possible effects of middle ear dysfunction on caloric testing.     SUBJECTIVE: Patient reports she was doing ok until last night when right ear started throbbing. Left ear tinnitus still present. Pt notes dizziness isn't as bad with exercise. Pt doing x1 viewing 2-3x/day, not more as she tends to forgot.     OBJECTIVE:  MCTSIB:   Firm eyes open-30 seconds slight sway  Firm eyes closed- 30 seconds min sway  Foam eyes open-30 seconds min sway  Foam eyes closed- 30 sec, mod sway    TREATMENT   Precautions: Positive L vertebral artery test - NO DIX HALLPIKE   Pain:     ???X??? in shaded column indicates activity completed today   Modalities Parameters/  Location  Notes                     Manual Therapy Time/Technique  Notes                     Exercise/Intervention   Notes   1 time viewing VOR, seated-horizontal and vertical ~30 sec   Cues to move letter closer to face   x1 viewing standing  feet hip width apart with head turns and nods <20 sec  x    x1 viewing standing feet together: head nods and turns 1 min ea  x No dizziness reported, min sway                                                             Specific Interventions Next Treatment: VOR- 1 time viewing, 2 time viewing, balance exercises    Activity/Treatment Tolerance:  [x]   Patient tolerated treatment well  []   Patient limited by fatigue  []   Patient limited by pain   []   Patient limited by medical complications  []   Other:     Assessment: Progressed x1 viewing to feet together without dizziness. No change in balance with mCTSIB test. Discussed using white noise machine or app at night to help with tinnitus.     GOALS:  Patient Goal: to improve dizziness     Short Term Goals:  Time Frame: 4 weeks  1. Patient will be able to  perform rhomberg on firm eyes open for 20 seconds without LOB.  2. Patient will have negative VOR testing to improve dizziness with head movements.   3. Patient will report 60% improvement in dizziness to allow improved ability to perform daily tasks.     Long Term Goals:  Time Frame: 8 weeks  1. Patient will improved DHI score from 36/100 to 20/100 to allow improved ability to perform daily tasks.   2. Patient will be independent with HEP in order to prevent re-injury and improve functional abilities.     Patient Education:   [x]   HEP/Education Completed: x1 viewing feet together, try doing 5x/day by setting timer for every 2 hours, white noise   ??? Medbridge Access Code:  []   No new Education completed  []   Reviewed Prior HEP      [x]   Patient verbalized and/or demonstrated understanding of education provided.  []   Patient unable to verbalize and/or demonstrate understanding of education provided.  Will continue education.  []   Barriers to learning:     PLAN:  Treatment Recommendations: Range of Motion, Gait Training, , Neuromuscular Re-education, Manual Therapy - Soft Tissue Mobilization, Manual Therapy - Joint Manipulation, Pain Management, Home Exercise Program, Patient Education, Safety Education and Training, Vestibular Rehabilitation and Modalities    []   Plan of care initiated.  Plan to see patient 1-2 times per week for 8 weeks to address the treatment planned outlined above.  [x]   Continue with current plan of care  []   Modify plan of care as follows:    []   Hold pending physician visit  []   Discharge    Time In 1615   Time Out 1627   Timed Code Minutes: 12 min   Total Treatment Time: 12 min       Electronically Signed by: , PT

## 2020-06-24 MED ORDER — HYDROCODONE-ACETAMINOPHEN 5-325 MG PO TABS
5-325 MG | ORAL_TABLET | Freq: Four times a day (QID) | ORAL | 0 refills | Status: DC | PRN
Start: 2020-06-24 — End: 2020-07-24

## 2020-06-24 NOTE — Telephone Encounter (Signed)
[place]. [place]DS: + for  Fluoxetine, Lorazepam, Zolpidem-Carboxyl, Hydrocodone -consistent.   Last seen: 06/02/2020. Follow-up: 09/02/2020

## 2020-07-02 ENCOUNTER — Inpatient Hospital Stay: Admit: 2020-07-02 | Payer: PRIVATE HEALTH INSURANCE

## 2020-07-02 DIAGNOSIS — I10 Essential (primary) hypertension: Secondary | ICD-10-CM

## 2020-07-02 NOTE — Progress Notes (Signed)
[facility]  PHYSICAL THERAPY  []  VESTIBULAR EVALUATION  [x]  DAILY NOTE []  PROGRESS NOTE []  DISCHARGE NOTE    [x]  [facility] [place]   []  [facility]    []  [facility]   []  [facility]    Date: 07/02/2020  Patient Name:  Theresa Vargas  DOB: [birth year]  MRN:  CSN:    Referring Practitioner , MD   Diagnosis Pulsatile tinnitus, right ear [phone]]    Treatment Diagnosis Dizziness, impaired VOR, impaired balance, ringing in ears, decreased cervical ROM   Date of Evaluation 05/23/20   Additional Pertinent History HTN, dizziness, vertigo, diabetes, anxiety, depression, panic disorder       Functional Outcome Measure Used DHI    Functional Outcome Score 36/100 (05/23/20)       Insurance: Primary: Payor: C[employer] /  /  / ,   Secondary:    Authorization Information: PRE CERTIFICATION REQUIRED: NO  INSURANCE THERAPY BENEFIT:  ALLOWED 90 VISITS PT/OT/ST COMBINED PER CALENDAR YEAR, NONE OF THESE VISITS HAVE BEEN USED  AQUATIC THERAPY COVERED: YES  MODALITIES COVERED:  YES, YES MASSAGE   Visit # 6, 6/10 for progress note   Visits Allowed: 90 visits   Recertification Date: 07/18/20   Physician Follow-Up: ENT 1 year. Dr. [id number] beginning of May.    Physician Orders:    History of Present Illness: Patient reports that she has been getting dizziness for about 4-5 months and has ringing in her L ear. Patient reports that she saw ENT and she had tubes placed in both ears. Patient then saw cardiologist and her heart work up was normal. Patient was then referred to PT for dizziness and ringing in her ears. Patient reports that she will get thumping in her R ear. Patient reports that her dizziness will happen with sit to stands and will be random. Patient denies any dizziness with getting into or out of bed. Patient reports that her dizziness feels more like lightheadedness verses room spinning dizziness. Patient reports that her dizzines would  last for hours if she did not take medication. Patient reports that the dizziness will resolve with medication. Patient denies any visual changes.   VNG 12/31/19- VNG testing showed no significant central or peripheral findings. Please note possible effects of middle ear dysfunction on caloric testing.     SUBJECTIVE: Patient reports she hasn't felt dizzy since last session. Pt primarily notices tinnitus at night when its quite. Pt doing x1 viewing 5x/day without residual dizziness.     OBJECTIVE:  MCTSIB:   Firm eyes open-30 seconds slight sway  Firm eyes closed- 30 seconds min sway  Foam eyes open-30 seconds min sway  Foam eyes closed- 30 sec, mod sway    TREATMENT   Precautions: Positive L vertebral artery test - NO DIX HALLPIKE   Pain:     ???X??? in shaded column indicates activity completed today   Modalities Parameters/  Location  Notes                     Manual Therapy Time/Technique  Notes                     Exercise/Intervention   Notes   1 time viewing VOR, seated-horizontal and vertical ~30 sec   Cues to move letter closer to face   x1 viewing standing feet hip width apart with head turns and nods <20 sec  x1 viewing standing feet together: head nods and turns <15 sec ea  x    x1 viewing seated full field: head nods 15 sec  x    x1 viewing standing full field, feet hip width apart: head nods and turns 1 min ea  x                                                Specific Interventions Next Treatment: VOR- 1 time viewing, 2 time viewing, balance exercises    Activity/Treatment Tolerance:  [x]   Patient tolerated treatment well  []   Patient limited by fatigue  []   Patient limited by pain   []   Patient limited by medical complications  []   Other:     Assessment: Pt demos good technique with x1 viewing so progressed to full field. Pt had good technique sitting so had her complete full minute standing. Pt denies dizziness upon completion of exercise. No change in balance with mCTSIB, passing all 4 tests. Plan  to recheck in 2 weeks and plan for discharge if no issues.     GOALS:  Patient Goal: to improve dizziness     Short Term Goals:  Time Frame: 4 weeks  1. Patient will be able to perform rhomberg on firm eyes open for 20 seconds without LOB.  2. Patient will have negative VOR testing to improve dizziness with head movements.   3. Patient will report 60% improvement in dizziness to allow improved ability to perform daily tasks.     Long Term Goals:  Time Frame: 8 weeks  1. Patient will improved DHI score from 36/100 to 20/100 to allow improved ability to perform daily tasks.   2. Patient will be independent with HEP in order to prevent re-injury and improve functional abilities.     Patient Education:   [x]   HEP/Education Completed: x1 viewing full field standing, progress to feet together after a week if no issues  ??? Medbridge Access Code:  []   No new Education completed  []   Reviewed Prior HEP      [x]   Patient verbalized and/or demonstrated understanding of education provided.  []   Patient unable to verbalize and/or demonstrate understanding of education provided.  Will continue education.  []   Barriers to learning:     PLAN:  Treatment Recommendations: Range of Motion, Gait Training, , Neuromuscular Re-education, Manual Therapy - Soft Tissue Mobilization, Manual Therapy - Joint Manipulation, Pain Management, Home Exercise Program, Patient Education, Safety Education and Training, Vestibular Rehabilitation and Modalities    []   Plan of care initiated.  Plan to see patient 1-2 times per week for 8 weeks to address the treatment planned outlined above.  [x]   Continue with current plan of care  []   Modify plan of care as follows:    []   Hold pending physician visit  []   Discharge    Time In 1600   Time Out 1620   Timed Code Minutes: 20 min   Total Treatment Time: 20 min       Electronically Signed by: , PT

## 2020-07-21 ENCOUNTER — Inpatient Hospital Stay: Admit: 2020-07-21 | Payer: PRIVATE HEALTH INSURANCE

## 2020-07-21 NOTE — Discharge Summary (Signed)
[place]  PHYSICAL THERAPY  []  VESTIBULAR EVALUATION  []  DAILY NOTE []  PROGRESS NOTE [x]  DISCHARGE NOTE    [x]  [facility] [place]   []  [facility]    []  [facility]   []  [facility]    Date: 07/21/2020  Patient Name:  Theresa Vargas  DOB: 12/26/1955  MRN: [id number]  CSN: [id number]    Referring Practitioner Melvia Heaps, MD   Diagnosis Pulsatile tinnitus, right ear [phone].A1]    Treatment Diagnosis Dizziness, impaired VOR, impaired balance, ringing in ears, decreased cervical ROM   Date of Evaluation 05/23/20   Additional Pertinent History HTN, dizziness, vertigo, diabetes, anxiety, depression, panic disorder       Functional Outcome Measure Used [facility]    Functional Outcome Score 36/100 (05/23/20) 4/100 (07/21/20)      Insurance: Primary: Payor: CIGNA /  /  / ,   Secondary:    Authorization Information: PRE CERTIFICATION REQUIRED: NO  INSURANCE THERAPY BENEFIT:  ALLOWED 90 VISITS PT/OT/ST COMBINED PER CALENDAR YEAR, NONE OF THESE VISITS HAVE BEEN USED  AQUATIC THERAPY COVERED: YES  MODALITIES COVERED:  YES, YES MASSAGE   Visit # 7, 7/10 for progress note   Visits Allowed: 90 visits   Recertification Date: 7/61/95   Physician Follow-Up: ENT 1 year. Dr. Johnette Abraham beginning of May.    Physician Orders:    History of Present Illness: Patient reports that she has been getting dizziness for about 4-5 months and has ringing in her L ear. Patient reports that she saw ENT and she had tubes placed in both ears. Patient then saw cardiologist and her heart work up was normal. Patient was then referred to PT for dizziness and ringing in her ears. Patient reports that she will get thumping in her R ear. Patient reports that her dizziness will happen with sit to stands and will be random. Patient denies any dizziness with getting into or out of bed. Patient reports that her dizziness feels more like lightheadedness verses room spinning dizziness. Patient reports that her  dizzines would last for hours if she did not take medication. Patient reports that the dizziness will resolve with medication. Patient denies any visual changes.   VNG 12/31/19- VNG testing showed no significant central or peripheral findings. Please note possible effects of middle ear dysfunction on caloric testing.     SUBJECTIVE: Patient reports she has had a couple bouts of dizziness. Pt can't define triggers. Pt feels anxiety may trigger it, as she felt it when checking into hotel. Pt noted she felt dizzy or "lightheaded" upon return to stand after bending over, which lasted for a hour. Pt continues to do HEP 5x/day.     OBJECTIVE:    TREATMENT   Precautions: Positive L vertebral artery test - NO DIX HALLPIKE   Pain:     ???X??? in shaded column indicates activity completed today   Modalities Parameters/  Location  Notes                     Manual Therapy Time/Technique  Notes                     Exercise/Intervention   Notes   1 time viewing VOR, seated-horizontal and vertical ~30 sec   Cues to move letter closer to face   x1 viewing standing feet hip width apart with head turns and nods <20 sec      x1 viewing  standing feet together: head nods and turns <15 sec ea      x1 viewing seated full field: head nods 15 sec      x1 viewing standing full field, feet hip width apart: head nods and turns 1 min ea             Romberg on foam: EO                                    EC 30 sec   30 sec  X  x Slight sway/bounce  Min sway/bounce          Goals assessed and reviewed with pt   x                    Specific Interventions Next Treatment: VOR- 1 time viewing, 2 time viewing, balance exercises    Activity/Treatment Tolerance:  [x]   Patient tolerated treatment well  []   Patient limited by fatigue  []   Patient limited by pain   []   Patient limited by medical complications  []   Other:     Assessment: Pt demos good progress toward goals, meeting all goals. Pt continues to have sporadic episodes of dizziness but patient able  to manage independently. Pt compliant with HEP as directed. Recommend patient to continue with x1 viewing. No additional formal PT warranted at this time.     GOALS:  Patient Goal: to improve dizziness     Short Term Goals:  Time Frame: 4 weeks  1. Patient will be able to perform rhomberg on firm eyes open for 20 seconds without LOB. GOAL MET:  Pt able to complete Romberg on foam x30 seconds with min sway, no LOB.  Discontinue Goal  2. Patient will have negative VOR testing to improve dizziness with head movements. GOAL MET:  VOR negative B.  Discontinue Goal  3. Patient will report 60% improvement in dizziness to allow improved ability to perform daily tasks. GOAL MET:  Pt reports 95% improvement since starting therapy.  Discontinue Goal    Long Term Goals:  Time Frame: 8 weeks  1. Patient will improved DHI score from 36/100 to 20/100 to allow improved ability to perform daily tasks. GOAL MET:  score 4/100.  Discontinue Goal  2. Patient will be independent with HEP in order to prevent re-injury and improve functional abilities. GOAL MET:  Pt doing x1 viewing 5x/day.  Discontinue Goal    Patient Education:   []   HEP/Education Completed: x1 viewing full field standing, progress to feet together after a week if no issues  ??? Medbridge Access Code:  []   No new Education completed  [x]   Reviewed Prior HEP      [x]   Patient verbalized and/or demonstrated understanding of education provided.  []   Patient unable to verbalize and/or demonstrate understanding of education provided.  Will continue education.  []   Barriers to learning:     PLAN:  Treatment Recommendations: Range of Motion, Gait Training, IT trainer, Neuromuscular Re-education, Manual Therapy - Soft Tissue Mobilization, Manual Therapy - Joint Manipulation, Pain Management, Home Exercise Program, Patient Education, Safety Education and Training, Vestibular Rehabilitation and Modalities    []   Plan of care initiated.  Plan to see patient 1-2 times per week for  8 weeks to address the treatment planned outlined above.  []   Continue with current plan of care  []   Modify plan of  care as follows:    []   Hold pending physician visit  [x]   Discharge    Time In 1450   Time Out 1505   Timed Code Minutes: 15 min   Total Treatment Time: 15 min       Electronically Signed by: Jetty Peeks, PT

## 2020-07-24 ENCOUNTER — Encounter

## 2020-07-24 MED ORDER — HYDROCODONE-ACETAMINOPHEN 5-325 MG PO TABS
5-325 MG | ORAL_TABLET | Freq: Four times a day (QID) | ORAL | 0 refills | Status: DC | PRN
Start: 2020-07-24 — End: 2020-08-19

## 2020-07-24 NOTE — Telephone Encounter (Signed)
OARRS reviewed. UDS: + for  Fluoxetine, Lorazepam, Zolpidem, Hydrocodone -consistent.   Last seen: 06/02/2020. Follow-up: 09/02/2020

## 2020-08-19 ENCOUNTER — Encounter

## 2020-08-20 MED ORDER — HYDROCODONE-ACETAMINOPHEN 5-325 MG PO TABS
5-325 MG | ORAL_TABLET | Freq: Four times a day (QID) | ORAL | 0 refills | Status: DC | PRN
Start: 2020-08-20 — End: 2020-09-22

## 2020-08-20 NOTE — Telephone Encounter (Signed)
OARRS reviewed. UDS: + for  hydrocodone fluoxetine lorazepam ambien consistent.   Last seen:06/02/2020. Follow-up:   Future Appointments   Date Time Provider Department Center   09/02/2020  1:00 PM Simone Curia, APRN - CNP [facility] [place]   10/23/2020  1:00 PM Waymon Amato, MD [facility] [place]   06/15/2021  2:00 PM Shauna Hugh, PA-C N Pulm Med [facility] [place]

## 2020-09-02 ENCOUNTER — Ambulatory Visit: Admit: 2020-09-02 | Discharge: 2020-09-02 | Payer: PRIVATE HEALTH INSURANCE | Attending: Family

## 2020-09-02 DIAGNOSIS — M47816 Spondylosis without myelopathy or radiculopathy, lumbar region: Secondary | ICD-10-CM

## 2020-09-02 NOTE — Progress Notes (Signed)
[facility] [place] [facility]  [facility]  [address]  [place] [address]  Dept: [phone]  Dept Fax: [phone]  Loc: [phone]    Visit Date: 09/02/2020    Functionality Assessment/Goals Worksheet     On a scale of 0 (Does not Interfere) to 10 (Completely Interferes)     1.  Which number describes how during the past week pain has interfered with       the following:  A.  General Activity:  9  B.  Mood: 4  C.  Walking Ability:  7  D.  Normal Work (Includes both work outside the home and housework):  6  E.  Relations with Other People:   6  F.  Sleep:   8  G.  Enjoyment of Life:   2    2.  Patient Prefers to Take their Pain Medications:     [x]   On a regular basis   [x]   Only when necessary    []   Does not take pain medications    3.  What are the Patient's Goals/Expectations for Visiting Pain Management?     []   Learn about my pain    [x]   Receive Medication   []   Physical Therapy     []   Treat Depression   [x]   Receive Injections    []   Treat Sleep   []   Deal with Anxiety and Stress   []   Treat Opoid Dependence/Addiction   []   Other:      HPI:   Theresa Vargas is a 65 y.o. female is here today for    Chief Complaint: Low back pain     HPI   3 month FU. Continues to have pain in low back localized to left side. Pain is up and down depending on walking, activity which increases it.     Overall pain remains remains controlled with prn Norco and Motrin prn and ice . Tolerable     Denies any radicular pain   Still left shoulder stiffness but pain greatly improved   Finished PT in beginning of July   Pain increases with bending, lifting, turning torso, walking, standing, getting up and down, and housework or working at job, worse in morning       Continues exercises and states she bought a palates bar     Medications reviewed. Patient denies side effects with medications. Patient states she is taking medications as prescribed.  Shedenies receiving pain medications from other sources. She denies any ER visits since last visit.    Pain scale with out pain medications or at its worst is 5-7/10 depending on walking   Pain scale with pain medications or at its best is 0-2/10.  Last dose of Norco was today   Drug screen reviewed from 06/02/2020 and was appropriate  Pill count completed  today and WNL: Yes      The patienthas No Known Allergies.      Subjective:      Review of Systems   Constitutional: Negative.    Musculoskeletal:  Positive for arthralgias, back pain, gait problem, joint swelling and myalgias.        No assist devices    Neurological:  Negative for dizziness, weakness, numbness and headaches.   Psychiatric/Behavioral:  Positive for dysphoric mood. Negative for sleep disturbance. The patient is not nervous/anxious.      Objective:     Vitals:  09/02/20 1303   BP: 132/82   Weight: 192 lb (87.1 kg)   Height: 5' 6.5" (1.689 m)       Physical Exam  Constitutional:       General: She is not in acute distress.     Appearance: Normal appearance. She is well-developed.   HENT:      Head: Normocephalic and atraumatic.      Right Ear: External ear normal.      Left Ear: External ear normal.      Nose: Nose normal.   Eyes:      Conjunctiva/sclera: Conjunctivae normal.      Pupils: Pupils are equal, round, and reactive to light.   Neck:      Thyroid: No thyromegaly.      Comments: Decreased range of motion with ear to shoulder both sides, tender posterior over bone   Cardiovascular:      Rate and Rhythm: Normal rate and regular rhythm.      Pulses: Normal pulses.      Heart sounds: Normal heart sounds. No murmur heard.    No friction rub. No gallop.   Pulmonary:      Effort: Pulmonary effort is normal. No respiratory distress.      Breath sounds: Normal breath sounds.   Abdominal:      General: Bowel sounds are normal.      Palpations: Abdomen is soft.   Musculoskeletal:         General: Tenderness present. No deformity.      Right shoulder:  No tenderness. Normal range of motion.      Left shoulder: Tenderness and bony tenderness present. No swelling. Decreased range of motion. Decreased strength.      Left upper arm: No tenderness or bony tenderness.      Left elbow: Normal range of motion.      Left forearm: No tenderness or bony tenderness.      Cervical back: Normal range of motion and neck supple. No tenderness.      Thoracic back: No tenderness. Normal range of motion.      Lumbar back: Tenderness and bony tenderness present. Decreased range of motion. Negative right straight leg raise test and negative left straight leg raise test.        Back:       Right knee: Normal range of motion. No tenderness.      Left knee: Normal range of motion. No tenderness.   Skin:     General: Skin is warm and dry.      Findings: No erythema or rash.          Neurological:      General: No focal deficit present.      Mental Status: She is alert and oriented to person, place, and time.      Sensory: Sensory deficit present.      Motor: Weakness present. No atrophy or abnormal muscle tone.      Coordination: Coordination abnormal.      Gait: Gait normal.      Deep Tendon Reflexes: Reflexes are normal and symmetric.      Reflex Scores:       Tricep reflexes are 2+ on the right side and 2+ on the left side.       Bicep reflexes are 2+ on the right side and 2+ on the left side.       Brachioradialis reflexes are 2+ on the right side and 2+ on the left side.  Patellar reflexes are 2+ on the right side and 2+ on the left side.       Achilles reflexes are 2+ on the right side and 2+ on the left side.     Comments: Bilateral lower and right upper extremity 5/5            Psychiatric:         Behavior: Behavior normal.         Thought Content: Thought content normal.     FABER  Patricks test  positive  Yeoman's  or Gaenslen's positive         Assessment:     1. Spondylosis of lumbar region without myelopathy or radiculopathy    2. Spinal stenosis of lumbar region with  neurogenic claudication    3. Lumbar post-laminectomy syndrome    4. SI (sacroiliac) pain    5. Sacroiliac inflammation (HCC)    6. Chronic pain syndrome    7. Chronic, continuous use of opioids            Plan:      OARRS reviewed. Current MED: 20.00  Patient was offered naloxone for home.   Discussed long term side effects of medications, tolerance, dependency and addiction.  Previous UDS reviewed  UDS preformed today for compliance.  Patient told can not receive any pain medications from any other source.  No evidence of abuse, diversion or aberrant behavior.  Medications and/or procedures to improve function and quality of life- patient understanding with this and that may not be pain free  Discussed with patient about safe storage of medications at home  Discussed possible weaning of medication dosing dependent on treatment/procedure results.   Discussed with patient about risks with procedure including infection, reaction to medication, increased pain, or bleeding.  Procedure notes reviewed in detail.   Received 70% to 80% relief of low back pain and SI pain from SI MBB # 1 for over a month but states insurance did not cover it. Will repeat when she changes insurance or if they cover them in future.  Discussed updating imaging (MRI and SCS again). Patient does not want. Also discussed surgery referral again   Continue Norco 5/325 QID prn- filled 08/26/2020 and has plenty. Patient is compliant   Pain remains controlled    Meds. Prescribed:   No orders of the defined types were placed in this encounter.      Return in about 10 weeks (around 11/11/2020), or if symptoms worsen or fail to improve, for follow up  for medications.               Electronically signed by Simone Curia, APRN - CNP on8/03/2020 at 1:31 PM

## 2020-09-22 ENCOUNTER — Encounter

## 2020-09-23 MED ORDER — HYDROCODONE-ACETAMINOPHEN 5-325 MG PO TABS
5-325 MG | ORAL_TABLET | Freq: Four times a day (QID) | ORAL | 0 refills | Status: DC | PRN
Start: 2020-09-23 — End: 2020-10-21

## 2020-09-23 NOTE — Telephone Encounter (Signed)
OARRS reviewed. UDS: + for Ambien, Ativan, Hydrocodone, Prozac.   Last seen: 09/02/2020. Follow-up: 11/11/20

## 2020-10-21 ENCOUNTER — Encounter

## 2020-10-21 MED ORDER — HYDROCODONE-ACETAMINOPHEN 5-325 MG PO TABS
5-325 MG | ORAL_TABLET | Freq: Four times a day (QID) | ORAL | 0 refills | Status: DC | PRN
Start: 2020-10-21 — End: 2020-11-19

## 2020-10-21 NOTE — Telephone Encounter (Signed)
OARRS reviewed. UDS: + for  Zolpidem, Lorazepam, Hydrocodone, Fluoxetine -consistent.   Last seen: 09/02/2020. Follow-up: 11/11/2020

## 2020-10-23 ENCOUNTER — Encounter: Attending: Internal Medicine

## 2020-11-10 ENCOUNTER — Inpatient Hospital Stay: Admit: 2020-11-10 | Payer: PRIVATE HEALTH INSURANCE

## 2020-11-10 ENCOUNTER — Encounter

## 2020-11-10 DIAGNOSIS — Z1231 Encounter for screening mammogram for malignant neoplasm of breast: Secondary | ICD-10-CM

## 2020-11-11 ENCOUNTER — Ambulatory Visit: Admit: 2020-11-11 | Discharge: 2020-11-11 | Payer: PRIVATE HEALTH INSURANCE | Attending: Family

## 2020-11-11 DIAGNOSIS — M533 Sacrococcygeal disorders, not elsewhere classified: Secondary | ICD-10-CM

## 2020-11-11 NOTE — Progress Notes (Signed)
[facility] [place] [facility]  [facility]  [address]  [place] [address]  Dept: [phone]  Dept Fax: [phone]  Loc: [phone]    Visit Date: 11/11/2020    Functionality Assessment/Goals Worksheet     On a scale of 0 (Does not Interfere) to 10 (Completely Interferes)     1.  Which number describes how during the past week pain has interfered with       the following:  A.  General Activity:  8  B.  Mood: 9  C.  Walking Ability:  8  D.  Normal Work (Includes both work outside the home and housework):  5  E.  Relations with Other People:   1  F.  Sleep:   5  G.  Enjoyment of Life:   5    2.  Patient Prefers to Take their Pain Medications:     [x]   On a regular basis   [x]   Only when necessary    []   Does not take pain medications    3.  What are the Patient's Goals/Expectations for Visiting Pain Management?     []   Learn about my pain    [x]   Receive Medication   []   Physical Therapy     []   Treat Depression   [x]   Receive Injections    []   Treat Sleep   []   Deal with Anxiety and Stress   []   Treat Opoid Dependence/Addiction   []   Other:      HPI:   Theresa Vargas is a 65 y.o. female is here today for    Chief Complaint: Low back pain, SI pain     HPI   10 week FU. Continues to have pain in low back axial and into SI area- constant pain and aching pain throughout leg. Pain level depends on activity     Has been more active walking every other day for about 30 minutes/ up to 2 miles at a time. States this increases pain a little.   Pain increases with bending, lifting, twisting , walking, standing, getting up and down, and housework or working at job.    Continues Norco prn, motrin prn which remains effective    Has been having depression and continues psychosocial management with psychology and psychiatry   Medications reviewed. Patient denies side effects with medications. Patient states she is taking medications as prescribed.  Shedenies receiving pain medications from other sources. She denies any ER visits since last visit.    Pain scale with out pain medications or at its worst is 7-9/10.  Pain scale with pain medications or at its best is 3/10.  Last dose of Norco was today   Drug screen reviewed from 09/02/2020 and was appropriate  Pill count completed  today and WNL: Yes      The patienthas No Known Allergies.      Subjective:      Review of Systems   Constitutional: Negative.    Musculoskeletal:  Positive for arthralgias, back pain, gait problem, joint swelling and myalgias. Negative for neck pain and neck stiffness.        No assist devices    Neurological:  Negative for dizziness, weakness, numbness and headaches.   Psychiatric/Behavioral:  Positive for dysphoric mood. Negative for sleep disturbance. The patient is nervous/anxious.      Objective:     Vitals:    11/11/20 1251  BP: 124/80   Weight: 192 lb (87.1 kg)   Height: 5' 6.5" (1.689 m)       Physical Exam  Constitutional:       General: She is not in acute distress.     Appearance: Normal appearance. She is well-developed.   HENT:      Head: Normocephalic and atraumatic.      Right Ear: External ear normal.      Left Ear: External ear normal.      Nose: Nose normal.   Eyes:      Conjunctiva/sclera: Conjunctivae normal.      Pupils: Pupils are equal, round, and reactive to light.   Neck:      Thyroid: No thyromegaly.      Comments: Decreased range of motion with ear to shoulder both sides, tender posterior over bone   Cardiovascular:      Rate and Rhythm: Normal rate and regular rhythm.      Pulses: Normal pulses.      Heart sounds: Normal heart sounds. No murmur heard.    No friction rub. No gallop.   Pulmonary:      Effort: Pulmonary effort is normal. No respiratory distress.      Breath sounds: Normal breath sounds.   Abdominal:      General: Bowel sounds are normal.      Palpations: Abdomen is soft.   Musculoskeletal:         General: Tenderness present. No deformity.       Right shoulder: No tenderness. Normal range of motion.      Left shoulder: Tenderness and bony tenderness present. No swelling. Decreased range of motion. Decreased strength.      Left upper arm: No tenderness or bony tenderness.      Left elbow: Normal range of motion.      Left forearm: No tenderness or bony tenderness.      Cervical back: Normal range of motion and neck supple. No tenderness.      Thoracic back: No tenderness. Normal range of motion.      Lumbar back: Tenderness and bony tenderness present. Decreased range of motion. Negative right straight leg raise test and negative left straight leg raise test.        Back:       Right knee: Normal range of motion. No tenderness.      Left knee: Normal range of motion. No tenderness.   Skin:     General: Skin is warm and dry.      Findings: No erythema or rash.          Neurological:      General: No focal deficit present.      Mental Status: She is alert and oriented to person, place, and time.      Sensory: Sensory deficit present.      Motor: Weakness present. No atrophy or abnormal muscle tone.      Coordination: Coordination abnormal.      Gait: Gait normal.      Deep Tendon Reflexes: Reflexes are normal and symmetric.      Reflex Scores:       Tricep reflexes are 2+ on the right side and 2+ on the left side.       Bicep reflexes are 2+ on the right side and 2+ on the left side.       Brachioradialis reflexes are 2+ on the right side and 2+ on the left side.  Patellar reflexes are 2+ on the right side and 2+ on the left side.       Achilles reflexes are 2+ on the right side and 2+ on the left side.     Comments: Bilateral lower and right upper extremity 5/5            Psychiatric:         Behavior: Behavior normal.         Thought Content: Thought content normal.      Comments: Depressed mood      FABER  Patricks test  positive  Yeoman's  or Gaenslen's positive       Assessment:     1. SI (sacroiliac) pain    2. Sacroiliac inflammation (HCC)    3.  Spondylosis of lumbar region without myelopathy or radiculopathy    4. Spinal stenosis of lumbar region with neurogenic claudication    5. Lumbar post-laminectomy syndrome    6. Chronic pain syndrome    7. Chronic, continuous use of opioids            Plan:      OARRS reviewed. Current MED: 20.00  Patient was offered naloxone for home.   Discussed long term side effects of medications, tolerance, dependency and addiction.  Previous UDS reviewed  UDS preformed today for compliance.  Patient told can not receive any pain medications from any other source.  No evidence of abuse, diversion or aberrant behavior.  Medications and/or procedures to improve function and quality of life- patient understanding with this and that may not be pain free  Discussed with patient about safe storage of medications at home  Discussed possible weaning of medication dosing dependent on treatment/procedure results.   Discussed with patient about risks with procedure including infection, reaction to medication, increased pain, or bleeding.  Procedure notes reviewed in detail.   Received 80% relief of low back pain and SI pain from SI MBB # 1 for over a month with improvement of mobility. Had issues in past with insurance coverage but states this has resolved.   Plan Bilateral SI mbb # 2 for diagnostic purpose. Procedure discussed in detail.   Discussed updating imaging (MRI and SCS again). Patient does not want. Also discussed surgery referral again depending on relief from SI injection   Will need to hold NSAID prior to procedure   Continue Norco 5/325 QID prn- filled 10/23/2020 and has plenty. Patient is compliant       Meds. Prescribed:   No orders of the defined types were placed in this encounter.      Return for Bilateral SI mbb # 2. , follow up after procedure.               Electronically signed by Simone Curia, APRN - CNP on10/12/2020 at 1:21 PM

## 2020-11-11 NOTE — Telephone Encounter (Signed)
Pt. denies taking any blood thinners prior to procedure scheduling

## 2020-11-12 ENCOUNTER — Encounter

## 2020-11-14 ENCOUNTER — Inpatient Hospital Stay: Admit: 2020-11-14 | Payer: PRIVATE HEALTH INSURANCE

## 2020-11-14 DIAGNOSIS — R922 Inconclusive mammogram: Secondary | ICD-10-CM

## 2020-11-19 ENCOUNTER — Encounter

## 2020-11-19 MED ORDER — HYDROCODONE-ACETAMINOPHEN 5-325 MG PO TABS
5-325 MG | ORAL_TABLET | Freq: Four times a day (QID) | ORAL | 0 refills | Status: DC | PRN
Start: 2020-11-19 — End: 2020-12-22

## 2020-11-19 NOTE — Telephone Encounter (Signed)
OARRS reviewed. UDS: + for  Hydrocodone, Fluoxetine, Zolpidem, Lorazepam -consistent.   Last seen: 11/11/2020. Follow-up: 01/12/2021

## 2020-11-26 LAB — COMPREHENSIVE METABOLIC PANEL
ALT: 14 U/L (ref 5–40)
AST: 10 U/L (ref 9–40)
Albumin: 4.6 g/dL (ref 3.5–5.2)
Alk Phosphatase: 126 U/L (ref 40–140)
Anion Gap: 14 meq/L (ref 7.0–16.0)
BUN: 10 mg/dL (ref 8–23)
CO2: 30 meq/L (ref 19–31)
Calcium: 9.7 mg/dL (ref 8.5–10.5)
Chloride: 97 meq/L (ref 95–107)
Creatinine: 0.78 mg/dL (ref 0.60–1.30)
EGFR IF NonAfrican American: 86 mL/min/{1.73_m2} (ref >60)
Glucose: 133 mg/dL — ABNORMAL HIGH (ref 70–99)
Potassium: 3.8 meq/L (ref 3.5–5.4)
Sodium: 141 meq/L (ref 133–146)
Total Bilirubin: 0.2 mg/dL (ref <=1.2)
Total Protein: 7.2 g/dL (ref 6.1–8.3)

## 2020-11-26 LAB — HEMOGLOBIN A1C
AVERAGE GLUCOSE: 146 mg/dL — ABNORMAL HIGH (ref <117)
Hemoglobin A1C: 6.7 % — ABNORMAL HIGH (ref 4.2–5.6)

## 2020-11-26 LAB — ALBUMIN, RANDOM URINE
Creatine, Urine: 141 mg/dL
Microalb, Ur: <1.2 mg/dL

## 2020-11-26 LAB — CBC
Absolute Baso #: 0.03 10*3/uL (ref 0.00–0.20)
Absolute Eos #: 0.1 10*3/uL (ref 0.00–0.50)
Absolute Lymph #: 3 10*3/uL (ref 1.00–4.00)
Absolute Mono #: 0.46 10*3/uL (ref 0.20–1.00)
Absolute Neut #: 4.78 10*3/uL (ref 1.50–7.50)
Basophils %: 0.4 %
Eosinophils %: 1.2 %
Hematocrit: 40.5 % (ref 34.0–45.0)
Hemoglobin: 13.8 g/dL (ref 11.5–15.5)
Lymphocyte %: 35.8 %
MCH: 29.4 pg (ref 25.0–33.0)
MCHC: 34.1 g/dL (ref 31.0–36.0)
MCV: 86.2 fL (ref 80.0–99.0)
MPV: 9.5 fL (ref 9.3–13.0)
Monocytes: 5.5 %
Neutrophils %: 56.9 %
Platelets: 373 10*3/uL (ref 130–400)
RBC: 4.7 M/uL (ref 3.80–5.40)
RDW: 12.4 % (ref 11.5–15.0)
WBC: 8.4 10*3/uL (ref 3.5–11.0)

## 2020-11-26 LAB — LIPID PANEL W/ REFLEX DIRECT LDL
Chol/HDL Ratio: 4.3 RATIO (ref <4.44)
Cholesterol: 154 mg/dL (ref <200)
HDL: 36 mg/dL — ABNORMAL LOW (ref >39)
LDL Calculated: 65 mg/dL (ref <100)
LDL/HDL Ratio: 1.8 RATIO (ref <3.22)
Triglycerides: 267 mg/dL — ABNORMAL HIGH (ref <149)
VLDL Cholesterol Calculated: 53 mg/dL — ABNORMAL HIGH (ref <30)

## 2020-11-27 ENCOUNTER — Inpatient Hospital Stay
Admit: 2020-11-27 | Discharge: 2020-11-28 | Disposition: A | Discharge status: Home / Self Care | Payer: PRIVATE HEALTH INSURANCE | Attending: Emergency Medicine

## 2020-11-27 ENCOUNTER — Emergency Department: Admit: 2020-11-28 | Payer: PRIVATE HEALTH INSURANCE

## 2020-11-27 DIAGNOSIS — M940 Chondrocostal junction syndrome [Tietze]: Secondary | ICD-10-CM

## 2020-11-27 NOTE — ED Provider Notes (Signed)
[facility]  EMERGENCY DEPARTMENT ENCOUNTER          Pt Name: Theresa Vargas  MRN: [id number]  Birthdate May 19, 1955  Date of evaluation: 11/27/2020  Treating Resident Physician: Johney Maine, MD  Supervising Physician: Lyman Speller MD    History obtained from chart review and the patient.    CHIEF COMPLAINT       Chief Complaint   Patient presents with    Chest Pain       HISTORY OF PRESENT ILLNESS    HPI  Theresa Vargas is a 65 y.o. female with PMH of costochondritis, HTN, DM who presents to the emergency department for evaluation of chest pain.  Patient stated that the pain started 2 days ago and has been constant since then.  Patient stated that the pain is in the center of her chest at a an 8 out of 10, does not radiate and feels sharp.  She stated that the pain feels like her costochondritis but feels more severe than usual.  She is denying any shortness of breath, nausea, vomiting, abdominal pain, bowel changes, urinary changes, cough, fever.  The patient has no other acute complaints at this time.      REVIEW OF SYSTEMS   Review of Systems   Constitutional:  Negative for chills, diaphoresis, fatigue and fever.   HENT:  Negative for congestion, dental problem, ear discharge, ear pain, facial swelling, hearing loss and sore throat.    Eyes:  Negative for photophobia, pain, redness, itching and visual disturbance.   Respiratory:  Negative for cough, choking, chest tightness, shortness of breath, wheezing and stridor.    Cardiovascular:  Positive for chest pain. Negative for palpitations and leg swelling.   Gastrointestinal:  Negative for abdominal distention, abdominal pain, constipation, diarrhea, nausea and vomiting.   Endocrine: Negative for polydipsia, polyphagia and polyuria.   Genitourinary:  Negative for difficulty urinating, dyspareunia, dysuria, flank pain, genital sores, pelvic pain, vaginal bleeding, vaginal discharge and vaginal pain.   Musculoskeletal:  Negative  for arthralgias, back pain, myalgias, neck pain and neck stiffness.   Skin:  Negative for pallor, rash and wound.   Allergic/Immunologic: Negative for immunocompromised state.   Neurological:  Negative for dizziness, seizures, syncope, light-headedness, numbness and headaches.   Psychiatric/Behavioral:  Negative for confusion, dysphoric mood, hallucinations and suicidal ideas. The patient is not nervous/anxious.        PAST MEDICAL AND SURGICAL HISTORY     Past Medical History:   Diagnosis Date    Depression     Diabetes mellitus (HCC)     type 2-on Lantus    H. pylori infection 05/03/2018    stool    Hyperlipidemia     Hypertension     Nausea & vomiting     Panic disorder     PONV (postoperative nausea and vomiting)     Psychiatric problem     Sleep apnea     on cpap     Past Surgical History:   Procedure Laterality Date    BACK INJECTION Bilateral 07/12/2019    bilateral SI MBB # 1 performed by Halina Andreas, MD at [facility] OR    BACK SURGERY  11/28/2015    [place]    CARDIOVASCULAR STRESS TEST  2018    CARDIOVASCULAR STRESS TEST  2016    CHOLECYSTECTOMY  2007    Dr Neoma Laming    COLONOSCOPY      2006-Dr Rickey Primus  HEMORRHOID SURGERY  02/2011    Dr Mackey Birchwood skin tag removal    HYSTERECTOMY (CERVIX STATUS UNKNOWN)  2007    Dr Marlou Porch age 46    NECK SURGERY  2008 [facility]    [facility]    NERVE BLOCK  10/29/2014    LUMBAR FACET INJECTION    OTHER SURGICAL HISTORY  11/05/2010    hemmoridectomy-Dr Olt    OTHER SURGICAL HISTORY Bilateral 01/06/2015    Lumbar Facet    OTHER SURGICAL HISTORY  09/2010    hemorrhoid banding-Dr Neidich    OTHER SURGICAL HISTORY  09/2010    hemorrhoid banding-Dr Neidich    PR CREATE EARDRUM OPENING,GEN ANESTH Left 07/19/2016    MYRINGOTOMY TYPANOSTOMY TUBE PLACEMENT, LEFT performed by Geoffery Lyons, MD at [facility] OR    SKIN BIOPSY  2007    left upper arm-benign Dr Rosie Fate    TYMPANOSTOMY TUBE PLACEMENT  2006,2011,2016         MEDICATIONS   No current  facility-administered medications for this encounter.    Current Outpatient Medications:     lidocaine (LIDODERM) 5 %, Place 1 patch onto the skin daily for 10 days 12 hours on, 12 hours off., Disp: 10 patch, Rfl: 0    HYDROcodone-acetaminophen (NORCO) 5-325 MG per tablet, Take 1 tablet by mouth every 6 hours as needed for Pain for up to 30 days., Disp: 120 tablet, Rfl: 0    losartan (COZAAR) 100 MG tablet, Take 100 mg by mouth daily, Disp: , Rfl:     hydroCHLOROthiazide (HYDRODIURIL) 25 MG tablet, Take 25 mg by mouth daily, Disp: , Rfl:     ibuprofen (ADVIL;MOTRIN) 200 MG tablet, Take 200 mg by mouth every 6 hours as needed for Pain, Disp: , Rfl:     FLUoxetine (PROZAC) 40 MG capsule, Take 40 mg by mouth daily, Disp: , Rfl:     Semaglutide (OZEMPIC, 0.25 OR 0.5 MG/DOSE, SC), Inject 0.5 mg into the skin once a week, Disp: , Rfl:     NOVOLOG FLEXPEN 100 UNIT/ML injection pen, Changed to sliding scale, Disp: , Rfl:     pravastatin (PRAVACHOL) 20 MG tablet, , Disp: , Rfl:     Insulin Glargine (LANTUS SC), Inject into the skin 10 units daily increase by 1 unit daily until BS reach 150 goal, Disp: , Rfl:     diltiazem (TIAZAC) 300 MG extended release capsule, TAKE 1 CAPSULE BY MOUTH ONE TIME A DAY, Disp: , Rfl: 3    hyoscyamine (LEVSIN/SL) 125 MCG sublingual tablet, DISSOLVE 1 TABLET UNDER TONGUE EVERY FOUR HOURS AS NEEDED, Disp: , Rfl: 5    CPAP Machine MISC, by Does not apply route Please check patient's PAP machine for proper functioning by [employer]., Disp: 1 each, Rfl: 0    ONE TOUCH ULTRA TEST strip, CHECK BLOOD SUGAR ONCE DAILY., Disp: , Rfl: 3    zolpidem (AMBIEN) 10 MG tablet, Take 10 mg by mouth nightly. Takes every night, Disp: , Rfl:     LORazepam (ATIVAN) 1 MG tablet, Take 1 mg by mouth every 6 hours as needed.  , Disp: , Rfl:       SOCIAL HISTORY     Social History     Social History Narrative    Not on file     Social History     Tobacco Use    Smoking status: Never    Smokeless tobacco: Never   Vaping  Use    Vaping Use: Never  used   Substance Use Topics    Alcohol use: No    Drug use: No         ALLERGIES   No Known Allergies      FAMILY HISTORY     Family History   Problem Relation Age of Onset    COPD Mother     Hypertension Mother     Diabetes Mother     High Cholesterol Mother     Depression Mother     Alcohol Abuse Mother     Hypertension Father     Diabetes Father     High Cholesterol Father     Heart Attack Father     Diabetes Other     Hypertension Other     Mental Illness Other     Heart Disease Other     Other Other         bone and joint problems    Breast Cancer Other 45        maternal second cousin breast cancer         PREVIOUS RECORDS   Previous records reviewed:       PHYSICAL EXAM     ED Triage Vitals   BP Temp Temp src Pulse Resp SpO2 Height Weight   -- -- -- -- -- -- -- --     Initial vital signs and nursing assessment reviewed and normal. Body mass index is 30.07 kg/m??. Pulsoximetry is normal per my interpretation.    Additional Vital Signs:  Vitals:    11/27/20 2133   BP: 125/73   Pulse: 68   Resp: 20   Temp:    SpO2: 95%       Physical Exam  Vitals and nursing note reviewed.   Constitutional:       General: She is not in acute distress.     Appearance: Normal appearance. She is not ill-appearing, toxic-appearing or diaphoretic.   HENT:      Head: Normocephalic and atraumatic.      Right Ear: External ear normal.      Left Ear: External ear normal.      Nose: Nose normal. No congestion or rhinorrhea.      Mouth/Throat:      Mouth: Mucous membranes are moist.      Pharynx: Oropharynx is clear. No oropharyngeal exudate or posterior oropharyngeal erythema.   Eyes:      General: No scleral icterus.        Right eye: No discharge.         Left eye: No discharge.      Extraocular Movements: Extraocular movements intact.      Conjunctiva/sclera: Conjunctivae normal.      Pupils: Pupils are equal, round, and reactive to light.   Cardiovascular:      Rate and Rhythm: Normal rate and regular rhythm.       Pulses: Normal pulses.      Heart sounds: Normal heart sounds. No murmur heard.    No gallop.   Pulmonary:      Effort: Pulmonary effort is normal. No respiratory distress.      Breath sounds: Normal breath sounds. No stridor. No wheezing, rhonchi or rales.   Chest:      Chest wall: Tenderness present.   Abdominal:      General: Bowel sounds are normal. There is no distension.      Palpations: Abdomen is soft.      Tenderness: There is no abdominal tenderness. There  is no right CVA tenderness, left CVA tenderness, guarding or rebound.   Musculoskeletal:         General: No swelling, tenderness, deformity or signs of injury.      Cervical back: Neck supple. No rigidity or tenderness.      Right lower leg: No edema.      Left lower leg: No edema.   Lymphadenopathy:      Cervical: No cervical adenopathy.   Skin:     General: Skin is warm and dry.      Coloration: Skin is not jaundiced or pale.      Findings: No bruising, erythema, lesion or rash.   Neurological:      General: No focal deficit present.      Mental Status: She is alert and oriented to person, place, and time. Mental status is at baseline.   Psychiatric:         Mood and Affect: Mood normal.         Behavior: Behavior normal.         Thought Content: Thought content normal.         Judgment: Judgment normal.       MEDICAL DECISION MAKING   Initial Assessment:   66 year old female come to the ED for evaluation of midline chest pain.  Patient stated that the pain feels exactly like her costochondritis, and is in the same place as her costochondritis but is more severe than usual.  Pain worsens with palpation, was nonradiating, did not worsen with breathing, and did not change with position.  Patient had bilateral breath sounds equal clear and was satting well on room air.  Patient had a Wells score of 0 indicating very low risk of PE.  MI was considered ECG showing no new changes concerning for STEMI but positive first-degree block, troponin was  negative, and patient's heart score was 3 due to medical history and age.  Considered costochondritis, myocarditis, pericarditis, MI, PE, musculoskeletal pain.  Discussed with patient and no cardiac causes of her pain were found on labs or exam but discussed with her the potential for doing a second troponin after 3 hours.  Shared decision making patient decided to be discharged rather than wait for second troponin.  Discussed with patient low index of suspicion for cardiac cause of her pain due to Toradol and Lidoderm decreasing her pain, her presentation, lab and ECG findings making less concerning for cardiac cause.  Sent a prescription for Lidoderm patches for patient's chest pain and advised her she can take 800 mg ibuprofen 3 times daily for pain.  Strict return precautions discussed with patient and she was discharged.    Plan:   CBC, CMP, troponin, CXR, ECG  Lidocaine patch, Toradol for pain  Prescription for Lidoderm patches  Discharge with outpatient follow-up        ED RESULTS   Laboratory results:  Labs Reviewed   CBC WITH AUTO DIFFERENTIAL - Abnormal; Notable for the following components:       Result Value    MPV 8.7 (*)     All other components within normal limits   COMPREHENSIVE METABOLIC PANEL W/ REFLEX TO MG FOR LOW K - Abnormal; Notable for the following components:    Glucose 151 (*)     Potassium reflex Magnesium 3.3 (*)     Chloride 96 (*)     All other components within normal limits   OSMOLALITY - Abnormal; Notable for the following components:  Osmolality Calc 274.3 (*)     All other components within normal limits   TROPONIN   GLOMERULAR FILTRATION RATE, ESTIMATED   ANION GAP   MAGNESIUM       Radiologic studies results:  XR CHEST PORTABLE   Final Result   Impression:   Normal chest.      This document has been electronically signed by: Vivi Martens, MD on    11/27/2020 08:42 PM          ED Medications administered this visit:   Medications   ketorolac (TORADOL) injection 15 mg (15  mg IntraVENous Given 11/27/20 2117)         ED COURSE      Strict return precautions and follow up instructions were discussed with the patient prior to discharge, with which the patient agrees.      MEDICATION CHANGES     Discharge Medication List as of 11/27/2020 10:13 PM        START taking these medications    Details   lidocaine (LIDODERM) 5 % Place 1 patch onto the skin daily for 10 days 12 hours on, 12 hours off., Disp-10 patch, R-0Normal               FINAL DISPOSITION     Final diagnoses:   Costochondritis   Chest pain, unspecified type     Condition: condition: good  Dispo: Discharge to home      This transcription was electronically signed. Parts of this transcriptions may have been dictated by use of voice recognition software and electronically transcribed, and parts may have been transcribed with the assistance of an ED scribe. The transcription may contain errors not detected in proofreading.  Please refer to my supervising physician's documentation if my documentation differs.    Electronically Signed: Johney Maine, MD, 11/28/20, 12:32 AM        Johney Maine, MD  Resident  11/28/20 [phone]

## 2020-11-27 NOTE — ED Notes (Signed)
Repeat EKG completed and given to Dr Derrill Kay. Pt provided with ice water per request. No other needs or concerns expressed. VSS     [place], [place]  11/27/20 2040

## 2020-11-27 NOTE — Discharge Instructions (Addendum)
As discussed multiple tests were done to check for any heart abnormalities.  Nothing was found but second troponin was not run.  If you do develop change in your chest pain, worsening of chest pain, shortness of breath, nausea, or sweating please return to the ER for evaluation.

## 2020-11-27 NOTE — ED Notes (Signed)
Pt medicated per MAR. No needs expressed at this time. Respirations even and unlabored, call light within reach. VSS       [place], [place]  11/27/20 2133

## 2020-11-27 NOTE — ED Triage Notes (Signed)
Pt presents to the ER from home with c/o CP for the last 2 days. Pt states it has been "on and off" for the last week but has been constant the last 2 days. Pt does not see a cardiologist. Pt took 1/2 a Norco and ibuprofen at 7:30pm. Pt is alert and oriented, respirations even and unlabored. VSS

## 2020-11-28 ENCOUNTER — Telehealth

## 2020-11-28 LAB — COMPREHENSIVE METABOLIC PANEL W/ REFLEX TO MG FOR LOW K
ALT: 21 U/L (ref 11–66)
AST: 26 U/L (ref 5–40)
Albumin: 4.6 g/dL (ref 3.5–5.1)
Alkaline Phosphatase: 126 U/L (ref 38–126)
BUN: 11 mg/dL (ref 7–22)
CO2: 26 meq/L (ref 23–33)
Calcium: 9.4 mg/dL (ref 8.5–10.5)
Chloride: 96 meq/L — ABNORMAL LOW (ref 98–111)
Creatinine: 0.7 mg/dL (ref 0.4–1.2)
Glucose: 151 mg/dL — ABNORMAL HIGH (ref 70–108)
Potassium reflex Magnesium: 3.3 meq/L — ABNORMAL LOW (ref 3.5–5.2)
Sodium: 136 meq/L (ref 135–145)
Total Bilirubin: 0.3 mg/dL (ref 0.3–1.2)
Total Protein: 7.4 g/dL (ref 6.1–8.0)

## 2020-11-28 LAB — EKG 12-LEAD
Atrial Rate: 64 {beats}/min
Atrial Rate: 76 {beats}/min
P Axis: 50 degrees
P Axis: 52 degrees
P-R Interval: 230 ms
P-R Interval: 234 ms
Q-T Interval: 402 ms
Q-T Interval: 422 ms
QRS Duration: 82 ms
QRS Duration: 84 ms
QTc Calculation (Bazett): 435 ms
QTc Calculation (Bazett): 452 ms
R Axis: 29 degrees
R Axis: 31 degrees
T Axis: 56 degrees
T Axis: 70 degrees
Ventricular Rate: 64 {beats}/min
Ventricular Rate: 76 {beats}/min

## 2020-11-28 LAB — CBC WITH AUTO DIFFERENTIAL
Basophils Absolute: 0 10*3/uL (ref 0.0–0.1)
Basophils: 0.2 %
Eosinophils Absolute: 0.1 10*3/uL (ref 0.0–0.4)
Eosinophils: 1.2 %
Hematocrit: 41.8 % (ref 37.0–47.0)
Hemoglobin: 14.2 gm/dl (ref 12.0–16.0)
Immature Grans (Abs): 0.02 10*3/uL (ref 0.00–0.07)
Immature Granulocytes: 0.2 %
Lymphocytes Absolute: 3.4 10*3/uL (ref 1.0–4.8)
Lymphocytes: 38.7 %
MCH: 29.5 pg (ref 26.0–33.0)
MCHC: 34 gm/dl (ref 32.2–35.5)
MCV: 86.9 fL (ref 81.0–99.0)
MPV: 8.7 fL — ABNORMAL LOW (ref 9.4–12.4)
Monocytes Absolute: 0.4 10*3/uL (ref 0.4–1.3)
Monocytes: 5 %
Platelets: 362 10*3/uL (ref 130–400)
RBC: 4.81 10*6/uL (ref 4.20–5.40)
RDW-CV: 12.2 % (ref 11.5–14.5)
RDW-SD: 38.9 fL (ref 35.0–45.0)
Seg Neutrophils: 54.7 %
Segs Absolute: 4.9 10*3/uL (ref 1.8–7.7)
WBC: 8.9 10*3/uL (ref 4.8–10.8)
nRBC: 0 /100 wbc

## 2020-11-28 LAB — MAGNESIUM: Magnesium: 2.2 mg/dL (ref 1.6–2.4)

## 2020-11-28 LAB — ANION GAP: Anion Gap: 14 meq/L (ref 8.0–16.0)

## 2020-11-28 LAB — OSMOLALITY: Osmolality Calc: 274.3 mOsmol/kg — ABNORMAL LOW (ref 275.0–300.0)

## 2020-11-28 LAB — TROPONIN: Troponin T: <0.01 ng/ml

## 2020-11-28 LAB — GLOMERULAR FILTRATION RATE, ESTIMATED: Est, Glom Filt Rate: >60 mL/min/{1.73_m2} (ref >60)

## 2020-11-28 MED ORDER — LIDOCAINE 4 % EX PTCH
4 % | Freq: Every day | CUTANEOUS | Status: DC
Start: 2020-11-28 — End: 2020-11-28
  Administered 2020-11-28: 01:00:00 1 via TRANSDERMAL

## 2020-11-28 MED ORDER — KETOROLAC TROMETHAMINE 30 MG/ML IJ SOLN
30 MG/ML | Freq: Once | INTRAMUSCULAR | Status: AC
Start: 2020-11-28 — End: 2020-11-27
  Administered 2020-11-28: 01:00:00 15 mg via INTRAVENOUS

## 2020-11-28 MED ORDER — LIDOCAINE 4 % EX PTCH
4 % | Freq: Every day | CUTANEOUS | Status: DC
Start: 2020-11-28 — End: 2020-11-27

## 2020-11-28 MED ORDER — LIDOCAINE 5 % EX PTCH
5 % | MEDICATED_PATCH | Freq: Every day | CUTANEOUS | 0 refills | Status: AC
Start: 2020-11-28 — End: 2020-12-07

## 2020-11-28 MED FILL — LIDOCARE ARM/NECK/LEG 4 % EX PTCH: 4 % | CUTANEOUS | Qty: 1

## 2020-11-28 MED FILL — KETOROLAC TROMETHAMINE 30 MG/ML IJ SOLN: 30 MG/ML | INTRAMUSCULAR | Qty: 1

## 2020-11-28 NOTE — Telephone Encounter (Signed)
Pt seen on my chart, ekg was abnormal, asking what your recommendations are?

## 2020-11-28 NOTE — Telephone Encounter (Signed)
Was in for chest pain, has risk factors,   Mildly abnormal EKG, with septal infarct not seen from prior  Order [facility]

## 2020-11-28 NOTE — Telephone Encounter (Signed)
Pt states she can walk on the treadmill.  Cardiolite ordered if that is okay?

## 2020-11-28 NOTE — Telephone Encounter (Signed)
ok 

## 2020-11-28 NOTE — Telephone Encounter (Signed)
Stress test scheduled 12-08-20  Call to pt, date, time and instructions reviewed and mailed to pt

## 2020-12-08 ENCOUNTER — Inpatient Hospital Stay: Admit: 2020-12-08 | Payer: PRIVATE HEALTH INSURANCE

## 2020-12-08 DIAGNOSIS — R079 Chest pain, unspecified: Secondary | ICD-10-CM

## 2020-12-08 LAB — NM MYOCARDIAL SPECT REST EXERCISE OR RX: Left Ventricular Ejection Fraction: 72

## 2020-12-08 MED ORDER — TECHNETIUM TC 99M SESTAMIBI - CARDIOLITE
Freq: Once | Status: AC | PRN
Start: 2020-12-08 — End: 2020-12-08
  Administered 2020-12-08: 15:00:00 35 via INTRAVENOUS

## 2020-12-08 MED ORDER — TECHNETIUM TC 99M SESTAMIBI - CARDIOLITE
Freq: Once | Status: AC | PRN
Start: 2020-12-08 — End: 2020-12-08
  Administered 2020-12-08: 13:00:00 10.3 via INTRAVENOUS

## 2020-12-09 ENCOUNTER — Ambulatory Visit: Payer: PRIVATE HEALTH INSURANCE

## 2020-12-19 ENCOUNTER — Encounter

## 2020-12-22 ENCOUNTER — Encounter

## 2020-12-22 MED ORDER — HYDROCODONE-ACETAMINOPHEN 5-325 MG PO TABS
5-325 MG | ORAL_TABLET | Freq: Four times a day (QID) | ORAL | 0 refills | Status: DC | PRN
Start: 2020-12-22 — End: 2021-01-19

## 2020-12-22 NOTE — Telephone Encounter (Signed)
Duplicate already sent from previous request

## 2020-12-22 NOTE — Telephone Encounter (Signed)
OARRS reviewed. UDS: + for  Hydrocodone, Fluoxetine, Zolpidem, Lorazepam.   Last seen: 11/11/2020. Follow-up:   Future Appointments   Date Time Provider Department Center   01/12/2021  1:00 PM Simone Curia, APRN - CNP [facility] [place]   05/18/2021  1:45 PM Waymon Amato, MD [facility] [place]   06/15/2021  2:00 PM Vangie Bicker, PA-C [facility] [place]

## 2020-12-22 NOTE — Telephone Encounter (Signed)
OARRS reviewed. UDS: + for Hydrocodone, Fluoxetine, Ambien, Ativan.   Last seen: 11/11/2020. Follow-up:   Future Appointments   Date Time Provider Department Center   01/12/2021  1:00 PM Simone Curia, APRN - CNP [facility] [place]   05/18/2021  1:45 PM Waymon Amato, MD [facility] [place]   06/15/2021  2:00 PM Vangie Bicker, PA-C N [facility] [place]

## 2020-12-24 NOTE — Discharge Instructions (Addendum)
ANESTHESIA INSTRUCTIONS FOLLOWING SURGERY        Since you may experience some intermittent light-headedness for the next several hours, we suggest you plan on bed rest or quiet relaxation this evening. You must have a friend or relative stay with you tonight.    Because of the sedation you have received, it is recommended that you do not drive a motor vehicle, operate any kind of machinery, or sign any contractual agreement for 24 hours following the procedure.    You should not take alcoholic beverages tonight and only take sleeping medication that has been specifically prescribed for you by your physician.  Call office [phone] if you have:  Temperature greater than 100.4  Persistent nausea and vomiting  Severe uncontrolled pain  Redness, tenderness, or signs of infection (pain, swelling, redness, odor or green/yellow discharge around the site)  Difficulty breathing, headache or visual disturbances  Hives  Persistent dizziness or light-headedness  Extreme fatigue  Any other questions or concerns you may have after discharge    In an emergency, call 911 or go to an Emergency Department at a nearby hospital    It is important to bring a complete, current list of your medications to any medical appointments or hospitalizations.    REMINDER:   Carry a list of your medications and allergies with you at all times  Call your pharmacy at least 1 week in advance to refill prescriptions    Diet: Resume your usual diet. Good nutrition promotes healing. Increase fluid intake.     Activity: Rest for 24 hrs then resume normal activity.    HOME MEDICATIONS:      If on blood thinning products such as; Aspirin, NSAIDS, Plavix, Coumadin, Xarelto, Fish Oil, Multi-Vitamins or Herbal Supplements restart in 24 hours      Restart Metformin in 48 hours if you had procedure with dye.      Restart Metformin in 24 hours if no dye used during procedure.        Education Materials Received: {yes/no:310449}  Belongings Returned:  {yes/no:310449}          I understand and acknowledge receipt of the above instructions.                                                                                                                                          Patient or Guardian Signature                                                         Date/Time                                                                                                                                              Physician's or R.N.'s Signature                                                                  Date/Time      The discharge instructions have been reviewed with the patient and/or Guardian.  Patient and/or Guardian signed and retained a printed copy.   Call office [phone] if you have:  Temperature greater than 100.4  Persistent nausea and vomiting  Severe uncontrolled pain  Redness, tenderness, or signs of infection (pain, swelling, redness, odor or green/yellow discharge around the site)  Difficulty breathing, headache or visual disturbances  Hives  Persistent dizziness or light-headedness  Extreme fatigue  Any other questions or concerns you may have after discharge    In an emergency, call 911 or go to an Emergency Department at a nearby hospital    It is important to bring a complete, current list of your medications to any medical appointments or hospitalizations.    REMINDER:   Carry a list of your medications and allergies with you at all times  Call your pharmacy at least 1 week in advance to refill prescriptions    Diet: Resume your usual diet. Good nutrition promotes healing. Increase fluid intake.     Activity: Rest for 24 hrs then resume normal activity.        Education Materials Received: {yes/no:310449}  Belongings Returned: {yes/no:310449}          I understand and acknowledge receipt of the above instructions.                                                                                                                                           Patient or Guardian Signature                                                         Date/Time                                                                                                                                              Physician's or R.N.'s Signature                                                                  Date/Time      The discharge instructions have been reviewed with the patient and/or Guardian.  Patient and/or Guardian signed and retained a printed copy.

## 2020-12-29 ENCOUNTER — Ambulatory Visit: Admit: 2020-12-29 | Payer: PRIVATE HEALTH INSURANCE

## 2020-12-29 ENCOUNTER — Inpatient Hospital Stay: Payer: PRIVATE HEALTH INSURANCE

## 2020-12-29 LAB — POCT GLUCOSE: POC Glucose: 137 mg/dl — ABNORMAL HIGH (ref 70–108)

## 2020-12-29 MED ORDER — LIDOCAINE HCL (PF) 2 % IJ SOLN
2 % | INTRAMUSCULAR | Status: DC | PRN
Start: 2020-12-29 — End: 2020-12-29
  Administered 2020-12-29: 16:00:00 100 via INTRAVENOUS

## 2020-12-29 MED ORDER — PROPOFOL 200 MG/20ML IV EMUL
200 MG/20ML | INTRAVENOUS | Status: DC | PRN
Start: 2020-12-29 — End: 2020-12-29
  Administered 2020-12-29: 16:00:00 40 via INTRAVENOUS
  Administered 2020-12-29: 16:00:00 80 via INTRAVENOUS

## 2020-12-29 MED ORDER — LIDOCAINE HCL 1 % IJ SOLN
1 % | INTRAMUSCULAR | Status: DC | PRN
Start: 2020-12-29 — End: 2020-12-29
  Administered 2020-12-29: 16:00:00 6

## 2020-12-29 MED ORDER — PROPOFOL 200 MG/20ML IV EMUL
200 MG/20ML | INTRAVENOUS | Status: AC
Start: 2020-12-29 — End: ?

## 2020-12-29 MED ORDER — BUPIVACAINE HCL (PF) 0.25 % IJ SOLN
0.25 % | INTRAMUSCULAR | Status: DC | PRN
Start: 2020-12-29 — End: 2020-12-29
  Administered 2020-12-29: 16:00:00 2

## 2020-12-29 MED ORDER — IOPAMIDOL 61 % IV SOLN
61 % | INTRAVENOUS | Status: DC | PRN
Start: 2020-12-29 — End: 2020-12-29
  Administered 2020-12-29: 16:00:00 .5 via INTRAVENOUS

## 2020-12-29 MED ORDER — LIDOCAINE HCL (PF) 2 % IJ SOLN
2 % | INTRAMUSCULAR | Status: AC
Start: 2020-12-29 — End: ?

## 2020-12-29 MED FILL — LIDOCAINE HCL (PF) 2 % IJ SOLN: 2 % | INTRAMUSCULAR | Qty: 5

## 2020-12-29 MED FILL — PROPOFOL 200 MG/20ML IV EMUL: 200 MG/20ML | INTRAVENOUS | Qty: 20

## 2020-12-29 NOTE — Anesthesia Pre-Procedure Evaluation (Signed)
Department of Anesthesiology  Preprocedure Note       Name:  Theresa Vargas   Age:  65 y.o.  DOB:  1955/11/04                                          MRN:  [id number]         Date:  12/29/2020      Surgeon: Juliann Mule):  Laqueta Carina, MD    Procedure: Procedure(s):  Bilateral sacroiliac medial branch block    Medications prior to admission:   Prior to Admission medications    Medication Sig Start Date End Date Taking? Authorizing Provider   HYDROcodone-acetaminophen (NORCO) 5-325 MG per tablet Take 1 tablet by mouth every 6 hours as needed for Pain for up to 30 days. 12/24/20 01/23/21  Burman Freestone, APRN - CNP   losartan (COZAAR) 100 MG tablet Take 100 mg by mouth daily    Historical Provider, MD   hydroCHLOROthiazide (HYDRODIURIL) 25 MG tablet Take 25 mg by mouth daily    Historical Provider, MD   ibuprofen (ADVIL;MOTRIN) 200 MG tablet Take 200 mg by mouth every 6 hours as needed for Pain    Historical Provider, MD   FLUoxetine (PROZAC) 40 MG capsule Take 40 mg by mouth daily    Historical Provider, MD   Semaglutide (OZEMPIC, 0.25 OR 0.5 MG/DOSE, SC) Inject 0.5 mg into the skin once a week    Historical Provider, MD   NOVOLOG FLEXPEN 100 UNIT/ML injection pen Changed to sliding scale 05/09/18   Historical Provider, MD   pravastatin (PRAVACHOL) 20 MG tablet  07/05/18   Historical Provider, MD   Insulin Glargine (LANTUS SC) Inject into the skin 10 units daily increase by 1 unit daily until BS reach 150 goal    Historical Provider, MD   diltiazem (TIAZAC) 300 MG extended release capsule TAKE 1 CAPSULE BY MOUTH ONE TIME A DAY 09/12/17   Historical Provider, MD   hyoscyamine (LEVSIN/SL) 125 MCG sublingual tablet DISSOLVE 1 TABLET UNDER TONGUE EVERY FOUR HOURS AS NEEDED 08/31/17   Historical Provider, MD   CPAP Machine MISC by Does not apply route Please check patient's PAP machine for proper functioning by [employer]. 08/01/17   Jennifer L Nyitray, PA-C   ONE TOUCH ULTRA TEST strip CHECK BLOOD SUGAR ONCE DAILY.  05/31/15   Historical Provider, MD   zolpidem (AMBIEN) 10 MG tablet Take 10 mg by mouth nightly. Takes every night    Historical Provider, MD   LORazepam (ATIVAN) 1 MG tablet Take 1 mg by mouth every 6 hours as needed.      Historical Provider, MD       Current medications:    Current Facility-Administered Medications   Medication Dose Route Frequency Provider Last Rate Last Admin   ??? lidocaine 1 % injection    PRN Laqueta Carina, MD   6 mL at 12/29/20 1034   ??? iopamidol (ISOVUE-300) 61 % injection    PRN Laqueta Carina, MD   0.5 mL at 12/29/20 1034   ??? bupivacaine (PF) (MARCAINE) 0.25 % injection    PRN Laqueta Carina, MD   2 mL at 12/29/20 1129     Facility-Administered Medications Ordered in Other Encounters   Medication Dose Route Frequency Provider Last Rate Last Admin   ??? propofol injection   IntraVENous PRN Shawnie Pons, APRN - CRNA  40 mg at 12/29/20 1128   ??? lidocaine PF 2 % injection   IntraVENous PRN Shawnie Pons, APRN - CRNA   100 mg at 12/29/20 1126       Allergies:  No Known Allergies    Problem List:    Patient Active Problem List   Diagnosis Code   ??? OM (otitis media) H66.90   ??? ETD (eustachian tube dysfunction) H69.80   ??? Dizziness and giddiness R42   ??? Anal skin tag K64.4   ??? Tinnitus of both ears H93.13   ??? Chronic sinusitis J32.9   ??? Acute URI J06.9   ??? Hypertrophy of nasal turbinates J34.3   ??? Nasal septal deviation J34.2   ??? Status post myringotomy with insertion of tube Z96.22   ??? Eustachian tube dysfunction H69.80   ??? Otitis media H66.90   ??? Heart palpitations R00.2   ??? Essential hypertension I10   ??? OSA on CPAP G47.33, Z99.89   ??? Obesity (BMI 30-39.9) E66.9   ??? 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 Depression    ??? 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 H. pylori infection 05/03/2018    stool   ??? Hyperlipidemia    ??? Hypertension    ??? Nausea & vomiting    ??? Panic disorder    ??? PONV (postoperative nausea and vomiting)     ??? Psychiatric problem    ??? Sleep apnea     on cpap       Past Surgical History:        Procedure Laterality Date   ??? BACK INJECTION Bilateral 07/12/2019    bilateral SI MBB # 1 performed by Laqueta Carina, MD at [place]   ??? BACK SURG[place]  [place]28/2015    [place]   ??? CARDIOVASCULAR STRESS TEST  2018   ??? CARDIOVASCULAR STRESS TEST  2016   ??? CHOLECYSTECTOMY  2007    Dr Christean Leaf   ??? COLONOSCOPY      2006-Dr Neidich   ??? HEMORRHOID SURGERY  02/2011    Dr Lincoln Brigham skin tag removal   ??? HYSTERECTOMY (CERVIX STATUS UNKNOWN)  2007    Dr Louis Meckel age 54   ??? NECK SURGERY  2008 [facility]    AC[facility]   ??? NERVE BLOCK  10/29/2014    LUMBAR FACET INJECTION   ??? OTHER SURGICAL HISTORY  11/05/2010    hemmoridectomy-Dr Olt   ??? OTHER SURGICAL HISTORY Bilateral 01/06/2015    Lumbar Facet   ??? OTHER SURGICAL HISTORY  09/2010    hemorrhoid banding-Dr Neidich   ??? OTHER SURGICAL HISTORY  09/2010    hemorrhoid banding-Dr Neidich   ??? PR CREATE EARDRUM OPENING,GEN ANESTH Left 07/19/2016    MYRINGOTOMY TYPANOSTOMY TUBE PLACEMENT, LEFT performed by Eldridge Abrahams, MD at [facility]   ??? SKIN BIOPSY  2007    left upper arm-benign Dr Kenna Gilbert   ??? TYMPANOSTOMY TUBE PLACEMENT  2006,2011,2016       Social History:    Social History     Tobacco Use   ??? Smoking status: Never   ??? Smokeless tobacco: Never   Substance Use Topics   ??? Alcohol use: No  Counseling given: Not Answered      Vital Signs (Current):   Vitals:    12/29/20 1011   BP: (!) 146/79   Pulse: 96   Resp: 16   Temp: 96.8 ??F (36 ??C)   TempSrc: Temporal   SpO2: 95%   Weight: 193 lb 3.2 oz (87.6 kg)   Height: 5' 6.5" (1.689 m)                                              BP Readings from Last 3 Encounters:   12/29/20 (!) 146/79   11/27/20 125/73   11/11/20 124/80       NPO Status: Time of last liquid consumption: 0930                        Time of last solid consumption: 2200                        Date of last liquid consumption:  12/29/20                        Date of last solid food consumption: 12/28/20    BMI:   Wt Readings from Last 3 Encounters:   12/29/20 193 lb 3.2 oz (87.6 kg)   11/27/20 192 lb (87.1 kg)   11/11/20 192 lb (87.1 kg)     Body mass index is 30.72 kg/m??.    CBC:   Lab Results   Component Value Date/Time    WBC 8.9 11/27/2020 07:50 PM    RBC 4.81 11/27/2020 07:50 PM    RBC 4.70 11/26/2020 07:16 AM    HGB 14.2 11/27/2020 07:50 PM    HCT 41.8 11/27/2020 07:50 PM    MCV 86.9 11/27/2020 07:50 PM    RDW 12.4 11/26/2020 07:16 AM    PLT 362 11/27/2020 07:50 PM       CMP:   Lab Results   Component Value Date/Time    NA 136 11/27/2020 07:50 PM    K 3.3 11/27/2020 07:50 PM    CL 96 11/27/2020 07:50 PM    CO2 26 11/27/2020 07:50 PM    BUN 11 11/27/2020 07:50 PM    CREATININE 0.7 11/27/2020 07:50 PM    LABGLOM >60 11/27/2020 08:00 PM    GLUCOSE 151 11/27/2020 07:50 PM    GLUCOSE 133 11/26/2020 07:16 AM    PROT 7.4 11/27/2020 07:50 PM    CALCIUM 9.4 11/27/2020 07:50 PM    BILITOT 0.3 11/27/2020 07:50 PM    ALKPHOS 126 11/27/2020 07:50 PM    AST 26 11/27/2020 07:50 PM    ALT 21 11/27/2020 07:50 PM       POC Tests:   Recent Labs     12/29/20  1013   POCGLU 137*       Coags:   Lab Results   Component Value Date/Time    INR 0.93 11/13/2015 02:07 PM    APTT 31.1 11/13/2015 02:07 PM       HCG (If Applicable): No results found for: PREGTESTUR, PREGSERUM, HCG, HCGQUANT     ABGs: No results found for: PHART, PO2ART, PCO2ART, HCO3ART, BEART, O2SATART     Type & Screen (If Applicable):  No results found for: LABABO, LABRH    Drug/Infectious Status (If Applicable):  Lab Results   Component Value Date/Time    HEPCAB Non-Reactive 06/07/2019 01:26 PM       COVID-19 Screening (If Applicable):   Lab Results   Component Value Date/Time    COVID19 NOT DETECTED 02/20/2020 01:27 PM           Anesthesia Evaluation  Patient summary reviewed and Nursing notes reviewed   history of anesthetic complications: PONV.  Airway: Mallampati: III  TM distance: >3 FB    Neck ROM: full  Mouth opening: > = 3 FB   Dental:          Pulmonary:normal exam  breath sounds clear to auscultation  (+) sleep apnea: on CPAP,                             Cardiovascular:  Exercise tolerance: good (>4 METS),   (+) hypertension:,                   Neuro/Psych:   (+) psychiatric history:            GI/Hepatic/Renal: Neg GI/Hepatic/Renal ROS            Endo/Other:    (+) Diabetes, : arthritis:., .          Pt had no PAT visit       Abdominal:             Vascular: negative vascular ROS.         Other Findings:           Anesthesia Plan      MAC     ASA 3       Induction: intravenous.      Anesthetic plan and risks discussed with patient.      Plan discussed with CRNA.                    [place], DO   12/29/2020

## 2020-12-29 NOTE — Progress Notes (Signed)
1132-Patient to Phase II via cart. Report received from [facility]. Patient drowsy but responsive.Vitals obtained and stable. Respirations even and unlabored on room air. Patient denies pain, nausea, numbness and tingling. Patient able to move all extremities. No drainage noted at injection sites. Patient instructed to stay in bed. Instructed on call light use.  1135-Patient provided with snack and drink. Denies needs. Call light in reach.  1155-IV removed with no complications. Patient getting dressed at bedside.  1203-Patient meets discharge criteria.  Discharged in stable condition with responsible driver. All belongings given to patient. Patient ambulated to car with assistance from RN. Patient tolerated well.

## 2020-12-29 NOTE — Op Note (Signed)
Pre-Procedure Note    Patient Name: Theresa Vargas   Date of Birth:[birth year]  Medical Record Number: [id number]  Date: 12/29/20     Indication:  SI pain    Consent: On file.    Vital Signs:   Vitals:    12/29/20 1011   BP: (!) 146/79   Pulse: 96   Resp: 16   Temp: 96.8 ??F (36 ??C)   SpO2: 95%       Past Medical History:   has a past medical history of Depression, Diabetes mellitus ([place]), H. pylori infection, Hyperlipidemia, Hypertension, Nausea & vomiting, Panic disorder, PONV (postoperative nausea and vomiting), Psychiatric problem, and Sleep apnea.    Past Surgical History:   has a past surgical history that includes Cholecystectomy (2007); Neck surgery (2008 [place]); Tympanostomy tube placement (2006,2011,2016); skin biopsy (2007); Colonoscopy; Hemorrhoid surgery (02/2011); other surgical history (11/05/2010); Nerve Block (10/29/2014); other surgical history (Bilateral, 01/06/2015); back surgery (11/28/2015); pr create eardrum opening,gen anesth (Left, 07/19/2016); other surgical history (09/2010); other surgical history (09/2010); cardiovascular stress test (2018); cardiovascular stress test (2016); Back Injection (Bilateral, 07/12/2019); and Hysterectomy (2007).    Pre-Sedation Documentation and Exam:   Vital signs have been reviewed (see flow sheet for vitals).     Sedation/ Anesthesia Plan: MAC    Patient is an appropriate candidate for plan of sedation: yes       Preoperative Diagnosis:  Bilateral sacroiliitis.     Postoperative Diagnosis: Bilateral  Sacroiliitis.     Procedure Performed:  Bilateral sacroiliac joint injection under fluoroscopy guidance # 2.        Indication for the Procedure:  The patient failed conservative management  for pain in low back.  The patient is tender over the Bilateral SI joint.  Patrick's test is positive on the Bilateral side.  As patient is not responding to conservative management and pain is interfering with activities of daily living we decided to proceed with  SI joint injection. The procedure and risks were discussed with the patient and an informed consent was obtained.     Procedure:     A meaningful communication was kept up with the patient throughout the procedure.  The patient is placed in prone position.  Skin over the back was prepped and draped in sterile manner.  Then using fluoroscopy the Bilateral sacroiliac joint was identified. Then the angle of the fluoroscopy was adjusted such that the view of the caudal aspect of the joint space was optimized.  Then skin and deep tissues over the caudal aspect of the joint were infiltrated with 6 ml of 1% lidocaine. The #22-gauge, 3-1/2 inch spinal needle was introduced through the skin wheal and directed such that the tip of the needle lies in the joint space. This was confirmed by injecting 0.5 ml of Omnipaque-180 through the needle in divided doses  and observing the spread of the contrast along the joint space. Then after negative aspiration a total of 80 mg of depomedrol with 2 ml of  0.25% Marcaine was injected through the needle in divided doses. The needle is removed and a Band-Aid was placed over the needle insertion site.  EBL-0  The patient tolerated the procedure well and vital signs remained stable.  The patient was discharged home in stable condition and will be followed in the pain clinic in the next few weeks or further planning.       Electronically signed by Laqueta Carina, MD on 12/29/20 at 11:25 AM EST

## 2020-12-29 NOTE — H&P (Signed)
[facility]  History and Physical Update    Pt Name: Theresa Vargas  MRN: [id number]  Birthdate: 1955/11/17  Date of evaluation: 12/29/2020      I have examined the patient and reviewed the H&P/Consult and there are no changes to the patient or plans.        Electronically signed by Halina Andreas, MD on 12/29/2020 at 11:22 AM

## 2020-12-29 NOTE — H&P (Signed)
H&P      Patient continues to have pain in low back axial and into SI area- constant pain and aching pain throughout leg. Pain level depends on activity      Has been more active walking every other day for about 30 minutes/ up to 2 miles at a time. States this increases pain a little.   Pain increases with bending, lifting, twisting , walking, standing, getting up and down, and housework or working at job.     Continues Norco prn, motrin prn which remains effective     Has been having depression and continues psychosocial management with psychology and psychiatry   Medications reviewed. Patient denies side effects with medications. Patient states she is taking medications as prescribed. Shedenies receiving pain medications from other sources. She denies any ER visits since last visit.     Pain scale with out pain medications or at its worst is 7-9/10.  Pain scale with pain medications or at its best is 3/10.  Last dose of Norco was today   Drug screen reviewed from 09/02/2020 and was appropriate  Pill count completed  today and WNL: Yes        The patienthas No Known Allergies.        Subjective:      Review of Systems   Constitutional: Negative.    Musculoskeletal:  Positive for arthralgias, back pain, gait problem, joint swelling and myalgias. Negative for neck pain and neck stiffness.        No assist devices    Neurological:  Negative for dizziness, weakness, numbness and headaches.   Psychiatric/Behavioral:  Positive for dysphoric mood. Negative for sleep disturbance. The patient is nervous/anxious.       Objective:      Vitals       Vitals:     11/11/20 1251   BP: 124/80   Weight: 192 lb (87.1 kg)   Height: 5' 6.5" (1.689 m)            Physical Exam  Constitutional:       General: She is not in acute distress.     Appearance: Normal appearance. She is well-developed.   HENT:      Head: Normocephalic and atraumatic.      Right Ear: External ear normal.      Left Ear: External ear normal.      Nose: Nose normal.    Eyes:      Conjunctiva/sclera: Conjunctivae normal.      Pupils: Pupils are equal, round, and reactive to light.   Neck:      Thyroid: No thyromegaly.      Comments: Decreased range of motion with ear to shoulder both sides, tender posterior over bone   Cardiovascular:      Rate and Rhythm: Normal rate and regular rhythm.      Pulses: Normal pulses.      Heart sounds: Normal heart sounds. No murmur heard.    No friction rub. No gallop.   Pulmonary:      Effort: Pulmonary effort is normal. No respiratory distress.      Breath sounds: Normal breath sounds.   Abdominal:      General: Bowel sounds are normal.      Palpations: Abdomen is soft.   Musculoskeletal:         General: Tenderness present. No deformity.      Right shoulder: No tenderness. Normal range of motion.      Left shoulder: Tenderness and  bony tenderness present. No swelling. Decreased range of motion. Decreased strength.      Left upper arm: No tenderness or bony tenderness.      Left elbow: Normal range of motion.      Left forearm: No tenderness or bony tenderness.      Cervical back: Normal range of motion and neck supple. No tenderness.      Thoracic back: No tenderness. Normal range of motion.      Lumbar back: Tenderness and bony tenderness present. Decreased range of motion. Negative right straight leg raise test and negative left straight leg raise test.        Back:     Right knee: Normal range of motion. No tenderness.      Left knee: Normal range of motion. No tenderness.   Skin:     General: Skin is warm and dry.      Findings: No erythema or rash.           Neurological:      General: No focal deficit present.      Mental Status: She is alert and oriented to person, place, and time.      Sensory: Sensory deficit present.      Motor: Weakness present. No atrophy or abnormal muscle tone.      Coordination: Coordination abnormal.      Gait: Gait normal.      Deep Tendon Reflexes: Reflexes are normal and symmetric.      Reflex Scores:        Tricep reflexes are 2+ on the right side and 2+ on the left side.       Bicep reflexes are 2+ on the right side and 2+ on the left side.       Brachioradialis reflexes are 2+ on the right side and 2+ on the left side.       Patellar reflexes are 2+ on the right side and 2+ on the left side.       Achilles reflexes are 2+ on the right side and 2+ on the left side.     Comments: Bilateral lower and right upper extremity 5/5               Psychiatric:         Behavior: Behavior normal.         Thought Content: Thought content normal.      Comments: Depressed mood       FABER  Patricks test  positive  Yeoman's  or Gaenslen's positive     Assessment:      1. SI (sacroiliac) pain    2. Sacroiliac inflammation (HCC)    3. Spondylosis of lumbar region without myelopathy or radiculopathy    4. Spinal stenosis of lumbar region with neurogenic claudication    5. Lumbar post-laminectomy syndrome    6. Chronic pain syndrome    7. Chronic, continuous use of opioids       Plan:      OARRS reviewed. Current MED: 20.00  Patient was offered naloxone for home.   Discussed long term side effects of medications, tolerance, dependency and addiction.  Previous UDS reviewed  UDS preformed today for compliance.  Patient told can not receive any pain medications from any other source.  No evidence of abuse, diversion or aberrant behavior.  Medications and/or procedures to improve function and quality of life- patient understanding with this and that may not be pain free  Discussed with patient about  safe storage of medications at home  Discussed possible weaning of medication dosing dependent on treatment/procedure results.   Discussed with patient about risks with procedure including infection, reaction to medication, increased pain, or bleeding.  Procedure notes reviewed in detail.   Received 80% relief of low back pain and SI pain from SI MBB # 1 for over a month with improvement of mobility. Had issues in past with insurance coverage but  states this has resolved.   Plan Bilateral SI mbb # 2 for diagnostic purpose. Procedure discussed in detail.   Discussed updating imaging (MRI and SCS again). Patient does not want. Also discussed surgery referral again depending on relief from SI injection   Will need to hold NSAID prior to procedure   Continue Norco 5/325 QID prn- filled 10/23/2020 and has plenty. Patient is compliant            Return for Bilateral SI mbb # 2. , follow up after procedure.

## 2020-12-29 NOTE — Anesthesia Post-Procedure Evaluation (Signed)
Department of Anesthesiology  Postprocedure Note    Patient: Theresa Vargas  MRN: [id number]  Birthdate: Oct 23, 1955  Date of evaluation: 12/29/2020      Procedure Summary     Date: 12/29/20 Room / Location: [facility] OR 03 / [facility]    Anesthesia Start: 6962 Anesthesia Stop: 9528    Procedure: Bilateral sacroiliac medial branch block (Bilateral) Diagnosis:       Sacroiliac pain      (Sacroiliac pain [M53.3])    Surgeons: Laqueta Carina, MD Responsible Provider: Gildardo Pounds Docia Klar, DO    Anesthesia Type: MAC ASA Status: 3          Anesthesia Type: MAC    Aldrete Phase I:      Aldrete Phase II: Aldrete Score: 9      Anesthesia Post Evaluation    Patient location during evaluation: bedside  Patient participation: complete - patient participated  Level of consciousness: awake and alert  Pain score: 1  Airway patency: patent  Nausea & Vomiting: no nausea and no vomiting  Complications: no  Cardiovascular status: hemodynamically stable and blood pressure returned to baseline  Respiratory status: spontaneous ventilation, room air and acceptable  Hydration status: stable

## 2021-01-12 ENCOUNTER — Ambulatory Visit: Admit: 2021-01-12 | Discharge: 2021-01-12 | Payer: PRIVATE HEALTH INSURANCE | Attending: Family

## 2021-01-12 DIAGNOSIS — M533 Sacrococcygeal disorders, not elsewhere classified: Secondary | ICD-10-CM

## 2021-01-12 NOTE — Progress Notes (Signed)
[facility] [place] [facility]  [facility]  [address]  [place] [address]  Dept: [phone]  Dept Fax: [phone]  Loc: [phone]    Visit Date: 01/12/2021    Functionality Assessment/Goals Worksheet     On a scale of 0 (Does not Interfere) to 10 (Completely Interferes)     1.  Which number describes how during the past week pain has interfered with       the following:  A.  General Activity:  0  B.  Mood: 0  C.  Walking Ability:  0  D.  Normal Work (Includes both work outside the home and housework):  0  E.  Relations with Other People:   0  F.  Sleep:   2  G.  Enjoyment of Life:   0    2.  Patient Prefers to Take their Pain Medications:     []   On a regular basis   [x]   Only when necessary    []   Does not take pain medications    3.  What are the Patient's Goals/Expectations for Visiting Pain Management?     []   Learn about my pain    [x]   Receive Medication     Physical Therapy     []   Treat Depression   [x] []   Receive Injections    []   Treat Sleep   []   Deal with Anxiety and Stress   []   Treat Opoid Dependence/Addiction   []   Other:      HPI:   Theresa Vargas is a 65 y.o. female is here today for    Chief Complaint: Low back pain, SI pain     HPI   FU for bilateral SI MBB # 2 from 12/29/2020. States that she is receiving 95% relief of SI pain and low back pain from this procedure and continues to receive over 80% relief at this time. States overall pain is better and less pain with activity and able to do more. States she has not tried walking a lot yet or exercise but she is going to try it. She is concerned about billing of this procedure it appears that her insurance wont cover the RFA.     Still some spikes of pain worse in morning and after a lot of activity   Pain increases with bending, lifting, twisting , walking, standing, and housework or working at job, worse in the morning    Continues Norco prn, motrin prn which  remains effective. States since procedure has been taking much less.     Patient states that she may be going to next month to get her new house set up for 2 months next month   Medications reviewed. Patient denies side effects with medications. Patient states she is taking medications as prescribed. Shedenies receiving pain medications from other sources. She had 1 ER visit since last visit for chest pain     Pain scale with out pain medications or at its worst is 5-6/10.  Pain scale with pain medications or at its best is 0/10.  Last dose of Norco was today   Drug screen reviewed from 10/11/022 and was appropriate  Pill count completed  today and WNL: Yes      The patienthas No Known Allergies.      Subjective:      Review of Systems   Constitutional: Negative.    Eyes: Negative.  Respiratory: Negative.     Cardiovascular: Negative.  Negative for chest pain, palpitations and leg swelling.   Gastrointestinal: Negative.    Musculoskeletal:  Positive for arthralgias, back pain, gait problem, joint swelling and myalgias. Negative for neck pain and neck stiffness.        No assist devices    Neurological:  Negative for dizziness, weakness, numbness and headaches.   Psychiatric/Behavioral:  Positive for dysphoric mood. Negative for sleep disturbance. The patient is nervous/anxious.      Objective:     Vitals:    01/12/21 1300   BP: 122/80   Weight: 193 lb (87.5 kg)   Height: 5' 6.5" (1.689 m)       Physical Exam  Constitutional:       General: She is not in acute distress.     Appearance: Normal appearance. She is well-developed.   HENT:      Head: Normocephalic and atraumatic.      Right Ear: External ear normal.      Left Ear: External ear normal.      Nose: Nose normal.   Eyes:      Conjunctiva/sclera: Conjunctivae normal.      Pupils: Pupils are equal, round, and reactive to light.   Neck:      Thyroid: No thyromegaly.      Comments: Decreased range of motion with ear to shoulder both sides, tender  posterior over bone   Cardiovascular:      Rate and Rhythm: Normal rate and regular rhythm.      Pulses: Normal pulses.      Heart sounds: Normal heart sounds. No murmur heard.    No friction rub. No gallop.   Pulmonary:      Effort: Pulmonary effort is normal. No respiratory distress.      Breath sounds: Normal breath sounds.   Abdominal:      General: Bowel sounds are normal.      Palpations: Abdomen is soft.   Musculoskeletal:         General: Tenderness present. No deformity.      Right shoulder: No tenderness. Normal range of motion.      Left shoulder: Tenderness and bony tenderness present. No swelling. Decreased range of motion. Decreased strength.      Left upper arm: No tenderness or bony tenderness.      Left elbow: Normal range of motion.      Left forearm: No tenderness or bony tenderness.      Cervical back: Normal range of motion and neck supple. No tenderness.      Thoracic back: No tenderness. Normal range of motion.      Lumbar back: Tenderness and bony tenderness present. Decreased range of motion. Negative right straight leg raise test and negative left straight leg raise test.        Back:       Right knee: Normal range of motion. No tenderness.      Left knee: Normal range of motion. No tenderness.   Skin:     General: Skin is warm and dry.      Findings: No erythema or rash.          Neurological:      General: No focal deficit present.      Mental Status: She is alert and oriented to person, place, and time.      Sensory: Sensory deficit present.      Motor: Weakness present. No atrophy or abnormal muscle tone.  Coordination: Coordination abnormal.      Gait: Gait normal.      Deep Tendon Reflexes: Reflexes are normal and symmetric.      Reflex Scores:       Tricep reflexes are 2+ on the right side and 2+ on the left side.       Bicep reflexes are 2+ on the right side and 2+ on the left side.       Brachioradialis reflexes are 2+ on the right side and 2+ on the left side.       Patellar  reflexes are 2+ on the right side and 2+ on the left side.       Achilles reflexes are 2+ on the right side and 2+ on the left side.     Comments: Bilateral lower and right upper extremity 5/5            Psychiatric:         Behavior: Behavior normal.         Thought Content: Thought content normal.      Comments: Depressed mood      FABER  Patricks test  positive  Yeoman's  or Gaenslen's positive       Assessment:     1. SI (sacroiliac) pain    2. Sacroiliac inflammation (HCC)    3. Lumbar post-laminectomy syndrome    4. Spinal stenosis of lumbar region with neurogenic claudication    5. Spondylosis of lumbar region without myelopathy or radiculopathy    6. Chronic pain syndrome    7. Chronic, continuous use of opioids            Plan:      OARRS reviewed. Current MED: 20.00  Patient was offered naloxone for home.   Discussed long term side effects of medications, tolerance, dependency and addiction.  Previous UDS reviewed  UDS preformed today for compliance.  Patient told can not receive any pain medications from any other source.  No evidence of abuse, diversion or aberrant behavior.  Medications and/or procedures to improve function and quality of life- patient understanding with this and that may not be pain free  Discussed with patient about safe storage of medications at home  Discussed possible weaning of medication dosing dependent on treatment/procedure results.   Discussed with patient about risks with procedure including infection, reaction to medication, increased pain, or bleeding.  Procedure notes reviewed in detail.   Receiving over 85% relief of low back and IS pain from SI MBB # 2 and continues to receive that relief at this time. It appears insurance won't cover her SI RFA   Discussed updating imaging (MRI and SCS again). Patient does not want. Also discussed surgery referral. Pain is tolerable at this time.   Continue Norco 5/325 QID prn- filled 12/24/2020 and has plenty  Is compliant. UDS  ordered     Meds. Prescribed:   No orders of the defined types were placed in this encounter.      Return in about 3 months (around 04/12/2021), or if symptoms worsen or fail to improve, for follow up  for medications.               Electronically signed by Simone Curia, APRN - CNP on12/01/2021 at 1:28 PM

## 2021-01-16 NOTE — Telephone Encounter (Signed)
Patient called back and decided to schedule a f/u appt for before she leaves for [place]. Patient is scheduled for 02/20/2020

## 2021-01-16 NOTE — Telephone Encounter (Signed)
Patient is wanting to r/s appt that is on 04/13/2020. Her and her husband are going to be out of state, in [place] from 02/23/2020 through the 2nd week in April. If she reschedules her appt to later in April, will this cause any problems with her medication refills? DOLV 01/12/2021  Please contact the patient to advise

## 2021-01-19 ENCOUNTER — Encounter

## 2021-01-19 MED ORDER — HYDROCODONE-ACETAMINOPHEN 5-325 MG PO TABS
5-325 MG | ORAL_TABLET | Freq: Four times a day (QID) | ORAL | 0 refills | Status: DC | PRN
Start: 2021-01-19 — End: 2021-02-17

## 2021-01-19 NOTE — Telephone Encounter (Signed)
OARRS reviewed. UDS: + for  fluoxetine, lorazepam, zolpidem, hydrocodone COMPLIANT.   Last seen: 01/12/2021. Follow-up:   Future Appointments   Date Time Provider Department Center   02/19/2021 12:00 PM Simone Curia, APRN - CNP [facility] [place]   05/18/2021  1:45 PM Waymon Amato, MD [facility] [place]   06/15/2021  2:00 PM Vangie Bicker, PA-C [facility] [place]

## 2021-02-17 ENCOUNTER — Encounter

## 2021-02-17 NOTE — Telephone Encounter (Signed)
OARRS reviewed. UDS: + for  Lorazepam, Hydrocodone, Zolpidem, Fluoxetine, Clomipramine.   Last seen: 01/12/2021. Follow-up: 02/19/2021

## 2021-02-17 NOTE — Telephone Encounter (Signed)
Due 02/22/2021

## 2021-02-18 MED ORDER — HYDROCODONE-ACETAMINOPHEN 5-325 MG PO TABS
5-325 MG | ORAL_TABLET | Freq: Four times a day (QID) | ORAL | 0 refills | Status: DC | PRN
Start: 2021-02-18 — End: 2021-02-19

## 2021-02-19 ENCOUNTER — Ambulatory Visit: Admit: 2021-02-19 | Discharge: 2021-02-19 | Payer: PRIVATE HEALTH INSURANCE | Attending: Family

## 2021-02-19 DIAGNOSIS — M533 Sacrococcygeal disorders, not elsewhere classified: Secondary | ICD-10-CM

## 2021-02-19 MED ORDER — HYDROCODONE-ACETAMINOPHEN 5-325 MG PO TABS
5-325 MG | ORAL_TABLET | Freq: Four times a day (QID) | ORAL | 0 refills | Status: DC | PRN
Start: 2021-02-19 — End: 2021-03-23

## 2021-02-19 NOTE — Progress Notes (Signed)
[facility] [place] [facility]  [facility]  [address]  [place] [address]  Dept: [phone]  Dept Fax: [phone]  Loc: [phone]    Visit Date: 02/19/2021    Functionality Assessment/Goals Worksheet     On a scale of 0 (Does not Interfere) to 10 (Completely Interferes)     1.  Which number describes how during the past week pain has interfered with       the following:  A.  General Activity:  5  B.  Mood: 4  C.  Walking Ability:  5  D.  Normal Work (Includes both work outside the home and housework):  4  E.  Relations with Other People:   0  F.  Sleep:   7  G.  Enjoyment of Life:   4    2.  Patient Prefers to Take their Pain Medications:     []   On a regular basis   [x]   Only when necessary    []   Does not take pain medications    3.  What are the Patient's Goals/Expectations for Visiting Pain Management?     []   Learn about my pain    [x]   Receive Medication   []   Physical Therapy     []   Treat Depression   [x]   Receive Injections    []   Treat Sleep   []   Deal with Anxiety and Stress   []   Treat Opoid Dependence/Addiction   []   Other:      HPI:   Theresa Vargas is a 66 y.o. female is here today for    Chief Complaint: Low back pain, SI pain     HPI   1 month FU and patient wanted to be seen as she is going to this Sunday for 3 months.    Continues to has pain in low back axial across- constant aching pain and pain in bilateral Si area- aching and some standing.   Still receiving relief from SI MBB.     Does have ups and downs of pain depending on activity   Pain increases with bending, lifting, twisting , standing, getting up and down, and housework or working at job.    Continues Norco prn, motrin prn which remains effective along with procedure in decreasing pain to a tolerable level.       Medications reviewed. Patient denies side effects with medications. Patient states she is taking medications as prescribed.  Shedenies receiving pain medications from other sources. She denies any ER visits since last visit.    Pain scale with out pain medications or at its worst is 6-8/10.  Pain scale with pain medications or at its best is 0/10.  Last dose of Norco was today   Drug screen reviewed from 01/12/2021 and was appropriate  Pill count completed  today and WNL: Yes      The patienthas No Known Allergies.      Subjective:      Review of Systems   Constitutional: Negative.    Eyes: Negative.    Respiratory: Negative.     Cardiovascular: Negative.  Negative for chest pain, palpitations and leg swelling.   Gastrointestinal: Negative.    Musculoskeletal:  Positive for arthralgias, back pain, gait problem, joint swelling and myalgias. Negative for neck pain and neck stiffness.        No assist devices    Neurological:  Negative  for dizziness, weakness, numbness and headaches.   Psychiatric/Behavioral:  Negative for dysphoric mood and sleep disturbance. The patient is nervous/anxious.      Objective:     Vitals:    02/19/21 1155   BP: 120/80   Weight: 193 lb (87.5 kg)   Height: 5' 6.5" (1.689 m)       Physical Exam  Constitutional:       General: She is not in acute distress.     Appearance: Normal appearance. She is well-developed.   HENT:      Head: Normocephalic and atraumatic.      Right Ear: External ear normal.      Left Ear: External ear normal.      Nose: Nose normal.   Eyes:      Conjunctiva/sclera: Conjunctivae normal.      Pupils: Pupils are equal, round, and reactive to light.   Neck:      Thyroid: No thyromegaly.      Comments: Decreased range of motion with ear to shoulder both sides, tender posterior over bone   Cardiovascular:      Rate and Rhythm: Normal rate and regular rhythm.      Pulses: Normal pulses.      Heart sounds: Normal heart sounds. No murmur heard.    No friction rub. No gallop.   Pulmonary:      Effort: Pulmonary effort is normal. No respiratory distress.      Breath sounds: Normal breath sounds.    Abdominal:      General: Bowel sounds are normal.      Palpations: Abdomen is soft.   Musculoskeletal:         General: Tenderness present. No deformity.      Right shoulder: No tenderness. Normal range of motion.      Left shoulder: Tenderness and bony tenderness present. No swelling. Decreased range of motion. Decreased strength.      Left upper arm: No tenderness or bony tenderness.      Left elbow: Normal range of motion.      Left forearm: No tenderness or bony tenderness.      Cervical back: Normal range of motion and neck supple. No tenderness.      Thoracic back: No tenderness. Normal range of motion.      Lumbar back: Tenderness and bony tenderness present. Decreased range of motion. Negative right straight leg raise test and negative left straight leg raise test.        Back:       Right knee: Normal range of motion. No tenderness.      Left knee: Normal range of motion. No tenderness.   Skin:     General: Skin is warm and dry.      Findings: No erythema or rash.          Neurological:      General: No focal deficit present.      Mental Status: She is alert and oriented to person, place, and time.      Sensory: Sensory deficit present.      Motor: Weakness present. No atrophy or abnormal muscle tone.      Coordination: Coordination abnormal.      Gait: Gait normal.      Deep Tendon Reflexes: Reflexes are normal and symmetric.      Reflex Scores:       Tricep reflexes are 2+ on the right side and 2+ on the left side.  Bicep reflexes are 2+ on the right side and 2+ on the left side.       Brachioradialis reflexes are 2+ on the right side and 2+ on the left side.       Patellar reflexes are 2+ on the right side and 2+ on the left side.       Achilles reflexes are 2+ on the right side and 2+ on the left side.     Comments: Bilateral lower and right upper extremity 5/5            Psychiatric:         Behavior: Behavior normal.         Thought Content: Thought content normal.      Comments: Depressed mood       FABER  Patricks test  positive  Yeoman's  or Gaenslen's positive         Assessment:     1. SI (sacroiliac) pain    2. Sacroiliac inflammation (HCC)    3. Lumbar post-laminectomy syndrome    4. Spinal stenosis of lumbar region with neurogenic claudication    5. Lumbar spondylosis    6. Chronic pain syndrome    7. Chronic, continuous use of opioids    8. Spondylosis of lumbar region without myelopathy or radiculopathy            Plan:      OARRS reviewed. Current MED: 20.00  Patient was offered naloxone for home.   Discussed long term side effects of medications, tolerance, dependency and addiction.  Previous UDS reviewed  UDS preformed today for compliance.  Patient told can not receive any pain medications from any other source.  No evidence of abuse, diversion or aberrant behavior.  Medications and/or procedures to improve function and quality of life- patient understanding with this and that may not be pain free  Discussed with patient about safe storage of medications at home  Discussed possible weaning of medication dosing dependent on treatment/procedure results.   Discussed with patient about risks with procedure including infection, reaction to medication, increased pain, or bleeding.  Procedure notes reviewed in detail.   Continues to receive 80% relief of SI pain from SI MBB # 2. Insurance wont cover RFA   Pain remains tolerable with medications. Discussed updating imaging (MRI and SCS again). Also discussed surgery referral if needed   Continue Norco 5/325 QID prn- ordered refill   Is compliant.   Is going to [place] for 3 months so will FU in 3 months- future prescriptions while in [place] will need to be sent in by Dr. Leticia Clasivera    Meds. Prescribed:   Orders Placed This Encounter   Medications    HYDROcodone-acetaminophen (NORCO) 5-325 MG per tablet     Sig: Take 1 tablet by mouth every 6 hours as needed for Pain for up to 30 days.     Dispense:  120 tablet     Refill:  0     Reduce doses taken as  pain becomes manageable         Return in about 3 months (around 05/20/2021), or if symptoms worsen or fail to improve, for follow up  for medications.               Electronically signed by Simone CuriaBradley J Brigg Cape, APRN - CNP on1/19/2023 at 12:10 PM

## 2021-03-18 ENCOUNTER — Encounter

## 2021-03-18 NOTE — Telephone Encounter (Signed)
Prescription needs sent by Dr. Leticia Clas as patient is in [place]

## 2021-03-18 NOTE — Telephone Encounter (Signed)
OARRS reviewed. UDS: + for  clomipramine; zolpidem; hydrocodone; lorazepam; fluozetine COMPLIANT.   Last seen: 02/19/2021. Follow-up:   Future Appointments   Date Time Provider Department Center   05/14/2021  1:00 PM Simone Curia, APRN - CNP [facility] [place]   05/18/2021  1:45 PM Waymon Amato, MD [facility] [place]   06/15/2021  2:00 PM Vangie Bicker, PA-C [facility] [place]

## 2021-03-19 NOTE — Telephone Encounter (Signed)
Need to put in [place] pharmacy information please

## 2021-03-20 NOTE — Telephone Encounter (Signed)
Patient called to check on this prescription to see if its able to be sent to [facility] for her. She said if not, she asked for it to go [facility] pharmacy in [place].    [facility] [address] [place] - [address] - P [phone] - F [phone]

## 2021-03-20 NOTE — Telephone Encounter (Signed)
Kennedy Bucker called requesting a refill on the following medications:  Requested Prescriptions     Refused Prescriptions Disp Refills    HYDROcodone-acetaminophen (NORCO) 5-325 MG per tablet 120 tablet 0     Sig: Take 1 tablet by mouth every 6 hours as needed for Pain for up to 30 days.     Refused By: Halina Andreas     Reason for Refusal: Other     Pharmacy verified:  .[facility]  [place], [place]     Date of last visit: 02/19/2021  Date of next visit (if applicable): 05/14/2021

## 2021-03-22 ENCOUNTER — Encounter

## 2021-03-23 MED ORDER — HYDROCODONE-ACETAMINOPHEN 5-325 MG PO TABS
5-325 MG | ORAL_TABLET | Freq: Four times a day (QID) | ORAL | 0 refills | Status: AC | PRN
Start: 2021-03-23 — End: 2021-04-22

## 2021-03-23 NOTE — Telephone Encounter (Signed)
Script needs resent as pt. In [place]. Previous script denied. Information is attached.

## 2021-03-23 NOTE — Telephone Encounter (Signed)
Medication confirmed as sent, this request was confirmed a duplicate.

## 2021-03-23 NOTE — Telephone Encounter (Signed)
Patient is in [place] and needs prescription sent by Dr. Leticia Clas. It looks like he did this on 03/23/2021 so may be duplicate. Please evaluate. This prescription was refused.

## 2021-03-23 NOTE — Telephone Encounter (Signed)
OARRS reviewed. UDS: + for  Clomipramine, zolpidem, Hydrocodone, Lorazepam, Fluoxetine.   Last seen: 02/19/2021. Follow-up:   Future Appointments   Date Time Provider Department Center   05/14/2021  1:00 PM Simone Curia, APRN - CNP [facility] [place]   05/18/2021  1:45 PM Waymon Amato, MD [facility] [place]   06/15/2021  2:00 PM Vangie Bicker, PA-C N [facility] [place]

## 2021-04-14 ENCOUNTER — Encounter: Payer: PRIVATE HEALTH INSURANCE | Attending: Family

## 2021-04-17 ENCOUNTER — Encounter

## 2021-04-20 MED ORDER — HYDROCODONE-ACETAMINOPHEN 5-325 MG PO TABS
5-325 MG | ORAL_TABLET | Freq: Four times a day (QID) | ORAL | 0 refills | Status: DC | PRN
Start: 2021-04-20 — End: 2021-04-21

## 2021-04-20 NOTE — Telephone Encounter (Signed)
OARRS reviewed. UDS: + for  clomipramine, zolpidem, hydrocodone, lorazepam, fluoxetine.   Last seen: Visit date not found. Follow-up:   Future Appointments   Date Time Provider Department Center   05/14/2021  1:00 PM Simone Curia, APRN - CNP [facility] [place]   06/08/2021  2:00 PM Waymon Amato, MD [facility] [place]   06/15/2021  2:00 PM Vangie Bicker, PA-C [facility] [place]

## 2021-04-21 MED ORDER — HYDROCODONE-ACETAMINOPHEN 5-325 MG PO TABS
5-325 MG | ORAL_TABLET | Freq: Four times a day (QID) | ORAL | 0 refills | Status: DC | PRN
Start: 2021-04-21 — End: 2021-05-18

## 2021-04-21 NOTE — Telephone Encounter (Signed)
Information attached but script was canceled as [place] signed and pt. Is in [place]. Need you to sign please

## 2021-04-21 NOTE — Addendum Note (Signed)
Addended by: Buena Irish A on: 04/21/2021 09:08 AM     Modules accepted: Orders

## 2021-04-21 NOTE — Telephone Encounter (Signed)
[facility] pharmacy in [place], [place] is calling about the script they received for [facility] for this pt.    He said that they needed clarification. Please contact pharmacy regarding this

## 2021-04-21 NOTE — Telephone Encounter (Signed)
Called pharmacy and script canceled. Resetup script for Dr. Leticia Clas to sign in different encounter.

## 2021-05-14 ENCOUNTER — Encounter: Payer: PRIVATE HEALTH INSURANCE | Attending: Family

## 2021-05-18 ENCOUNTER — Encounter: Payer: PRIVATE HEALTH INSURANCE | Attending: Internal Medicine

## 2021-05-18 ENCOUNTER — Encounter: Admit: 2021-05-18 | Discharge: 2021-05-18 | Payer: PRIVATE HEALTH INSURANCE | Attending: Family

## 2021-05-18 DIAGNOSIS — M961 Postlaminectomy syndrome, not elsewhere classified: Secondary | ICD-10-CM

## 2021-05-18 MED ORDER — HYDROCODONE-ACETAMINOPHEN 5-325 MG PO TABS
5-325 MG | ORAL_TABLET | Freq: Four times a day (QID) | ORAL | 0 refills | Status: DC | PRN
Start: 2021-05-18 — End: 2021-06-15

## 2021-05-18 NOTE — Progress Notes (Signed)
[facility]  [facility]  [address]  [place] [id number]  Dept: [phone]  Dept Fax: [phone]  Loc: [phone]    Visit Date: 05/18/2021    Functionality Assessment/Goals Worksheet     On a scale of 0 (Does not Interfere) to 10 (Completely Interferes)     1.  Which number describes how during the past week pain has interfered with       the following:  A.  General Activity:  5  B.  Mood: 4  C.  Walking Ability:  6  D.  Normal Work (Includes both work outside the home and housework):  6  E.  Relations with Other People:   0  F.  Sleep:   7  G.  Enjoyment of Life:   3    2.  Patient Prefers to Take their Pain Medications:     []   On a regular basis   [x]   Only when necessary    []   Does not take pain medications    3.  What are the Patient's Goals/Expectations for Visiting Pain Management?     []   Learn about my pain    [x]   Receive Medication   []   Physical Therapy     []   Treat Depression   [x]   Receive Injections    []   Treat Sleep   []   Deal with Anxiety and Stress   []   Treat Opoid Dependence/Addiction   []   Other:      HPI:   Theresa Vargas is a 66 y.o. female is here today for    Chief Complaint: Low back pain, SI pain     HPI   3 month FU. Patient just returned from [place] last Monday.  Continues to has pain in low back axial across- constant aching pain and pain in bilateral SI area- aching pain. Worse in left side     States she had a good trip and she was active. Is planning on Moving to [place] in October and getting a new pain management clinic in [place] at that time     Current pain medications remain effective in decreasing pain to a tolerable level. Able to get around.   Pain increases with bending, lifting, twisting , walking, standing, getting up and down, and housework or working at job.    Medications reviewed. Patient denies side effects with medications. Patient states she is taking medications as  prescribed. Shedenies receiving pain medications from other sources. She denies any ER visits since last visit.    Pain scale with out pain medications or at its worst is 6-7/10.  Pain scale with pain medications or at its best is 0-1/10.  Last dose of Norco was today   Drug screen reviewed from 02/19/2021 and was appropriate  Pill count completed  today and WNL: Yes      The patienthas No Known Allergies.      Subjective:      Review of Systems   Constitutional: Negative.    Eyes: Negative.    Respiratory: Negative.     Cardiovascular: Negative.  Negative for chest pain, palpitations and leg swelling.   Gastrointestinal: Negative.    Musculoskeletal:  Positive for arthralgias, back pain, gait problem, joint swelling and myalgias. Negative for neck pain and neck stiffness.        No assist devices    Neurological:  Positive for  numbness. Negative for dizziness, weakness and headaches.   Psychiatric/Behavioral:  Positive for dysphoric mood. Negative for sleep disturbance. The patient is not nervous/anxious.      Objective:     Vitals:    05/18/21 1242   BP: 118/78   Weight: 193 lb (87.5 kg)   Height: 5' 6.5" (1.689 m)       Physical Exam  Constitutional:       General: She is not in acute distress.     Appearance: Normal appearance. She is well-developed.   HENT:      Head: Normocephalic and atraumatic.      Right Ear: External ear normal.      Left Ear: External ear normal.      Nose: Nose normal.   Eyes:      Conjunctiva/sclera: Conjunctivae normal.      Pupils: Pupils are equal, round, and reactive to light.   Neck:      Thyroid: No thyromegaly.      Comments: Decreased range of motion with ear to shoulder both sides, tender posterior over bone   Cardiovascular:      Rate and Rhythm: Normal rate and regular rhythm.      Pulses: Normal pulses.      Heart sounds: Normal heart sounds. No murmur heard.    No friction rub. No gallop.   Pulmonary:      Effort: Pulmonary effort is normal. No respiratory distress.       Breath sounds: Normal breath sounds.   Abdominal:      General: Bowel sounds are normal.      Palpations: Abdomen is soft.   Musculoskeletal:         General: Tenderness present. No deformity.      Right shoulder: No tenderness. Normal range of motion.      Left shoulder: Tenderness and bony tenderness present. No swelling. Decreased range of motion. Decreased strength.      Left upper arm: No tenderness or bony tenderness.      Left elbow: Normal range of motion.      Left forearm: No tenderness or bony tenderness.      Cervical back: Normal range of motion and neck supple. No tenderness.      Thoracic back: No tenderness. Normal range of motion.      Lumbar back: Tenderness and bony tenderness present. Decreased range of motion. Positive right straight leg raise test. Negative left straight leg raise test.        Back:       Right knee: Normal range of motion. No tenderness.      Left knee: Normal range of motion. No tenderness.   Skin:     General: Skin is warm and dry.      Findings: No erythema or rash.          Neurological:      General: No focal deficit present.      Mental Status: She is alert and oriented to person, place, and time.      Sensory: Sensory deficit present.      Motor: Weakness present. No atrophy or abnormal muscle tone.      Coordination: Coordination abnormal.      Gait: Gait normal.      Deep Tendon Reflexes: Reflexes are normal and symmetric.      Reflex Scores:       Tricep reflexes are 2+ on the right side and 2+ on the left side.  Bicep reflexes are 2+ on the right side and 2+ on the left side.       Brachioradialis reflexes are 2+ on the right side and 2+ on the left side.       Patellar reflexes are 2+ on the right side and 2+ on the left side.       Achilles reflexes are 2+ on the right side and 2+ on the left side.     Comments: Bilateral lower and right upper extremity 5/5            Psychiatric:         Behavior: Behavior normal.         Thought Content: Thought content  normal.      Comments: Depressed mood      FABER  Patricks test  positive  Yeoman's  or Gaenslen's positive       Assessment:     1. Lumbar post-laminectomy syndrome    2. Spinal stenosis of lumbar region with neurogenic claudication    3. Spondylosis of lumbar region without myelopathy or radiculopathy    4. SI (sacroiliac) pain    5. Sacroiliac inflammation (HCC)    6. Lumbar spondylosis    7. Chronic pain syndrome    8. Chronic, continuous use of opioids            Plan:      OARRS reviewed. Current MED: 20.00  Patient was offered naloxone for home.   Discussed long term side effects of medications, tolerance, dependency and addiction.  Previous UDS reviewed  UDS preformed today for compliance.  Patient told can not receive any pain medications from any other source.  No evidence of abuse, diversion or aberrant behavior.  Medications and/or procedures to improve function and quality of life- patient understanding with this and that may not be pain free  Discussed with patient about safe storage of medications at home  Discussed possible weaning of medication dosing dependent on treatment/procedure results.   Discussed with patient about risks with procedure including infection, reaction to medication, increased pain, or bleeding.  Procedure notes reviewed in detail   Received good relief from Si MBB but insurance wont cover SI RFA. Gave information on possible SI fusion   Discussed updating imaging (MRI and SCS again). Also discussed surgery referral if needed   Pain remains tolerable with medications. Continue Norco 5/325 QID prn- ordered refill   Is compliant.   Is planning to move to [place] in October and working on establishing pain management there in the future.     Meds. Prescribed:   Orders Placed This Encounter   Medications    HYDROcodone-acetaminophen (NORCO) 5-325 MG per tablet     Sig: Take 1 tablet by mouth every 6 hours as needed for Pain for up to 30 days.     Dispense:  120 tablet     Refill:  0      Reduce doses taken as pain becomes manageable       Return in about 10 weeks (around 07/27/2021), or if symptoms worsen or fail to improve, for follow up  for medications.               Electronically signed by Simone Curia, APRN - CNP on4/17/2023 at 1:22 PM

## 2021-05-20 ENCOUNTER — Inpatient Hospital Stay: Payer: PRIVATE HEALTH INSURANCE

## 2021-05-20 DIAGNOSIS — I10 Essential (primary) hypertension: Secondary | ICD-10-CM

## 2021-05-20 LAB — COMPREHENSIVE METABOLIC PANEL
ALT: 16 U/L (ref 11–66)
AST: 17 U/L (ref 5–40)
Albumin: 4.3 g/dL (ref 3.5–5.1)
Alkaline Phosphatase: 107 U/L (ref 38–126)
BUN: 10 mg/dL (ref 7–22)
CO2: 26 meq/L (ref 23–33)
Calcium: 9 mg/dL (ref 8.5–10.5)
Chloride: 104 meq/L (ref 98–111)
Creatinine: 0.7 mg/dL (ref 0.4–1.2)
Glucose: 148 mg/dL — ABNORMAL HIGH (ref 70–108)
Potassium: 4.2 meq/L (ref 3.5–5.2)
Sodium: 142 meq/L (ref 135–145)
Total Bilirubin: 0.3 mg/dL (ref 0.3–1.2)
Total Protein: 6.6 g/dL (ref 6.1–8.0)

## 2021-05-20 LAB — HEMOGLOBIN A1C
AVERAGE GLUCOSE: 144 mg/dL — ABNORMAL HIGH (ref 70–126)
Hemoglobin A1C: 6.8 % — ABNORMAL HIGH (ref 4.4–6.4)

## 2021-05-20 LAB — ANION GAP: Anion Gap: 12 meq/L (ref 8.0–16.0)

## 2021-05-20 LAB — TRIGLYCERIDES: Triglycerides: 202 mg/dL — ABNORMAL HIGH (ref 0–199)

## 2021-05-20 LAB — GLOMERULAR FILTRATION RATE, ESTIMATED: Est, Glom Filt Rate: >60 mL/min/{1.73_m2} (ref >60)

## 2021-05-21 ENCOUNTER — Encounter: Payer: PRIVATE HEALTH INSURANCE | Attending: Family

## 2021-06-08 ENCOUNTER — Ambulatory Visit: Admit: 2021-06-08 | Discharge: 2021-06-08 | Payer: PRIVATE HEALTH INSURANCE | Attending: Internal Medicine

## 2021-06-08 ENCOUNTER — Encounter: Payer: PRIVATE HEALTH INSURANCE | Attending: Internal Medicine

## 2021-06-08 DIAGNOSIS — I2510 Atherosclerotic heart disease of native coronary artery without angina pectoris: Secondary | ICD-10-CM

## 2021-06-08 NOTE — Progress Notes (Signed)
Pt here for fu stress test    Pt continues with dizziness and lightheaded, ringing in ears,

## 2021-06-08 NOTE — Progress Notes (Signed)
[facility]  [facility]  [address]  [address]  [place] [id number]  Dept: [phone]  Dept Fax: [phone]  Loc: [phone]    Visit Date: 06/08/2021    Ms. Theresa Vargas is a 66 y.o. female  who presented for:  Chief Complaint   Patient presents with    Check-Up     Fu stress test    Hypertension       HPI:   66 yo F c hx of HTN, HLD, DM, Panic disorder is here for a follow up. She used to follow up with Dr. Derrill MemoKato in the past.  Denies any chest pain, sob, palpitations, lightheadedness, orthopnea, PND or pedal edema. Has dizziness, follows up with ENT.  She recently had stress test which showed no ischemia.        Current Outpatient Medications:     HYDROcodone-acetaminophen (NORCO) 5-325 MG per tablet, Take 1 tablet by mouth every 6 hours as needed for Pain for up to 30 days., Disp: 120 tablet, Rfl: 0    losartan (COZAAR) 100 MG tablet, Take 1 tablet by mouth daily, Disp: , Rfl:     hydroCHLOROthiazide (HYDRODIURIL) 25 MG tablet, Take 1 tablet by mouth daily, Disp: , Rfl:     ibuprofen (ADVIL;MOTRIN) 200 MG tablet, Take 1 tablet by mouth every 6 hours as needed for Pain, Disp: , Rfl:     FLUoxetine (PROZAC) 40 MG capsule, Take 1 capsule by mouth daily, Disp: , Rfl:     Semaglutide (OZEMPIC, 0.25 OR 0.5 MG/DOSE, SC), Inject 0.5 mg into the skin once a week, Disp: , Rfl:     NOVOLOG FLEXPEN 100 UNIT/ML injection pen, Changed to sliding scale, Disp: , Rfl:     pravastatin (PRAVACHOL) 20 MG tablet, , Disp: , Rfl:     Insulin Glargine (LANTUS SC), Inject into the skin 10 units daily increase by 1 unit daily until BS reach 150 goal, Disp: , Rfl:     diltiazem (TIAZAC) 300 MG extended release capsule, TAKE 1 CAPSULE BY MOUTH ONE TIME A DAY, Disp: , Rfl: 3    hyoscyamine (LEVSIN/SL) 125 MCG sublingual tablet, DISSOLVE 1 TABLET UNDER TONGUE EVERY FOUR HOURS AS NEEDED, Disp: , Rfl: 5    zolpidem (AMBIEN) 10 MG tablet, Take 1 tablet by mouth nightly. Takes every night, Disp: ,  Rfl:     LORazepam (ATIVAN) 1 MG tablet, Take 1 tablet by mouth every 6 hours as needed., Disp: , Rfl:     CPAP Machine MISC, by Does not apply route Please check patient's PAP machine for proper functioning by DME Company., Disp: 1 each, Rfl: 0    ONE TOUCH ULTRA TEST strip, CHECK BLOOD SUGAR ONCE DAILY., Disp: , Rfl: 3    Past Medical History  Theresa Vargas  has a past medical history of Depression, Diabetes mellitus (HCC), H. pylori infection, Hyperlipidemia, Hypertension, Nausea & vomiting, Panic disorder, PONV (postoperative nausea and vomiting), Psychiatric problem, and Sleep apnea.    Social History  Theresa Vargas  reports that she has never smoked. She has never used smokeless tobacco. She reports that she does not drink alcohol and does not use drugs.    Family History  Theresa Vargas family history includes Alcohol Abuse in her mother; Breast Cancer (age of onset: 6945) in an other family member; COPD in her mother; Depression in her mother; Diabetes in her father, mother, and another family member; Heart Attack in her father; Heart Disease  in an other family member; High Cholesterol in her father and mother; Hypertension in her father, mother, and another family member; Mental Illness in an other family member; Other in an other family member.    Past Surgical History   Past Surgical History:   Procedure Laterality Date    BACK INJECTION Bilateral 07/12/2019    bilateral SI MBB # 1 performed by Halina Andreas, MD at [facility] OR    BACK INJECTION Bilateral 12/29/2020    Bilateral sacroiliac medial branch block performed by Halina Andreas, MD at [facility] OR    BACK SURG[place]  11[place]28/2015    [place]    CARDIOVASCULAR STRESS TEST  2018    CARDIOVASCULAR STRESS TEST  2016    CHOLECYSTECTOMY  2007    Dr Neoma Laming    COLONOSCOPY      2006-Dr [facility]    HEMORRHOID SURGERY  02/2011    Dr Mackey Birchwood skin tag removal    HYSTERECTOMY (CERVIX STATUS UNKNOWN)  2007    Dr Marlou Porch age 63    NECK SURGERY  2008 [place]  [facility]    ACDF    NERVE BLOCK  10/29/2014    LUMBAR FACET INJECTION    OTHER SURGICAL HISTORY  11/05/2010    hemmoridectomy-Dr Olt    OTHER SURGICAL HISTORY Bilateral 01/06/2015    Lumbar Facet    OTHER SURGICAL HISTORY  09/2010    hemorrhoid banding-Dr Neidich    OTHER SURGICAL HISTORY  09/2010    hemorrhoid banding-Dr Neidich    PR TYMPANOSTOMY GENERAL ANESTHESIA Left 07/19/2016    MYRINGOTOMY TYPANOSTOMY TUBE PLACEMENT, LEFT performed by Geoffery Lyons, MD at [facility] OR    SKIN BIOPSY  2007    left upper arm-benign Dr Rosie Fate    TYMPANOSTOMY TUBE PLACEMENT  2006,2011,2016       Subjective:     Review of Systems   Constitutional: Negative for decreased appetite, diaphoresis, fever and malaise/fatigue.   HENT:  Negative for congestion, hearing loss and hoarse voice.    Eyes:  Negative for blurred vision, discharge, double vision and pain.   Cardiovascular:  Negative for chest pain, claudication, cyanosis, dyspnea on exertion, irregular heartbeat, leg swelling, near-syncope, orthopnea, palpitations, paroxysmal nocturnal dyspnea and syncope.   Respiratory:  Negative for cough, hemoptysis, shortness of breath and sputum production.    Endocrine: Negative for cold intolerance, heat intolerance, polydipsia, polyphagia and polyuria.   Musculoskeletal:  Negative for arthritis, back pain, falls, gout, joint pain and joint swelling.   Gastrointestinal:  Negative for bloating, abdominal pain, anorexia, change in bowel habit, bowel incontinence, constipation and diarrhea.   Genitourinary:  Negative for bladder incontinence, dysuria, flank pain, frequency and hematuria.   Neurological:  Negative for difficulty with concentration, disturbances in coordination, focal weakness, headaches, numbness and weakness.   Psychiatric/Behavioral:  Negative for altered mental status and depression.      All others reviewed and are negative.   Objective:     BP 118/69   Pulse 99   Ht 5' 6.5" (1.689 m)   Wt 190 lb 4 oz (86.3 kg)    BMI 30.25 kg/m     Wt Readings from Last 3 Encounters:   06/08/21 190 lb 4 oz (86.3 kg)   05/18/21 193 lb (87.5 kg)   02/19/21 193 lb (87.5 kg)     BP Readings from Last 3 Encounters:   06/08/21 118/69   05/18/21 118/78   02/19/21 120/80  Physical Exam  Vitals and nursing note reviewed.   Constitutional:       Appearance: She is well-developed.   HENT:      Head: Normocephalic and atraumatic.   Eyes:      Pupils: Pupils are equal, round, and reactive to light.   Neck:      Vascular: No JVD.   Cardiovascular:      Rate and Rhythm: Normal rate and regular rhythm.      Heart sounds: Normal heart sounds. No murmur heard.  Pulmonary:      Effort: Pulmonary effort is normal. No respiratory distress.      Breath sounds: Normal breath sounds. No wheezing or rales.   Abdominal:      General: Bowel sounds are normal. There is no distension.      Palpations: Abdomen is soft.      Tenderness: There is no abdominal tenderness.   Musculoskeletal:         General: Normal range of motion.      Cervical back: Normal range of motion and neck supple.   Skin:     General: Skin is warm and dry.   Neurological:      Mental Status: She is alert and oriented to person, place, and time.      Cranial Nerves: No cranial nerve deficit.      Coordination: Coordination normal.       No results found for: CKTOTAL, CKMB, CKMBINDEX    Lab Results   Component Value Date/Time    WBC 8.9 11/27/2020 07:50 PM    RBC 4.81 11/27/2020 07:50 PM    RBC 4.70 11/26/2020 07:16 AM    HGB 14.2 11/27/2020 07:50 PM    HCT 41.8 11/27/2020 07:50 PM    MCV 86.9 11/27/2020 07:50 PM    MCH 29.5 11/27/2020 07:50 PM    MCHC 34.0 11/27/2020 07:50 PM    RDW 12.4 11/26/2020 07:16 AM    PLT 362 11/27/2020 07:50 PM    MPV 8.7 11/27/2020 07:50 PM       Lab Results   Component Value Date/Time    NA 142 05/20/2021 08:49 AM    K 4.2 05/20/2021 08:49 AM    K 3.3 11/27/2020 07:50 PM    CL 104 05/20/2021 08:49 AM    CO2 26 05/20/2021 08:49 AM    BUN 10 05/20/2021 08:49 AM     LABALBU 4.3 05/20/2021 08:49 AM    CREATININE 0.7 05/20/2021 08:49 AM    CALCIUM 9.0 05/20/2021 08:49 AM    LABGLOM >60 05/20/2021 08:49 AM    GLUCOSE 148 05/20/2021 08:49 AM    GLUCOSE 133 11/26/2020 07:16 AM       Lab Results   Component Value Date/Time    ALKPHOS 107 05/20/2021 08:49 AM    ALT 16 05/20/2021 08:49 AM    AST 17 05/20/2021 08:49 AM    PROT 6.6 05/20/2021 08:49 AM    BILITOT 0.3 05/20/2021 08:49 AM    BILIDIR <0.2 01/02/2020 09:47 AM    LABALBU 4.3 05/20/2021 08:49 AM       Lab Results   Component Value Date/Time    MG 2.2 11/27/2020 07:50 PM       Lab Results   Component Value Date    INR 0.93 11/13/2015         Lab Results   Component Value Date/Time    LABA1C 6.8 05/20/2021 08:49 AM       Lab Results  Component Value Date/Time    TRIG 202 05/20/2021 08:49 AM    HDL 36 11/26/2020 07:16 AM    LDLCALC 65 11/26/2020 07:16 AM    LABVLDL 53 11/26/2020 07:16 AM       Lab Results   Component Value Date/Time    TSH 1.480 08/01/2018 11:10 AM         Testing Reviewed:      I haveindividually reviewed the below cardiac tests    EKG:SR, no ST-T changes    ECHO:   Results for orders placed during the hospital encounter of 09/06/14   ECHO Complete 2D W Doppler W Color    Narrative Transthoracic Echocardiography Report (TTE)     Demographics      Patient Name     Theresa Vargas Gender                Female      MR #             [id number]       Race                  Caucasian                                       Ethnicity      Account #        [id number]         Room Number      Accession Number [id number]       Date of Study         09/06/2014      Date of Birth    [birth year]      Referring Physician   Domingo Cocking DO                                                          Bowlus Beverly Gust MD      Age              53 year(s)      Sonographer           Barbette Merino, RDCS                                       Interpreting          Domingo Cocking DO                                    Physician     Procedure    Type of  Study      TTE procedure:ECHOCARDIOGRAM COMPLETE 2D W DOPPLER W COLOR.     Procedure Date  Date: 09/06/2014 Start: 10:10 AM    Study Location: Echo Lab  Technical Quality: Adequate visualization    Indications:Chest pain.    Additional Medical History:Hypertension, Diabetes    Patient Status: Routine    Height: 66 inches Weight: 181 pounds BSA: 1.92 m^2 BMI: 29.21 kg/m^2    BP: 116/76 mmHg     Conclusions      Summary  Systolic function was normal.   Ejection fraction is visually estimated at 55%.   Mildly dilated right ventricle.      Signature      ----------------------------------------------------------------   Electronically signed by Domingo Cocking DO (Interpreting   physician) on 09/06/2014 at 06:28 PM   ----------------------------------------------------------------      Findings      Mitral Valve   Structurally normal mitral valve.   Trace mitral regurgitation is present.      Aortic Valve   Structurally normal aortic valve.      Tricuspid Valve   Tricuspid valve is structurally normal.   trivial tricuspid regurgitation.      Pulmonic Valve   Pulmonic valve is structurally normal.      Left Atrium   Normal size left atrium.      Left Ventricle   Systolic function was normal.   Ejection fraction is visually estimated at 55%.      Right Atrium   The right atrium is of normal size.      Right Ventricle   Mildly dilated right ventricle.      Pericardial Effusion   There is a trivial circumferential pericardial effusion noted.     M-Mode/2D Measurements & Calculations      LV Diastolic    LV Systolic Dimension: 2.7  AV Cusp Separation: 1.6 cmLA   Dimension: 3.8  cm                          Dimension: 2.9 cmAO Root   cm              LV Volume Diastolic: 62 ml  Dimension: 2.4 cm   LV FS:29 %      LV Volume Systolic: 27 ml   LV PW           LV EDV/LV EDV Index: 62   Diastolic: 0.8  ml/32 m^2LV ESV/LV ESV   cm              Index: 27 ml/14 m^2         RV Diastolic Dimension: 3 cm   Septum          EF Calculated:  56.5 %   Diastolic: 0.7                              LA/Aorta: 1.21   cm     Doppler Measurements & Calculations      MV Peak E-Wave: 69.6 cm/s   AV Peak Velocity: 142  LVOT Peak Velocity: 105   MV Peak A-Wave: 49.9 cm/s   cm/s                   cm/s   MV E/A Ratio: 1.39          AV Peak Gradient: 8.07 LVOT Peak Gradient: 4   MV Peak Gradient: 1.94 mmHg mmHg                   mmHg      MV Deceleration Time: 144                          TV Peak E-Wave: 52.8   msec  cm/s                                                      TV Peak A-Wave: 53.3                                                      cm/s   MV E' Septal Velocity: 8.48   cm/s                                               TV Peak Gradient: 1.12   MV A' Septal Velocity: 13.5 AV DVI (Vmax):0.74     mmHg   cm/s   MV E' Lateral Velocity:                            PV Peak Velocity: 69.1   13.6 cm/s                                          cm/s   MV A' Lateral Velocity:                            PV Peak Gradient: 1.91   11.6 cm/s                                          mmHg   E/E' septal: 8.21   E/E' lateral: 5.12     [id number]%[id number]%3d       STRESS: 10/28/16:   Summary   Lexiscan EKG stress test is not suggestive for ischemia.   Calculated gated LVEF 83 %.   The T.I.D. ratio was 1.08 .   Myocardial perfusion imaging is not suggestive for myocardial ischemia.   This study was negative for ischemia.      Recommendation   Clinical correlation is recommended.   Aggressive risk factor management.   Medical management.       CATH:    Assessment/Plan       Diagnosis Orders   1. ASCVD (arteriosclerotic cardiovascular disease)            ASCVD  HTN  HLD  DM    Denies any symptoms  Recently had stress test which showed no ischemia  Reviewed prior workup  Continue statin, losartan, cardizem  BP well  control  The patient is asked to make an attempt to improve diet and exercise patterns to aid in medical management of this problem.  Advised patient to call office or seek immediate medical attention if there is any new onset of  any chest pain, sob, palpitations, lightheadedness, dizziness, orthopnea, PND or pedal edema.         Thank youfor allowing me to participate in the care of this patient.  Please do not hesitate to contact me for any further questions.     Return if symptoms worsen or fail to improve, for Regular follow up, Review testing.       Electronically signed by Melvia Heaps, MD [facility]  06/08/2021 at 10:40 AM

## 2021-06-15 ENCOUNTER — Encounter

## 2021-06-15 ENCOUNTER — Ambulatory Visit: Admit: 2021-06-15 | Discharge: 2021-06-15 | Payer: PRIVATE HEALTH INSURANCE | Attending: Physician Assistant

## 2021-06-15 DIAGNOSIS — G4733 Obstructive sleep apnea (adult) (pediatric): Secondary | ICD-10-CM

## 2021-06-15 NOTE — Progress Notes (Signed)
[facility]      [name]         [id number]  06/15/2021   Chief Complaint   Patient presents with    Follow-up     1 year OSA follow up with download.         Pt of Dr. Jenetta Downer    PAP Download:   Original or initial AHI: 14.3     Date of initial study: 09/20/14      Compliant  100%   Noncompliant 0%     PAP Type CPAP Level  7cmH20   Avg Hrs/Day 8 hours 56 minutes  AHI: 1.2   Recorded compliance dates 05/05/21-06/03/21  Machine/Mfg:   [x]  ResMed    []  Respironics/Dreamstation   Interface:   []  Nasal    []  Nasal pillows   [x]  FFM      Provider:      []  [facility]     [x] [employer]     []  [employer]    []  [employer]    []  [employer]               []  [facility]      []  [employer]    []  [facility]:      []  Other    Neck Size: 13.75 inches  Mallampati 4  ESS:  0  SAQLI: 59    Here is a scan of the most recent download:            Presentation:   Theresa Vargas presents for sleep medicine follow up for obstructive sleep apnea  Since the last visit, Theresa Vargas is doing well with PAP.  She is sleeping well and feels rested. On Ambien per PCP for insomnia.     Equipment issues:  The pressure is  acceptable, the mask is acceptable     Sleep issues:  Do you feel better? Yes  More rested?Yes   Better concentration? yes    Progress History:   Since last visit any new medical issues? No  New ER or hospital visits? No  Any new or changes in medicines? No  Any new sleep medicines? No    Review of Systems -   Review of Systems   Constitutional:  Negative for activity change, appetite change, chills and fever.   HENT:  Negative for congestion and postnasal drip.    Eyes: Negative.    Respiratory:  Negative for cough, chest tightness, shortness of breath, wheezing and stridor.    Cardiovascular:  Negative for chest pain and leg swelling.   Gastrointestinal:  Negative for diarrhea and nausea.   Endocrine: Negative.    Genitourinary: Negative.    Musculoskeletal: Negative.  Negative for arthralgias and back pain.    Skin: Negative.    Allergic/Immunologic: Negative.    Neurological: Negative.  Negative for dizziness and light-headedness.   Psychiatric/Behavioral: Negative.     All other systems reviewed and are negative.     Physical Exam:    BMI:  Body mass index is 30.91 kg/m.    Wt Readings from Last 3 Encounters:   06/15/21 194 lb 6.4 oz (88.2 kg)   06/08/21 190 lb 4 oz (86.3 kg)   05/18/21 193 lb (87.5 kg)     Weight stable / unchanged  Vitals: BP 138/88 (Site: Right Upper Arm, Position: Sitting, Cuff Size: Medium Adult)   Pulse 88   Ht 5' 6.5" (1.689 m)   Wt 194 lb 6.4 oz (88.2 kg)  SpO2 97% Comment: Patient on room air  BMI 30.91 kg/m       Physical Exam  Constitutional:       General: She is not in acute distress.     Appearance: Normal appearance. She is normal weight. She is not ill-appearing.   HENT:      Head: Normocephalic and atraumatic.      Nose: Nose normal.   Eyes:      Extraocular Movements: Extraocular movements intact.      Pupils: Pupils are equal, round, and reactive to light.   Pulmonary:      Effort: Pulmonary effort is normal.   Neurological:      Mental Status: She is alert and oriented to person, place, and time.   Psychiatric:         Attention and Perception: Attention and perception normal.         Mood and Affect: Mood and affect normal.         Speech: Speech normal.         Behavior: Behavior normal.         Thought Content: Thought content normal.         Cognition and Memory: Cognition and memory normal.         Judgment: Judgment normal.         ASSESSMENT/DIAGNOSIS     Diagnosis Orders   1. OSA on CPAP        2. Obesity (BMI 30-39.9)        3. Other insomnia               Plan   Do you need any equipment today? Yes update supplies  - will be moving to [place] in the next few months  - will order new PAP  - Download reviewed and discussed with patient  - She  was advised to continue current positive airway pressure therapy with above described pressure.   - She  advised to keep  good compliance with current recommended pressure to get optimal results and clinical improvement  - Recommend 7-9 hours of sleep with PAP  - She was advised to call DME company regarding supplies if needed.   -She call my office for earlier appointment if needed for worsening of sleep symptoms.   - She was instructed on weight loss  - Nela was educated about my impression and plan. Patient verbalizesunderstanding.  We will see [name] back if she does not move  Information added by my medical assistant/LPN was reviewed today       Bobby Rumpf, MPAS  [facility]  06/15/2021

## 2021-06-16 ENCOUNTER — Encounter

## 2021-06-17 MED ORDER — HYDROCODONE-ACETAMINOPHEN 5-325 MG PO TABS
5-325 MG | ORAL_TABLET | Freq: Four times a day (QID) | ORAL | 0 refills | Status: DC | PRN
Start: 2021-06-17 — End: 2021-07-20

## 2021-06-17 NOTE — Telephone Encounter (Signed)
Phoned and spoke with patient, notified her that we do not have exact dates for DMEs if she wants to call around she can let us know if she finds someone who can get her a machine sooner. Patient voiced understanding.

## 2021-06-17 NOTE — Telephone Encounter (Signed)
[place]ARRS reviewed. UDS: + for  Fluoxetine, Clomipramine, Zolpidem, Lorazepam, Hydrocodone.   Last seen: 05/18/2021. Follow-up:   Future Appointments   Date Time Provider [place]   07/27/2021  1:00 PM Burman Freestone, APRN - CNP [facility] [place]

## 2021-06-22 NOTE — Telephone Encounter (Signed)
From: Cheryll Cockayne  To: Burman Freestone  Sent: 06/19/2021 4:29 PM EDT  Subject: appointment    Hi, we are going on vacation june 20 and will not be home jun 26 so i rescheduled for june 19 if that is okay and wont be an issue with med refill.   thanks sandy [employer]

## 2021-06-25 ENCOUNTER — Emergency Department: Admit: 2021-06-26 | Payer: PRIVATE HEALTH INSURANCE

## 2021-06-25 ENCOUNTER — Inpatient Hospital Stay
Admission: EM | Admit: 2021-06-25 | Discharge: 2021-06-26 | Disposition: A | Discharge status: Home / Self Care | Payer: PRIVATE HEALTH INSURANCE | Admitting: Internal Medicine

## 2021-06-25 DIAGNOSIS — A419 Sepsis, unspecified organism: Secondary | ICD-10-CM

## 2021-06-25 DIAGNOSIS — R42 Dizziness and giddiness: Secondary | ICD-10-CM

## 2021-06-25 LAB — CBC WITH AUTO DIFFERENTIAL
Basophils Absolute: 0 10*3/uL (ref 0.0–0.1)
Basophils: 0.3 %
Eosinophils Absolute: 0 10*3/uL (ref 0.0–0.4)
Eosinophils: 0.1 %
Hematocrit: 43.7 % (ref 37.0–47.0)
Hemoglobin: 14.7 gm/dl (ref 12.0–16.0)
Immature Grans (Abs): 0.02 10*3/uL (ref 0.00–0.07)
Immature Granulocytes: 0.3 %
Lymphocytes Absolute: 0.4 10*3/uL — ABNORMAL LOW (ref 1.0–4.8)
Lymphocytes: 5.4 %
MCH: 29.3 pg (ref 26.0–33.0)
MCHC: 33.6 gm/dl (ref 32.2–35.5)
MCV: 87.2 fL (ref 81.0–99.0)
MPV: 8.9 fL — ABNORMAL LOW (ref 9.4–12.4)
Monocytes Absolute: 0.3 10*3/uL — ABNORMAL LOW (ref 0.4–1.3)
Monocytes: 3.8 %
Platelets: 233 10*3/uL (ref 130–400)
RBC: 5.01 10*6/uL (ref 4.20–5.40)
RDW-CV: 12.4 % (ref 11.5–14.5)
RDW-SD: 39.2 fL (ref 35.0–45.0)
Seg Neutrophils: 90.1 %
Segs Absolute: 6.2 10*3/uL (ref 1.8–7.7)
WBC: 6.9 10*3/uL (ref 4.8–10.8)
nRBC: 0 /100 wbc

## 2021-06-25 LAB — POCT GLUCOSE
POC Glucose: 178 mg/dl — ABNORMAL HIGH (ref 70–108)
QC OK?: 178

## 2021-06-25 MED ORDER — ONDANSETRON HCL 4 MG/2ML IJ SOLN
4 MG/2ML | Freq: Once | INTRAMUSCULAR | Status: AC
Start: 2021-06-25 — End: 2021-06-25
  Administered 2021-06-25: 23:00:00 4 mg via INTRAVENOUS

## 2021-06-25 MED ORDER — SODIUM CHLORIDE 0.9 % IV BOLUS
0.9 % | Freq: Once | INTRAVENOUS | Status: AC
Start: 2021-06-25 — End: 2021-06-25
  Administered 2021-06-25: 23:00:00 1000 mL via INTRAVENOUS

## 2021-06-25 MED FILL — ONDANSETRON HCL 4 MG/2ML IJ SOLN: 4 MG/2ML | INTRAMUSCULAR | Qty: 2

## 2021-06-25 NOTE — ED Notes (Signed)
Patient medicated per MAR at this time. Patient also provided an update on the plan of care at this time. Patient denies further questions. Patient VSS. Respirs are easy and no acute distress noted. Call light witihin reach. Will continue to monitor.       Sherilyn Banker, RN  06/25/21 2218

## 2021-06-25 NOTE — ED Notes (Signed)
Pt ambulatory to room 4 here for N/V/D bodyaches, intermittent chest and back pain. States she has tried sips of ginger ale cant keep it down.      Claiborne Billings, RN  06/25/21 1850

## 2021-06-25 NOTE — ED Notes (Signed)
Pt to ED via EMS from urgent care with c/o abdominal pain, back pain, headache and body aches. Pt states the pain is intermittent in her abdomen and intermittently starts from her lower back to her neck. Pt states the headache is the worst right now and a 8 out of 10 throbbing pain. Pt arrived to ED with IV in place and fluid bolus running. Pt received zofran pta. States she is still nauseous. Upon asking triage questions, pt states she is not suicidal today but has had suicidal thoughts in the last month. Pt states "I have a history of depression- do I really have to say what I was thinking a month ago?" Pt states she is not currently suicidal and does not want to speak to a Child psychotherapist- states "I have a psychiatrist that I see regularly." Pt tachycardic upon arrival- EKG complete at urgent care     Humphrey Rolls, RN  06/25/21 1914

## 2021-06-25 NOTE — H&P (Addendum)
Hospitalist - History & Physical      Patient: Theresa Vargas    Unit/Bed:04/004A  Date of Birth: [birth year]  MRN: [id number]   Acct: [id number]   PCP: Florene Route. Bowlus, MD    Date of Service: Pt seen/examined on 06/25/21  and Admitted to Observation with expected LOS less than two midnights due to medical therapy.     Chief Complaint:  nausea/vomiting, abdominal pain and lightheadedness    Assessment and Plan:-  ?Severe Sepsis/SIRS+ in setting of norovirus infection: has abdominal pain, nausea, vomiting, myalgia and diarrhea. SIRS 2/4 criteria met (temp of 100.6, HR up to 128, WBC of 6.9 with lymphocyte of 0.4 and monocyte of 0.3 suggestive of viral infection), lactic acid of 2.6. .  C. difficile negative.  GI panel positive for norovirus.  CT abd/pelvis showed liquid stool throughout much of the colon compatible with diarrhea. Received 30 cc/kg fluid resuscitation.  Supportive care with IV fluids and Bentyl.  Follow-up blood cultures. Will monitor orthostatic VS as patient reported being significantly lightheaded.   Headache: likely tension/sinus congestion headache as reproducible on physical exam.  Has neck pain which is reproducible but no photophobia or phonophobia.  Negative Kernig and Brudzinski sign. Will treat with toradol PRN for now, can be de-escalated once improves. CT head negative. If headache persists, consider further workup (MRI/LP). On robitussin DM PRN.   Transaminitis: AST of 866, alk phos of 226 and ALT of 659 with normal bilirubin of 1.0.  R factor of 8.7, suggestive of hepatocellular injury. Negative hepatitis panel. Likely shock liver, in setting of ongoing nausea/vomiting and diarrhea. Holding home statin and acetaminophen. Will check acetaminophen/ETOH level. Denies ETOH use. Consider abdominal ultrasound in am, if transaminitis does not improve with IVF. Will use toradol with caution, low threshold to discontinue if liver function worsens.   HAGMA: due to above. On IVF. Will  monitor.  HTN: Continue home diltiazem and losartan.  Holding home hydrochlorothiazide due to dehydration.  Depression with anxiety: Continue home fluoxetine with caution.  Initial QTc was 592 but it improved to 462 on repeat. Continue home ativan PRN.   Diabetes mellitus type 2: Continue home Lantus 10 units every morning with low-dose sliding scale insulin.  Insomnia: Continue home Ambien nightly  OSA: continue home CPAP  History of IBS: continue home hyoscyamine PRN.   HLD: holding home statin due to above.   Debility: PT/OT      History Of Present Illness:    Theresa Vargas is a 66 y.o. female who presents with complaint of nausea/vomiting, abdominal pain and lightheadedness.    She woke up this morning, then she had coffee and breakfast. After which she all of a sudden had onset of nausea/vomiting (2 X, non bloody, non bilious), and had diarrhea (3 X, non bloody, loose in nature). Also been having abdominal pain. Pain is located at lower abdominal region, it is intermittent, sharp in nature, 10/10 in severity. No prior hx of similar pain. It radiates to back and legs. Also reporting myalgias all over the body. Chills but no fever. No sick contacts. Reports lightheadedness when standing. No chest pain. Reports upper and lower back pain which is exclusive from abdominal pain. She has headache, which goes from front of head to neck. It is constant in nature. It started week ago, will come and go, but today it is constant. No photophobia or phonophobia. It is sharp in nature. Toradol helped it go from 10 to 5. No vision changes.  No numbness/tingling. No motor strength changes, she just feels weak in general. No room spinning sensation. No Cough. Reports SOB but thinks it is anxiety related. Reports congestion. No sick contacts. No travel recently or unusual food intake. Denies any antibiotic courses recently. Had poor PO intake today. No issues with urination. No dysuria.     At ER, she was noted to be  tachycardic, febrile and was found to have lactic acid of 2.6, thus findings concerning for sepsis, thus hospitalist team were contacted for admission.       Past Medical History:        Diagnosis Date    Depression     Diabetes mellitus ([place])     type 2-on Lantus    H. pylori infection 05/03/2018    stool    Hyperlipidemia     Hypertension     Nausea & vomiting     Panic disorder     PONV (postoperative nausea and vomiting)     Psychiatric problem     Sleep apnea     on cpap       Past Surgical History:        Procedure Laterality Date    BACK INJECTION Bilateral 07/12/2019    bilateral SI MBB # 1 performed by Laqueta Carina, MD at [facility] Bilateral 12/29/2020    Bilateral sacroiliac medial branch block performed by Laqueta Carina, MD at [facility]  11/28/2015    new [place] TEST  2018    CARDIOVASCULAR STRESS TEST  2016    CHOLECYSTECTOMY  2007    Dr Christean Leaf    COLONOSCOPY      2006-Dr Edison  02/2011    Dr Lincoln Brigham skin tag removal    HYSTERECTOMY (CERVIX STATUS UNKNOWN)  2007    Dr Louis Meckel age 9    [place]  [facility]    ACDF    NERVE BLOCK  10/29/2014    LUMBAR FACET INJECTION    OTHER SURGICAL HISTORY  11/05/2010    hemmoridectomy-Dr Olt    OTHER SURGICAL HISTORY Bilateral 01/06/2015    Lumbar Facet    OTHER SURGICAL HISTORY  09/2010    hemorrhoid banding-Dr Neidich    OTHER SURGICAL HISTORY  09/2010    hemorrhoid banding-Dr Neidich    PR TYMPANOSTOMY GENERAL ANESTHESIA Left 07/19/2016    MYRINGOTOMY TYPANOSTOMY TUBE PLACEMENT, LEFT performed by Eldridge Abrahams, MD at [facility]  2007    left upper arm-benign Dr Kenna Gilbert    TYMPANOSTOMY TUBE PLACEMENT  2006,2011,2016       Home Medications:   No current facility-administered medications on file prior to encounter.     Current Outpatient Medications on File Prior to Encounter   Medication Sig Dispense Refill     HYDROcodone-acetaminophen (NORCO) 5-325 MG per tablet Take 1 tablet by mouth every 6 hours as needed for Pain for up to 30 days. 120 tablet 0    losartan (COZAAR) 100 MG tablet Take 1 tablet by mouth daily      hydroCHLOROthiazide (HYDRODIURIL) 25 MG tablet Take 1 tablet by mouth daily      ibuprofen (ADVIL;MOTRIN) 200 MG tablet Take 1 tablet by mouth every 6 hours as needed for Pain      FLUoxetine (PROZAC) 40 MG capsule Take 1 capsule by mouth  daily      Semaglutide (OZEMPIC, 0.25 OR 0.5 MG/DOSE, SC) Inject 0.5 mg into the skin once a week      NOVOLOG FLEXPEN 100 UNIT/ML injection pen Changed to sliding scale      pravastatin (PRAVACHOL) 20 MG tablet       Insulin Glargine (LANTUS SC) Inject into the skin 10 units daily increase by 1 unit daily until BS reach 150 goal      diltiazem (TIAZAC) 300 MG extended release capsule TAKE 1 CAPSULE BY MOUTH ONE TIME A DAY  3    hyoscyamine (LEVSIN/SL) 125 MCG sublingual tablet DISSOLVE 1 TABLET UNDER TONGUE EVERY FOUR HOURS AS NEEDED  5    CPAP Machine MISC by Does not apply route Please check patient's PAP machine for proper functioning by [employer]. 1 each 0    ONE TOUCH ULTRA TEST strip CHECK BLOOD SUGAR ONCE DAILY.  3    zolpidem (AMBIEN) 10 MG tablet Take 1 tablet by mouth nightly. Takes every night      LORazepam (ATIVAN) 1 MG tablet Take 1 tablet by mouth every 6 hours as needed.         Allergies:    Patient has no known allergies.    Social History:    reports that she has never smoked. She has never used smokeless tobacco. She reports that she does not drink alcohol and does not use drugs.    Family History:       Problem Relation Age of Onset    COPD Mother     Hypertension Mother     Diabetes Mother     High Cholesterol Mother     Depression Mother     Alcohol Abuse Mother     Hypertension Father     Diabetes Father     High Cholesterol Father     Heart Attack Father     Diabetes Other     Hypertension Other     Mental Illness Other     Heart Disease Other      Other Other         bone and joint problems    Breast Cancer Other 70        maternal second cousin breast cancer       Diet:  No diet orders on file    Review of systems:   Pertinent positives as noted in the HPI. All other systems reviewed and negative.    PHYSICAL EXAM:  BP (!) 152/87   Pulse (!) 115   Temp 99.7 F (37.6 C)   Resp 15   Ht 5' 6.5" (1.689 m)   Wt 188 lb (85.3 kg)   SpO2 96%   BMI 29.89 kg/m   General appearance: No apparent distress, appears stated age and cooperative.  HEENT: Normal cephalic, atraumatic without obvious deformity. Pupils equal, round, and reactive to light.  Extra ocular muscles intact. Conjunctivae/corneas clear. Dry oral mucosa.  Diffusely tender on palpation with frontal, back aspect of head and neck.   Neck: Supple, with full range of motion. No jugular venous distention. Trachea midline. Has tenderness on posterior aspect of neck.   Respiratory:  Normal respiratory effort. Clear to auscultation, bilaterally without Rales/Wheezes/Rhonchi.  Cardiovascular: Regular rate and rhythm with normal S1/S2 without murmurs, rubs or gallops.  Abdomen: Soft, non-distended with normal bowel sounds, tender at lower abdominal region. No guarding or rigidity.   Musculoskeletal:  No clubbing, cyanosis or edema bilaterally.  Skin: Skin color,  texture, turgor normal.  No rashes or lesions.  Neurologic:  Neurovascularly intact without any focal sensory/motor deficits. Cranial nerves: II-XII intact, grossly non-focal. Generalized weakness.  Negative Brudzinski and Kernig sign.  Psychiatric: Alert and oriented, thought content appropriate, normal insight      Labs:   Recent Labs     06/25/21  1934   WBC 6.9   HGB 14.7   HCT 43.7   PLT 233     Recent Labs     06/25/21  1934   NA 141   K 3.6   CL 99   CO2 25   BUN 14   CREATININE 0.6   CALCIUM 9.1     Recent Labs     06/25/21  1934   AST 866*   ALT 659*   BILITOT 1.0   ALKPHOS 226*     No results for input(s): INR in the last 72 hours.  No  results for input(s): CKTOTAL, TROPONINI in the last 72 hours.    Urinalysis:    Lab Results   Component Value Date/Time    NITRU NEGATIVE 06/25/2021 10:09 PM    BLOODU NEGATIVE 06/25/2021 10:09 PM    GLUCOSEU NEGATIVE 06/25/2021 10:09 PM       Radiology:   XR CHEST (2 VW)   Final Result   Impression:   No acute cardiopulmonary abnormality.      This document has been electronically signed by: Linus Orn, MD on    06/25/2021 09:04 PM      CT ABDOMEN PELVIS W IV CONTRAST Additional Contrast? None   Final Result   Impression:   1. Liquid stool throughout much of the colon compatible with diarrhea. No    evidence of colitis.      2. Otherwise no acute abnormality in the abdomen or pelvis.      This document has been electronically signed by: Linus Orn, MD on    06/25/2021 09:02 PM      All CTs at this facility use dose modulation techniques and iterative    reconstructions, and/or weight-based dosing   when appropriate to reduce radiation to a low as reasonably achievable.      CT HEAD WO CONTRAST   Final Result   Impression:   No acute intracranial abnormality.      This document has been electronically signed by: Linus Orn, MD on    06/25/2021 08:58 PM      All CTs at this facility use dose modulation techniques and iterative    reconstructions, and/or weight-based dosing   when appropriate to reduce radiation to a low as reasonably achievable.        XR CHEST (2 VW)    Result Date: 06/25/2021  1 view chest x-ray. Comparison: CR/SR - XR CHEST PORTABLE - 11/27/2020 08:06 PM EDT Findings: The lungs appear clear. There is no consolidation, effusion, or pneumothorax. Cardiomediastinal silhouette is within normal limits. There is lower cervical fusion instrumentation.     Impression: No acute cardiopulmonary abnormality. This document has been electronically signed by: Linus Orn, MD on 06/25/2021 09:04 PM    CT HEAD WO CONTRAST    Result Date: 06/25/2021  CT head without contrast Comparison: CT - CT HEAD  WO CONTR - 07/19/2013 09:48 AM EDT Findings: There is mild generalized cerebral atrophy. There is no acute intracranial hemorrhage. Ventricles are of normal size and shape. No mass effect or midline shift is present. The gray-white matter differentiation appears normal. There is a  small mucous retention cyst in the left maxillary sinus. No fractures are identified.     Impression: No acute intracranial abnormality. This document has been electronically signed by: Linus Orn, MD on 06/25/2021 08:58 PM All CTs at this facility use dose modulation techniques and iterative reconstructions, and/or weight-based dosing when appropriate to reduce radiation to a low as reasonably achievable.    CT ABDOMEN PELVIS W IV CONTRAST Additional Contrast? None    Result Date: 06/25/2021  CT abdomen and pelvis with contrast Comparison: CT/SR - CT ABDOMEN PELVIS W IV CONTRAST - 04/26/2018 11:30 PM EDT Findings: The lung bases are clear. There is a small hiatal hernia. Patient is status post cholecystectomy. The liver, pancreas, spleen, and adrenal glands are unremarkable. There are a couple tiny bilateral renal cysts. Kidneys are otherwise unremarkable. The appendix is normal. There is liquid stool throughout much of the colon compatible with diarrhea. There are no CT findings of colitis. Small bowel is unremarkable. Aorta is normal diameter. There are no enlarged lymph nodes. Patient is status post hysterectomy. There are degenerative changes of the lumbar spine. There is no acute fracture or suspicious lytic or sclerotic lesion.     Impression: 1. Liquid stool throughout much of the colon compatible with diarrhea. No evidence of colitis. 2. Otherwise no acute abnormality in the abdomen or pelvis. This document has been electronically signed by: Linus Orn, MD on 06/25/2021 09:02 PM All CTs at this facility use dose modulation techniques and iterative reconstructions, and/or weight-based dosing when appropriate to reduce  radiation to a low as reasonably achievable.        EKG: Normal sinus rhythm.    Electronically signed by Loman Chroman, MD on 06/25/2021 at 11:50 PM

## 2021-06-25 NOTE — ED Notes (Signed)
Report given to Zach, RN at this time.     Bridney Guadarrama, RN  06/25/21 2259

## 2021-06-25 NOTE — ED Notes (Signed)
Pt and vs reassessed. RR easy and unlabored. Pt resting in bed alert. RN informed pt that Dr. Evie Lacks states she will assess pt soon. Pt stable at this time     Herbie Saxon, [place]  06/25/21 2007

## 2021-06-25 NOTE — ED Notes (Signed)
ED to inpatient nurses report    Chief Complaint   Patient presents with    Emesis    Diarrhea    Dizziness    Nausea    Generalized Body Aches      Present to ED from home  LOC: alert and orientated to name, place, date  Vital signs   Vitals:    06/25/21 2001 06/25/21 2116 06/25/21 2219 06/25/21 2303   BP: 136/85 (!) 152/106 (!) 147/82 (!) 152/87   Pulse: (!) 125 (!) 119 (!) 107 (!) 115   Resp: 13 19 22 15    Temp:       SpO2: 96% 95% 95% 96%   Weight:       Height:          Oxygen Baseline 0 L    Current needs required 0 L   LDAs:   Peripheral IV 06/25/21 Posterior;Right Hand (Active)   Site Assessment Clean, dry & intact 06/25/21 2219   Line Status Blood return noted;Normal saline locked;Flushed 06/25/21 2219   Dressing Status Clean, dry & intact 06/25/21 2219   Dressing Type Securing device;Transparent 06/25/21 2219     Mobility:1 assist  Pending ED orders: none  Present condition: stable      Electronically signed by 2220, RN on 06/25/2021 at 11:58 PM      06/27/2021, RN  06/25/21 [phone]

## 2021-06-25 NOTE — ED Provider Notes (Signed)
[facility]      EMERGENCY MEDICINE     Pt Name: Theresa Vargas  MRN: [id number]  [place] [birth year]  Date of evaluation: 06/25/2021  Provider: Barbarann Kelly Lianne Moris, MD, [place]       Chief Complaint   Patient presents with    Emesis    Diarrhea    Dizziness    Nausea    Generalized Body Aches     HISTORY OF PRESENT ILLNESS   Theresa Vargas is a pleasant 66 y.o. female who presents to the emergency department for evaluation of nausea, vomiting, diarrhea, body aches.    Per the patient, she states that she has been having a headache over the past week with body aches, nausea/vomiting, and diarrhea that began today. She states that she feels she can't get comfortable. Patient denies any history of migraines, no visual changes, no photo/phonophobia. States she does feel dizzy. No feelings of chest pain, says she feels her heart is racing. Did not eat or drink anything unusual over the past day. No fevers/chills. Patient denies any abdominal pain, just some mild cramping. Patient has no other acute complaints at this time.     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 pylori infection 05/03/2018    stool    Hyperlipidemia     Hypertension     Nausea & vomiting     Panic disorder     PONV (postoperative nausea and vomiting)     Psychiatric problem     Sleep apnea     on cpap       Patient Active Problem List   Diagnosis Code    OM (otitis media) H66.90    ETD (eustachian tube dysfunction) H69.80    Dizziness and giddiness R42    Anal skin tag K64.4    Tinnitus of both ears H93.13    Chronic sinusitis J32.9    Acute URI J06.9    Hypertrophy of nasal turbinates J34.3    Nasal septal deviation J34.2    Status post myringotomy with insertion of tube Z96.22    Eustachian tube dysfunction H69.80    Otitis media H66.90    Heart palpitations R00.2    Essential hypertension I10    OSA on CPAP  G47.33, Z99.89    Obesity (BMI 30-39.9) E66.9    Sacroiliac inflammation ([place]) M46.1    Diarrhea R19.7     SURGICAL HISTORY       Past Surgical History:   Procedure Laterality Date    BACK INJECTION Bilateral 07/12/2019    bilateral SI MBB # 1 performed by Laqueta Carina, MD at [facility] Bilateral 12/29/2020    Bilateral sacroiliac medial branch block performed by Laqueta Carina, MD at [facility]  11/28/2015    [place]    CARDIOVASCULAR STRESS TEST  2018    CARDIOVASCULAR STRESS TEST  2016    CHOLECYSTECTOMY  2007    Dr Christean Leaf    COLONOSCOPY      2006-Dr [place]  02/2011    Dr Lincoln Brigham skin tag removal    HYSTERECTOMY (CERVIX STATUS UNKNOWN)  2007    Dr Louis Meckel age 40    NECK  SURGERY  2008 [facility]    ACDF    NERVE BLOCK  10/29/2014    LUMBAR FACET INJECTION    OTHER SURGICAL HISTORY  11/05/2010    hemmoridectomy-Dr Olt    OTHER SURGICAL HISTORY Bilateral 01/06/2015    Lumbar Facet    OTHER SURGICAL HISTORY  09/2010    hemorrhoid banding-Dr Neidich    OTHER SURGICAL HISTORY  09/2010    hemorrhoid banding-Dr Neidich    PR TYMPANOSTOMY GENERAL ANESTHESIA Left 07/19/2016    MYRINGOTOMY TYPANOSTOMY TUBE PLACEMENT, LEFT performed by Eldridge Abrahams, MD at [facility]  2007    left upper arm-benign Dr Kenna Gilbert    TYMPANOSTOMY TUBE PLACEMENT  2006,2011,2016       CURRENT MEDICATIONS       Current Discharge Medication List        CONTINUE these medications which have NOT CHANGED    Details   HYDROcodone-acetaminophen (NORCO) 5-325 MG per tablet Take 1 tablet by mouth every 6 hours as needed for Pain for up to 30 days.  Qty: 120 tablet, Refills: 0    Comments: Reduce doses taken as pain becomes manageable  Associated Diagnoses: Lumbar post-laminectomy syndrome; Spinal stenosis of lumbar region with neurogenic claudication; Spondylosis of lumbar region without myelopathy or radiculopathy; Chronic pain  syndrome      losartan (COZAAR) 100 MG tablet Take 1 tablet by mouth daily      hydroCHLOROthiazide (HYDRODIURIL) 25 MG tablet Take 1 tablet by mouth daily      ibuprofen (ADVIL;MOTRIN) 200 MG tablet Take 1 tablet by mouth every 6 hours as needed for Pain      FLUoxetine (PROZAC) 40 MG capsule Take 1 capsule by mouth daily      Semaglutide (OZEMPIC, 0.25 OR 0.5 MG/DOSE, SC) Inject 0.5 mg into the skin once a week      NOVOLOG FLEXPEN 100 UNIT/ML injection pen Changed to sliding scale      pravastatin (PRAVACHOL) 20 MG tablet       Insulin Glargine (LANTUS SC) Inject into the skin 10 units daily increase by 1 unit daily until BS reach 150 goal      diltiazem (TIAZAC) 300 MG extended release capsule TAKE 1 CAPSULE BY MOUTH ONE TIME A DAY  Refills: 3      hyoscyamine (LEVSIN/SL) 125 MCG sublingual tablet DISSOLVE 1 TABLET UNDER TONGUE EVERY FOUR HOURS AS NEEDED  Refills: 5      CPAP Machine MISC by Does not apply route Please check patient's PAP machine for proper functioning by [employer].  Qty: 1 each, Refills: 0      ONE TOUCH ULTRA TEST strip CHECK BLOOD SUGAR ONCE DAILY.  Refills: 3      zolpidem (AMBIEN) 10 MG tablet Take 1 tablet by mouth nightly. Takes every night      LORazepam (ATIVAN) 1 MG tablet Take 1 tablet by mouth every 6 hours as needed.             ALLERGIES     has No Known Allergies.    FAMILY HISTORY     She indicated that her mother is deceased. She indicated that her father is deceased.       SOCIAL HISTORY       Social History     Tobacco Use    Smoking status: Never    Smokeless tobacco: Never   Vaping Use    Vaping Use: Never used  Substance Use Topics    Alcohol use: No    Drug use: No       PHYSICAL EXAM       ED Triage Vitals   BP Temp Temp Source Pulse Respirations SpO2 Height Weight - Scale   06/25/21 1828 06/25/21 1828 06/26/21 0045 06/25/21 1828 06/25/21 1828 06/25/21 1828 06/25/21 1828 06/25/21 1828   (!) 153/91 99.7 F (37.6 C) Oral (!) 128 16 96 % 5' 6.5" (1.689 m) 188 lb (85.3  kg)       Additional Vital Signs:  Vitals:    06/26/21 0127   BP:    Pulse:    Resp: 20   Temp:    SpO2:      Physical Exam  Constitutional:       Appearance: Normal appearance.   HENT:      Head: Normocephalic and atraumatic.      Nose: Nose normal.      Mouth/Throat:      Mouth: Mucous membranes are dry.   Eyes:      Extraocular Movements: Extraocular movements intact.      Pupils: Pupils are equal, round, and reactive to light.   Cardiovascular:      Rate and Rhythm: Regular rhythm. Tachycardia present.      Pulses: Normal pulses.      Heart sounds: Normal heart sounds.   Pulmonary:      Effort: Pulmonary effort is normal.      Breath sounds: Normal breath sounds.   Abdominal:      General: Abdomen is flat.      Palpations: Abdomen is soft.      Tenderness: There is no abdominal tenderness. There is no guarding or rebound.   Musculoskeletal:         General: Normal range of motion.      Cervical back: Normal range of motion.      Right lower leg: No edema.      Left lower leg: No edema.   Skin:     General: Skin is warm and dry.      Capillary Refill: Capillary refill takes less than 2 seconds.   Neurological:      General: No focal deficit present.      Mental Status: She is alert and oriented to person, place, and time.      Cranial Nerves: No cranial nerve deficit.      Motor: No weakness.   Psychiatric:         Mood and Affect: Mood normal.       FORMAL DIAGNOSTIC RESULTS     RADIOLOGY: Interpretation per the Radiologist below, if available at the time of this note (none if blank):    XR CHEST (2 VW)   Final Result   Impression:   No acute cardiopulmonary abnormality.      This document has been electronically signed by: Linus Orn, MD on    06/25/2021 09:04 PM      CT ABDOMEN PELVIS W IV CONTRAST Additional Contrast? None   Final Result   Impression:   1. Liquid stool throughout much of the colon compatible with diarrhea. No    evidence of colitis.      2. Otherwise no acute abnormality in the abdomen or  pelvis.      This document has been electronically signed by: Linus Orn, MD on    06/25/2021 09:02 PM      All CTs at this facility use dose modulation techniques  and iterative    reconstructions, and/or weight-based dosing   when appropriate to reduce radiation to a low as reasonably achievable.      CT HEAD WO CONTRAST   Final Result   Impression:   No acute intracranial abnormality.      This document has been electronically signed by: Linus Orn, MD on    06/25/2021 08:58 PM      All CTs at this facility use dose modulation techniques and iterative    reconstructions, and/or weight-based dosing   when appropriate to reduce radiation to a low as reasonably achievable.          LABS: (none if blank)  Labs Reviewed   GASTROINTESTINAL PANEL, MOLECULAR - Abnormal; Notable for the following components:       Result Value    Norovirus GI GII PCR Detected (*)     All other components within normal limits   CBC WITH AUTO DIFFERENTIAL - Abnormal; Notable for the following components:    MPV 8.9 (*)     Lymphocytes Absolute 0.4 (*)     Monocytes Absolute 0.3 (*)     All other components within normal limits   COMPREHENSIVE METABOLIC PANEL W/ REFLEX TO MG FOR LOW K - Abnormal; Notable for the following components:    Glucose 169 (*)     AST 866 (*)     Alkaline Phosphatase 226 (*)     ALT 659 (*)     All other components within normal limits   ANION GAP - Abnormal; Notable for the following components:    Anion Gap 17.0 (*)     All other components within normal limits   LACTATE, SEPSIS - Abnormal; Notable for the following components:    Lactic Acid, Sepsis 2.6 (*)     All other components within normal limits   C-REACTIVE PROTEIN - Abnormal; Notable for the following components:    CRP 1.13 (*)     All other components within normal limits   URINALYSIS WITH REFLEX TO CULTURE - Abnormal; Notable for the following components:    Specific Gravity, Urine > 1.030 (*)     All other components within normal limits    BLOOD GAS, VENOUS - Abnormal; Notable for the following components:    PH MIXED 7.46 (*)     PCO2, MIXED VENOUS 37 (*)     PO2, Mixed 57 (*)     All other components within normal limits   POCT GLUCOSE - Abnormal; Notable for the following components:    POC Glucose 178 (*)     All other components within normal limits   POCT GLUCOSE - Normal   C DIFF TOXIN/ANTIGEN   LIPASE   OSMOLALITY   GLOMERULAR FILTRATION RATE, ESTIMATED   LACTATE, SEPSIS   SEDIMENTATION RATE   TROPONIN   BRAIN NATRIURETIC PEPTIDE   BETA-HYDROXYBUTYRATE   HEPATITIS PANEL, ACUTE   HEPATITIS PANEL, ACUTE   POCT GLUCOSE   POCT GLUCOSE       (Any cultures that may have been sent were not resulted at the time of this patient visit)    [place] / ED COURSE:   MDM  /  Given patient's history and clinical presentation, full work up in the ED included: CBC, CMP, lactate, urinalysis, GI stool panel, cdiff test, vbg, betahydroxybutyrate, hepatitis panel, troponin, ekg, crp, esr, lipase, CT abdomen pelvis, CT head without contrast. Patient admitted to hospitalist service for further care given her tachycardia, inability to  tolerate PO, and elevated liver enzymes.  Vitals Reviewed:    Vitals:    06/25/21 2219 06/25/21 2303 06/26/21 0045 06/26/21 0127   BP: (!) 147/82 (!) 152/87 136/85    Pulse: (!) 107 (!) 115 (!) 106    Resp: 22 15 18 20    Temp:   (!) 100.6 F (38.1 C)    TempSrc:   Oral    SpO2: 95% 96% 100%    Weight:       Height:           Differential Diagnosis includes (but not limited to):  Viral gastroenteritis, ACS, c diff, UTI, cholelithiasis, covid, flu, pancreatitis      External Documentation:   Previous patient encounter documents & history available on EMR was reviewed       Pt was reassessed and the results of pertinent diagnostic studies and exam findings were discussed. The patient's provisional diagnosis and plan of care were discussed with the patient and present family utilizing shared decision-making. Any medications  were reviewed and indications and risks of medications were discussed with the patient /family present. Strict verbal and written return precautions, instructions and appropriate follow-up provided to  the patient .                   ED Medications administered this visit:  (None if blank)  Medications   dilTIAZem (CARDIZEM CD) extended release capsule 300 mg (has no administration in time range)   FLUoxetine (PROZAC) capsule 40 mg (has no administration in time range)   hydroCHLOROthiazide (HYDRODIURIL) tablet 25 mg ( Oral Automatically Held 06/29/21 0900)   HYDROcodone-acetaminophen (NORCO) 5-325 MG per tablet 1 tablet (has no administration in time range)   hyoscyamine (OSCIMIN) dispersible tablet 125 mcg (has no administration in time range)   insulin glargine (LANTUS) injection vial 10 Units (has no administration in time range)   insulin lispro (HUMALOG) injection vial 0-4 Units (has no administration in time range)   insulin lispro (HUMALOG) injection vial 0-4 Units (has no administration in time range)   glucose chewable tablet 16 g (has no administration in time range)   dextrose bolus 10% 125 mL (has no administration in time range)     Or   dextrose bolus 10% 250 mL (has no administration in time range)   glucagon (rDNA) injection 1 mg (has no administration in time range)   dextrose 10 % infusion (has no administration in time range)   LORazepam (ATIVAN) tablet 1 mg (has no administration in time range)   losartan (COZAAR) tablet 100 mg (has no administration in time range)   pravastatin (PRAVACHOL) tablet 40 mg ( Oral Automatically Held 06/29/21 2100)   zolpidem (AMBIEN) tablet 10 mg (has no administration in time range)   sodium chloride flush 0.9 % injection 5-40 mL (has no administration in time range)   sodium chloride flush 0.9 % injection 5-40 mL (has no administration in time range)   0.9 % sodium chloride infusion (has no administration in time range)   enoxaparin (LOVENOX) injection 40 mg (has no  administration in time range)   ondansetron (ZOFRAN-ODT) disintegrating tablet 4 mg (has no administration in time range)     Or   ondansetron (ZOFRAN) injection 4 mg (has no administration in time range)   polyethylene glycol (GLYCOLAX) packet 17 g (has no administration in time range)   acetaminophen (TYLENOL) tablet 650 mg (has no administration in time range)     Or   acetaminophen (  TYLENOL) suppository 650 mg (has no administration in time range)   dicyclomine (BENTYL) capsule 20 mg (has no administration in time range)   ketorolac (TORADOL) injection 15 mg (has no administration in time range)   ketorolac (TORADOL) injection 30 mg (has no administration in time range)   0.9 % sodium chloride infusion (has no administration in time range)   0.9 % sodium chloride bolus (0 mLs IntraVENous Stopped 06/25/21 2007)   ondansetron (ZOFRAN) injection 4 mg (4 mg IntraVENous Given 06/25/21 1836)   0.9 % sodium chloride bolus (0 mLs IntraVENous Stopped 06/25/21 2332)   iopamidol (ISOVUE-370) 76 % injection 80 mL (80 mLs IntraVENous Given 06/25/21 2040)   ketorolac (TORADOL) injection 15 mg (15 mg IntraVENous Given 06/25/21 2110)   ondansetron (ZOFRAN) injection 4 mg (4 mg IntraVENous Given 06/25/21 2112)   LORazepam (ATIVAN) tablet 1 mg (1 mg Oral Given 06/25/21 2212)   0.9 % sodium chloride bolus (1,000 mLs IntraVENous New Bag 06/25/21 2333)   ondansetron (ZOFRAN) injection 4 mg (4 mg IntraVENous Given 06/25/21 2330)             DISCHARGE PRESCRIPTIONS: (None if blank)  Current Discharge Medication List          FINAL IMPRESSION      1. Dizziness    2. Nausea vomiting and diarrhea          DISPOSITION/PLAN   DISPOSITION Admitted 06/25/2021 11:42:22 PM      OUTPATIENT FOLLOW UP THE PATIENT:  No follow-up provider specified.    Camaya Gannett Lianne Moris, MD      Camrie Stock Lianne Moris, MD  Resident  06/26/21 [phone]

## 2021-06-25 NOTE — ED Provider Notes (Signed)
[facility]  Urgent Care Encounter       CHIEF COMPLAINT       Chief Complaint   Patient presents with    Emesis    Diarrhea    Dizziness    Nausea    Generalized Body Aches       Nurses Notes reviewed and I agree except as noted in the HPI.  HISTORY OF PRESENT ILLNESS   Theresa Vargas is a 66 y.o. female who presents with complaints of fatigue, chest pain, diarrhea, emesis, nausea, body aches, and dizziness. Patient reports symptoms just started today. Patient on arrival looks ill in appearance with a grey / pale color. Patient is tachycardic on arrival. Patient is a diabetic and states glucose has been in the low 200's today via Dexacom. Patient glucose on arrival from hospital glucometer 178.     The history is provided by the patient.     REVIEW OF SYSTEMS     Review of Systems   Constitutional:  Positive for fatigue. Negative for fever.   Respiratory:  Negative for shortness of breath.    Cardiovascular:  Positive for chest pain (comes and goes).   Gastrointestinal:  Positive for diarrhea, nausea and vomiting.   Musculoskeletal:  Positive for myalgias.   Neurological:  Positive for dizziness.   All other systems reviewed and are negative.    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 pylori infection 05/03/2018    stool    Hyperlipidemia     Hypertension     Nausea & vomiting     Panic disorder     PONV (postoperative nausea and vomiting)     Psychiatric problem     Sleep apnea     on cpap       SURGICALHISTORY     Patient  has a past surgical history that includes Cholecystectomy (2007); Neck surgery (2008 [facility]); Tympanostomy tube placement (2006,2011,2016); skin biopsy (2007); Colonoscopy; Hemorrhoid surgery (02/2011); other surgical history (11/05/2010); Nerve Block (10/29/2014); other surgical history (Bilateral, 01/06/2015); back surgery (11/28/2015); pr tympanostomy general anesthesia (Left,  07/19/2016); other surgical history (09/2010); other surgical history (09/2010); cardiovascular stress test (2018); cardiovascular stress test (2016); Back Injection (Bilateral, 07/12/2019); Hysterectomy (2007); and Back Injection (Bilateral, 12/29/2020).    CURRENT MEDICATIONS       Previous Medications    CPAP MACHINE MISC    by Does not apply route Please check patient's PAP machine for proper functioning by [employer].    DILTIAZEM (TIAZAC) 300 MG EXTENDED RELEASE CAPSULE    TAKE 1 CAPSULE BY MOUTH ONE TIME A DAY    FLUOXETINE (PROZAC) 40 MG CAPSULE    Take 1 capsule by mouth daily    HYDROCHLOROTHIAZIDE (HYDRODIURIL) 25 MG TABLET    Take 1 tablet by mouth daily    HYDROCODONE-ACETAMINOPHEN (NORCO) 5-325 MG PER TABLET    Take 1 tablet by mouth every 6 hours as needed for Pain for up to 30 days.    HYOSCYAMINE (LEVSIN/SL) 125 MCG SUBLINGUAL TABLET    DISSOLVE 1 TABLET UNDER TONGUE EVERY FOUR HOURS AS NEEDED    IBUPROFEN (ADVIL;MOTRIN) 200 MG TABLET    Take 1 tablet by mouth every 6 hours as needed for Pain    INSULIN GLARGINE (LANTUS SC)    Inject into the  skin 10 units daily increase by 1 unit daily until BS reach 150 goal    LORAZEPAM (ATIVAN) 1 MG TABLET    Take 1 tablet by mouth every 6 hours as needed.    LOSARTAN (COZAAR) 100 MG TABLET    Take 1 tablet by mouth daily    NOVOLOG FLEXPEN 100 UNIT/ML INJECTION PEN    Changed to sliding scale    ONE TOUCH ULTRA TEST STRIP    CHECK BLOOD SUGAR ONCE DAILY.    PRAVASTATIN (PRAVACHOL) 20 MG TABLET        SEMAGLUTIDE (OZEMPIC, 0.25 OR 0.5 MG/DOSE, SC)    Inject 0.5 mg into the skin once a week    ZOLPIDEM (AMBIEN) 10 MG TABLET    Take 1 tablet by mouth nightly. Takes every night       ALLERGIES     Patient is has No Known Allergies.    Patients   Immunization History   Administered Date(s) Administered    COVID-19, PFIZER Bivalent, DO NOT Dilute, (age 12y+), IM, 30 mcg/0.3 mL 10/28/2020    Influenza Vaccine, unspecified formulation 11/16/2014    Influenza, FLUARIX,  FLULAVAL, FLUZONE (age 42 mo+) AND AFLURIA, (age 64 y+), PF, 0.455mL 10/09/2020    Influenza, FLUCELVAX, (age 42 mo+), MDCK, PF, 0.685mL 02/14/2020       FAMILY HISTORY     Patient's family history includes Alcohol Abuse in her mother; Breast Cancer (age of onset: 1945) in an other family member; COPD in her mother; Depression in her mother; Diabetes in her father, mother, and another family member; Heart Attack in her father; Heart Disease in an other family member; High Cholesterol in her father and mother; Hypertension in her father, mother, and another family member; Mental Illness in an other family member; Other in an other family member.    SOCIAL HISTORY     Patient  reports that she has never smoked. She has never used smokeless tobacco. She reports that she does not drink alcohol and does not use drugs.    PHYSICAL EXAM     ED TRIAGE VITALS  BP: (!) 153/91, Temp: 99.7 F (37.6 C), Pulse: (!) 128, Respirations: 16, SpO2: 96 %,Estimated body mass index is 29.89 kg/m as calculated from the following:    Height as of this encounter: 5' 6.5" (1.689 m).    Weight as of this encounter: 188 lb (85.3 kg).,No LMP recorded. Patient has had a hysterectomy.    Physical Exam  Vitals and nursing note reviewed.   Constitutional:       Appearance: She is obese. She is toxic-appearing.   HENT:      Head: Normocephalic.   Cardiovascular:      Rate and Rhythm: Tachycardia present.      Pulses: Normal pulses.      Heart sounds: Normal heart sounds.   Pulmonary:      Effort: Pulmonary effort is normal.      Breath sounds: Normal breath sounds.   Skin:     General: Skin is warm and dry.      Coloration: Skin is pale.   Neurological:      Mental Status: She is alert.       DIAGNOSTIC RESULTS     Labs:No results found for this visit on 06/25/21.    IMAGING:    No orders to display         EKG:    Accelerated Junctional Rhythm    Ventricular rate 128  QRS  duration 74 ms  QT/Qtc 406/592 ms  P-R-T -15 42    URGENT CARE COURSE:     Vitals:     06/25/21 1828   BP: (!) 153/91   Pulse: (!) 128   Resp: 16   Temp: 99.7 F (37.6 C)   SpO2: 96%   Weight: 188 lb (85.3 kg)   Height: 5' 6.5" (1.689 m)       Medications   0.9 % sodium chloride bolus (has no administration in time range)   ondansetron (ZOFRAN) injection 4 mg (4 mg IntraVENous Given 06/25/21 1836)            PROCEDURES:  None    FINAL IMPRESSION      1. Dizziness    2. Nausea vomiting and diarrhea          DISPOSITION/ PLAN   Patient to be transferred via ems to [facility] emergency department.         PATIENT REFERRED TO:  Fayrene Fearing T. Bowlus, MD  [address] / [place] [address]      DISCHARGE MEDICATIONS:  New Prescriptions    No medications on file       Discontinued Medications    No medications on file       Current Discharge Medication List          Lillard Anes, APRN - CNP    (Please note that portions of this note were completed with a voice recognition program. Efforts were made to edit the dictations but occasionally words are mis-transcribed.)            Lillard Anes, APRN - CNP  06/25/21 1843

## 2021-06-25 NOTE — ED Notes (Signed)
Report received from [place], Therapist, sports. Patient resting in bed with husband at bedside. No needs stated at this time. Side rails up x2. Call light within reach.      Kathyrn Lass, RN  06/25/21 [phone]

## 2021-06-25 NOTE — ED Notes (Signed)
Patient in bed with family at the bedside. Patient provided education regarding current situation with pt plan of care. Patient VSS and patient does not appear to be in any current distress at this time. Will continue to monitor. Call light within reach.      Sherilyn Banker, RN  06/25/21 2116

## 2021-06-25 NOTE — ED Notes (Signed)
RN to room to medicate pt and obtain EKG- pt moving off floor via rad tech in stable condition at this time     Herbie Saxon, RN  06/25/21 2034

## 2021-06-26 LAB — ANION GAP
Anion Gap: 11 meq/L (ref 8.0–16.0)
Anion Gap: 17 meq/L — ABNORMAL HIGH (ref 8.0–16.0)

## 2021-06-26 LAB — URINALYSIS WITH REFLEX TO CULTURE
Bilirubin Urine: NEGATIVE
Blood, Urine: NEGATIVE
Glucose, Ur: NEGATIVE mg/dl
Ketones, Urine: NEGATIVE
Leukocyte Esterase, Urine: NEGATIVE
Nitrite, Urine: NEGATIVE
Protein, UA: NEGATIVE
Specific Gravity, Urine: >1.03 — AB (ref 1.002–1.030)
Urobilinogen, Urine: 0.2 eu/dl (ref 0.0–1.0)
pH, UA: 7 (ref 5.0–9.0)

## 2021-06-26 LAB — CBC WITH AUTO DIFFERENTIAL
Basophils Absolute: 0 10*3/uL (ref 0.0–0.1)
Basophils: 0.2 %
Eosinophils Absolute: 0 10*3/uL (ref 0.0–0.4)
Eosinophils: 0.4 %
Hematocrit: 34.8 % — ABNORMAL LOW (ref 37.0–47.0)
Hemoglobin: 11.3 gm/dl — ABNORMAL LOW (ref 12.0–16.0)
Immature Grans (Abs): 0.01 10*3/uL (ref 0.00–0.07)
Immature Granulocytes: 0.2 %
Lymphocytes Absolute: 0.6 10*3/uL — ABNORMAL LOW (ref 1.0–4.8)
Lymphocytes: 12.3 %
MCH: 29.7 pg (ref 26.0–33.0)
MCHC: 32.5 gm/dl (ref 32.2–35.5)
MCV: 91.6 fL (ref 81.0–99.0)
MPV: 9.2 fL — ABNORMAL LOW (ref 9.4–12.4)
Monocytes Absolute: 0.2 10*3/uL — ABNORMAL LOW (ref 0.4–1.3)
Monocytes: 4.1 %
Platelets: 241 10*3/uL (ref 130–400)
RBC: 3.8 10*6/uL — ABNORMAL LOW (ref 4.20–5.40)
RDW-CV: 12.5 % (ref 11.5–14.5)
RDW-SD: 41.6 fL (ref 35.0–45.0)
Seg Neutrophils: 82.8 %
Segs Absolute: 3.8 10*3/uL (ref 1.8–7.7)
WBC: 4.6 10*3/uL — ABNORMAL LOW (ref 4.8–10.8)
nRBC: 0 /100 wbc

## 2021-06-26 LAB — GASTROINTESTINAL PANEL, MOLECULAR
Adenovirus F 40 41 PCR: NOT DETECTED
Astrovirus PCR: NOT DETECTED
Campylobacter PCR: NOT DETECTED
Cryptosporidium PCR: NOT DETECTED
Cyclospora Cayetanensis PCR: NOT DETECTED
E Coli Enteroaggregative PCR: NOT DETECTED
E Coli Enteropathogenic PCR: NOT DETECTED
E Coli Enterotoxigenic PCR: NOT DETECTED
E Coli Shiga Like Toxin PCR: NOT DETECTED
E Coli Shigella/Enteroinvasive PCR: NOT DETECTED
E HISTOLYTICA GI FILM ARRAY: NOT DETECTED
Giardia Lamblia PCR: NOT DETECTED
Norovirus GI GII PCR: DETECTED — AB
Plesiomonas Shigelloides PCR: NOT DETECTED
Rotavirus A PCR: NOT DETECTED
Salmonella PCR: NOT DETECTED
Sapovirus PCR: NOT DETECTED
Vibrio Cholerae PCR: NOT DETECTED
Vibrio PCR: NOT DETECTED
Yersinia Enterocolitica PCR: NOT DETECTED

## 2021-06-26 LAB — HEPATITIS PANEL, ACUTE
Hep A IgM: NEGATIVE
Hep B Core Ab, IgM: NEGATIVE
Hepatitis B Surface Ag: NEGATIVE
Hepatitis C Ab: NEGATIVE

## 2021-06-26 LAB — COMPREHENSIVE METABOLIC PANEL W/ REFLEX TO MG FOR LOW K
ALT: 659 U/L — ABNORMAL HIGH (ref 11–66)
AST: 866 U/L — ABNORMAL HIGH (ref 5–40)
Albumin: 4.7 g/dL (ref 3.5–5.1)
Alkaline Phosphatase: 226 U/L — ABNORMAL HIGH (ref 38–126)
BUN: 14 mg/dL (ref 7–22)
CO2: 25 meq/L (ref 23–33)
Calcium: 9.1 mg/dL (ref 8.5–10.5)
Chloride: 99 meq/L (ref 98–111)
Creatinine: 0.6 mg/dL (ref 0.4–1.2)
Glucose: 169 mg/dL — ABNORMAL HIGH (ref 70–108)
Potassium reflex Magnesium: 3.6 meq/L (ref 3.5–5.2)
Sodium: 141 meq/L (ref 135–145)
Total Bilirubin: 1 mg/dL (ref 0.3–1.2)
Total Protein: 7.1 g/dL (ref 6.1–8.0)

## 2021-06-26 LAB — LACTATE, SEPSIS
Lactic Acid, Sepsis: 2.6 mmol/L — ABNORMAL HIGH (ref 0.5–1.9)
Lactic Acid, Sepsis: 1.8 mmol/L (ref 0.5–1.9)

## 2021-06-26 LAB — C DIFF TOXIN/ANTIGEN: C.diff Toxin/Antigen: NEGATIVE

## 2021-06-26 LAB — GLOMERULAR FILTRATION RATE, ESTIMATED
Est, Glom Filt Rate: >60 mL/min/{1.73_m2} (ref >60)
Est, Glom Filt Rate: >60 mL/min/{1.73_m2} (ref >60)

## 2021-06-26 LAB — HEPATIC FUNCTION PANEL
ALT: 407 U/L — ABNORMAL HIGH (ref 11–66)
AST: 313 U/L — ABNORMAL HIGH (ref 5–40)
Albumin: 3.7 g/dL (ref 3.5–5.1)
Alkaline Phosphatase: 160 U/L — ABNORMAL HIGH (ref 38–126)
Bilirubin, Direct: 0.3 mg/dL (ref 0.0–0.3)
Total Bilirubin: 0.6 mg/dL (ref 0.3–1.2)
Total Protein: 5.4 g/dL — ABNORMAL LOW (ref 6.1–8.0)

## 2021-06-26 LAB — POCT GLUCOSE: POC Glucose: 114 mg/dl — ABNORMAL HIGH (ref 70–108)

## 2021-06-26 LAB — BASIC METABOLIC PANEL
BUN: 14 mg/dL (ref 7–22)
CO2: 24 meq/L (ref 23–33)
Calcium: 8.3 mg/dL — ABNORMAL LOW (ref 8.5–10.5)
Chloride: 107 meq/L (ref 98–111)
Creatinine: 0.7 mg/dL (ref 0.4–1.2)
Glucose: 134 mg/dL — ABNORMAL HIGH (ref 70–108)
Potassium: 3.3 meq/L — ABNORMAL LOW (ref 3.5–5.2)
Sodium: 142 meq/L (ref 135–145)

## 2021-06-26 LAB — TROPONIN: Troponin T: <0.01 ng/ml

## 2021-06-26 LAB — PROTIME-INR: INR: 1.01 (ref 0.85–1.13)

## 2021-06-26 LAB — BLOOD GAS, VENOUS
Base Exc, Mixed: 2.6 mmol/l (ref <=3.0)
COLLECTED BY:: 223791
HCO3, Mixed: 26 mmol/l (ref 23–28)
O2 Sat, Mixed: 91 %
PCO2, MIXED VENOUS: 37 mmhg — ABNORMAL LOW (ref 41–51)
PH MIXED: 7.46 — ABNORMAL HIGH (ref 7.31–7.41)
PO2, Mixed: 57 mmhg — ABNORMAL HIGH (ref 25–40)

## 2021-06-26 LAB — C-REACTIVE PROTEIN: CRP: 1.13 mg/dl — ABNORMAL HIGH (ref 0.00–1.00)

## 2021-06-26 LAB — BETA-HYDROXYBUTYRATE: Beta-Hydroxybutyrate: 1.77 mg/dl (ref 0.20–2.81)

## 2021-06-26 LAB — SEDIMENTATION RATE: Sed Rate: 8 mm/hr (ref 0–20)

## 2021-06-26 LAB — BRAIN NATRIURETIC PEPTIDE: Pro-BNP: <36 pg/mL (ref 0.0–124.0)

## 2021-06-26 LAB — OSMOLALITY: Osmolality Calc: 285.6 mOsmol/kg (ref 275.0–300.0)

## 2021-06-26 LAB — ETHANOL: ETHYL ALCOHOL, SERUM: <0.01 %

## 2021-06-26 LAB — LIPASE: Lipase: 17.5 U/L (ref 5.6–51.3)

## 2021-06-26 LAB — ACETAMINOPHEN LEVEL: Acetaminophen Level: <5 ug/mL (ref 0.0–20.0)

## 2021-06-26 MED ORDER — LORAZEPAM 1 MG PO TABS
1 MG | Freq: Once | ORAL | Status: AC
Start: 2021-06-26 — End: 2021-06-25
  Administered 2021-06-26: 02:00:00 1 mg via ORAL

## 2021-06-26 MED ORDER — SODIUM CHLORIDE 0.9 % IV SOLN
0.9 % | INTRAVENOUS | Status: DC | PRN
Start: 2021-06-26 — End: 2021-06-26

## 2021-06-26 MED ORDER — HYDROCHLOROTHIAZIDE 25 MG PO TABS
25 MG | Freq: Every day | ORAL | Status: DC
Start: 2021-06-26 — End: 2021-06-26

## 2021-06-26 MED ORDER — KETOROLAC TROMETHAMINE 30 MG/ML IJ SOLN
30 MG/ML | Freq: Once | INTRAMUSCULAR | Status: DC
Start: 2021-06-26 — End: 2021-06-26

## 2021-06-26 MED ORDER — KETOROLAC TROMETHAMINE 30 MG/ML IJ SOLN
30 MG/ML | Freq: Once | INTRAMUSCULAR | Status: AC
Start: 2021-06-26 — End: 2021-06-25
  Administered 2021-06-26: 01:00:00 15 mg via INTRAVENOUS

## 2021-06-26 MED ORDER — SODIUM CHLORIDE 0.9 % IV SOLN
0.9 % | INTRAVENOUS | Status: AC
Start: 2021-06-26 — End: 2021-06-26
  Administered 2021-06-26: 07:00:00 via INTRAVENOUS

## 2021-06-26 MED ORDER — ONDANSETRON HCL 4 MG/2ML IJ SOLN
4 MG/2ML | Freq: Once | INTRAMUSCULAR | Status: AC
Start: 2021-06-26 — End: 2021-06-25
  Administered 2021-06-26: 01:00:00 4 mg via INTRAVENOUS

## 2021-06-26 MED ORDER — KETOROLAC TROMETHAMINE 30 MG/ML IJ SOLN
30 MG/ML | Freq: Four times a day (QID) | INTRAMUSCULAR | Status: DC | PRN
Start: 2021-06-26 — End: 2021-06-26

## 2021-06-26 MED ORDER — DEXTROSE 10 % IV SOLN
10 % | INTRAVENOUS | Status: DC | PRN
Start: 2021-06-26 — End: 2021-06-26

## 2021-06-26 MED ORDER — POLYETHYLENE GLYCOL 3350 17 G PO PACK
17 g | Freq: Every day | ORAL | Status: DC | PRN
Start: 2021-06-26 — End: 2021-06-26

## 2021-06-26 MED ORDER — ACETAMINOPHEN 650 MG RE SUPP
650 | Freq: Four times a day (QID) | RECTAL | Status: DC | PRN
Start: 2021-06-26 — End: 2021-06-26

## 2021-06-26 MED ORDER — IBUPROFEN 200 MG PO TABS
200 | Freq: Four times a day (QID) | ORAL | Status: DC | PRN
Start: 2021-06-26 — End: 2021-06-26

## 2021-06-26 MED ORDER — GLUCOSE 4 G PO CHEW
4 g | ORAL | Status: DC | PRN
Start: 2021-06-26 — End: 2021-06-26

## 2021-06-26 MED ORDER — ONDANSETRON 4 MG PO TBDP
4 MG | Freq: Three times a day (TID) | ORAL | Status: DC | PRN
Start: 2021-06-26 — End: 2021-06-26

## 2021-06-26 MED ORDER — PRAVASTATIN SODIUM 40 MG PO TABS
40 MG | Freq: Every evening | ORAL | Status: DC
Start: 2021-06-26 — End: 2021-06-26

## 2021-06-26 MED ORDER — LORAZEPAM 1 MG PO TABS
1 MG | Freq: Every day | ORAL | Status: DC | PRN
Start: 2021-06-26 — End: 2021-06-26

## 2021-06-26 MED ORDER — GLUCAGON HCL RDNA (DIAGNOSTIC) 1 MG IJ SOLR
1 MG | INTRAMUSCULAR | Status: DC | PRN
Start: 2021-06-26 — End: 2021-06-26

## 2021-06-26 MED ORDER — SODIUM CHLORIDE 0.9 % IV BOLUS
0.9 % | Freq: Once | INTRAVENOUS | Status: AC
Start: 2021-06-26 — End: 2021-06-26
  Administered 2021-06-26: 04:00:00 1000 mL via INTRAVENOUS

## 2021-06-26 MED ORDER — ZOLPIDEM TARTRATE 5 MG PO TABS
5 MG | Freq: Once | ORAL | Status: AC
Start: 2021-06-26 — End: 2021-06-26
  Administered 2021-06-26: 07:00:00 10 mg via ORAL

## 2021-06-26 MED ORDER — INSULIN LISPRO 100 UNIT/ML IJ SOLN
100 UNIT/ML | Freq: Every evening | INTRAMUSCULAR | Status: DC
Start: 2021-06-26 — End: 2021-06-26

## 2021-06-26 MED ORDER — DICYCLOMINE HCL 10 MG PO CAPS
10 MG | Freq: Four times a day (QID) | ORAL | Status: DC
Start: 2021-06-26 — End: 2021-06-26
  Administered 2021-06-26 (×2): 20 mg via ORAL

## 2021-06-26 MED ORDER — FLUOXETINE HCL 20 MG PO CAPS
20 MG | Freq: Every day | ORAL | Status: DC
Start: 2021-06-26 — End: 2021-06-26
  Administered 2021-06-26: 13:00:00 40 mg via ORAL

## 2021-06-26 MED ORDER — HYOSCYAMINE SULFATE 0.125 MG PO TBDP
125 MCG | Freq: Four times a day (QID) | ORAL | Status: DC | PRN
Start: 2021-06-26 — End: 2021-06-26

## 2021-06-26 MED ORDER — ONDANSETRON HCL 4 MG/2ML IJ SOLN
4 MG/2ML | Freq: Once | INTRAMUSCULAR | Status: AC
Start: 2021-06-26 — End: 2021-06-25
  Administered 2021-06-26: 04:00:00 4 mg via INTRAVENOUS

## 2021-06-26 MED ORDER — HYDROCODONE-ACETAMINOPHEN 5-325 MG PO TABS
5-325 MG | Freq: Four times a day (QID) | ORAL | Status: DC | PRN
Start: 2021-06-26 — End: 2021-06-26
  Administered 2021-06-26 (×2): 1 via ORAL

## 2021-06-26 MED ORDER — ACETAMINOPHEN 325 MG PO TABS
325 | Freq: Four times a day (QID) | ORAL | Status: DC | PRN
Start: 2021-06-26 — End: 2021-06-26

## 2021-06-26 MED ORDER — DEXTROSE 10 % IV BOLUS
INTRAVENOUS | Status: DC | PRN
Start: 2021-06-26 — End: 2021-06-26

## 2021-06-26 MED ORDER — KETOROLAC TROMETHAMINE 30 MG/ML IJ SOLN
30 MG/ML | Freq: Once | INTRAMUSCULAR | Status: DC
Start: 2021-06-26 — End: 2021-06-25

## 2021-06-26 MED ORDER — IBUPROFEN 200 MG PO TABS
200 MG | Freq: Once | ORAL | Status: DC
Start: 2021-06-26 — End: 2021-06-26

## 2021-06-26 MED ORDER — GUAIFENESIN-DM 100-10 MG/5ML PO SYRP
100-10 MG/5ML | ORAL | Status: DC | PRN
Start: 2021-06-26 — End: 2021-06-26

## 2021-06-26 MED ORDER — KETOROLAC TROMETHAMINE 30 MG/ML IJ SOLN
30 MG/ML | Freq: Once | INTRAMUSCULAR | Status: DC
Start: 2021-06-26 — End: 2021-06-26
  Administered 2021-06-26: 05:00:00 30 mg via INTRAVENOUS

## 2021-06-26 MED ORDER — NORMAL SALINE FLUSH 0.9 % IV SOLN
0.9 % | INTRAVENOUS | Status: DC | PRN
Start: 2021-06-26 — End: 2021-06-26

## 2021-06-26 MED ORDER — IOPAMIDOL 76 % IV SOLN
76 % | Freq: Once | INTRAVENOUS | Status: AC | PRN
Start: 2021-06-26 — End: 2021-06-25
  Administered 2021-06-26: 01:00:00 80 mL via INTRAVENOUS

## 2021-06-26 MED ORDER — INSULIN LISPRO 100 UNIT/ML IJ SOLN
100 UNIT/ML | Freq: Three times a day (TID) | INTRAMUSCULAR | Status: DC
Start: 2021-06-26 — End: 2021-06-26

## 2021-06-26 MED ORDER — ENOXAPARIN SODIUM 40 MG/0.4ML IJ SOSY
40 MG/0.4ML | Freq: Every day | INTRAMUSCULAR | Status: DC
Start: 2021-06-26 — End: 2021-06-26

## 2021-06-26 MED ORDER — LORAZEPAM 1 MG PO TABS
1 MG | Freq: Four times a day (QID) | ORAL | Status: DC | PRN
Start: 2021-06-26 — End: 2021-06-26

## 2021-06-26 MED ORDER — INSULIN GLARGINE 100 UNIT/ML SC SOLN
100 UNIT/ML | Freq: Every morning | SUBCUTANEOUS | Status: DC
Start: 2021-06-26 — End: 2021-06-26

## 2021-06-26 MED ORDER — SODIUM CHLORIDE 0.9 % IV BOLUS
0.9 % | Freq: Once | INTRAVENOUS | Status: AC
Start: 2021-06-26 — End: 2021-06-25
  Administered 2021-06-26: 01:00:00 1000 mL via INTRAVENOUS

## 2021-06-26 MED ORDER — LOSARTAN POTASSIUM 100 MG PO TABS
100 MG | Freq: Every day | ORAL | Status: DC
Start: 2021-06-26 — End: 2021-06-26
  Administered 2021-06-26: 13:00:00 100 mg via ORAL

## 2021-06-26 MED ORDER — DILTIAZEM HCL ER COATED BEADS 300 MG PO CP24
300 MG | Freq: Every day | ORAL | Status: DC
Start: 2021-06-26 — End: 2021-06-26
  Administered 2021-06-26: 13:00:00 300 mg via ORAL

## 2021-06-26 MED ORDER — ONDANSETRON HCL 4 MG/2ML IJ SOLN
4 MG/2ML | Freq: Four times a day (QID) | INTRAMUSCULAR | Status: DC | PRN
Start: 2021-06-26 — End: 2021-06-26

## 2021-06-26 MED ORDER — NORMAL SALINE FLUSH 0.9 % IV SOLN
0.9 % | Freq: Two times a day (BID) | INTRAVENOUS | Status: DC
Start: 2021-06-26 — End: 2021-06-26

## 2021-06-26 MED ORDER — ZOLPIDEM TARTRATE 5 MG PO TABS
5 MG | Freq: Every evening | ORAL | Status: DC
Start: 2021-06-26 — End: 2021-06-26

## 2021-06-26 MED ORDER — TRIMETHOBENZAMIDE HCL 100 MG/ML IM SOLN
100 MG/ML | Freq: Four times a day (QID) | INTRAMUSCULAR | Status: DC | PRN
Start: 2021-06-26 — End: 2021-06-26

## 2021-06-26 MED FILL — KETOROLAC TROMETHAMINE 30 MG/ML IJ SOLN: 30 MG/ML | INTRAMUSCULAR | Qty: 1

## 2021-06-26 MED FILL — DILTIAZEM HCL ER COATED BEADS 300 MG PO CP24: 300 MG | ORAL | Qty: 1

## 2021-06-26 MED FILL — DICYCLOMINE HCL 10 MG PO CAPS: 10 MG | ORAL | Qty: 2

## 2021-06-26 MED FILL — ONDANSETRON HCL 4 MG/2ML IJ SOLN: 4 MG/2ML | INTRAMUSCULAR | Qty: 2

## 2021-06-26 MED FILL — HYDROCODONE-ACETAMINOPHEN 5-325 MG PO TABS: 5-325 MG | ORAL | Qty: 1

## 2021-06-26 MED FILL — ENOXAPARIN SODIUM 40 MG/0.4ML IJ SOSY: 40 MG/0.4ML | INTRAMUSCULAR | Qty: 0.4

## 2021-06-26 MED FILL — LORAZEPAM 1 MG PO TABS: 1 MG | ORAL | Qty: 1

## 2021-06-26 MED FILL — LOSARTAN POTASSIUM 100 MG PO TABS: 100 MG | ORAL | Qty: 1

## 2021-06-26 MED FILL — ZOLPIDEM TARTRATE 5 MG PO TABS: 5 MG | ORAL | Qty: 2

## 2021-06-26 MED FILL — IBUPROFEN 200 MG PO TABS: 200 MG | ORAL | Qty: 2

## 2021-06-26 MED FILL — LANTUS 100 UNIT/ML SC SOLN: 100 UNIT/ML | SUBCUTANEOUS | Qty: 10

## 2021-06-26 MED FILL — FLUOXETINE HCL 20 MG PO CAPS: 20 MG | ORAL | Qty: 2

## 2021-06-26 MED FILL — PRAVASTATIN SODIUM 40 MG PO TABS: 40 MG | ORAL | Qty: 1

## 2021-06-26 NOTE — Discharge Summary (Signed)
Hospital Medicine Discharge Summary      Patient Identification:   Theresa Vargas   DOB: [birth year]  MRN: [id number]   Account: [id number]      Patient's PCP: Florene Route. Bowlus, MD    Admit Date: 06/25/2021     Discharge Date:   5.26.2023    Admitting Physician: Loman Chroman, MD     Discharge Physician: Quentin Cornwall, DO     Discharge Diagnoses:    Active Hospital Problems    Diagnosis Date Noted    Diarrhea [R19.7] 06/25/2021       The patient was seen and examined on day of discharge and this discharge summary is in conjunction with any daily progress note from day of discharge.    Hospital Course:   Theresa Vargas is a 66 y.o. female admitted to [facility] on 06/25/2021 for n/v/d.        Severe Sepsis/SIRS+ in setting of norovirus infection: has abdominal pain, nausea, vomiting, myalgia and diarrhea. SIRS 2/4 criteria met (temp of 100.6, HR up to 128, WBC of 6.9 with lymphocyte of 0.4 and monocyte of 0.3 suggestive of viral infection), lactic acid of 2.6. .  C. difficile negative.  GI panel positive for norovirus.  CT abd/pelvis showed liquid stool throughout much of the colon compatible with diarrhea. Received 30 cc/kg fluid resuscitation.  Supportive care with IV fluids and Bentyl.  Follow-up blood cultures. Will monitor orthostatic VS as patient reported being significantly lightheaded.   Headache: likely tension/sinus congestion headache as reproducible on physical exam.  Has neck pain which is reproducible but no photophobia or phonophobia.  Negative Kernig and Brudzinski sign. Will treat with toradol PRN for now, can be de-escalated once improves. CT head negative. If headache persists, consider further workup (MRI/LP). On robitussin DM PRN.   Transaminitis: AST of 866, alk phos of 226 and ALT of 659 with normal bilirubin of 1.0.  R factor of 8.7, suggestive of hepatocellular injury. Negative hepatitis panel. Likely shock liver, in setting of ongoing nausea/vomiting and diarrhea. Holding  home statin and acetaminophen. Will check acetaminophen/ETOH level. Denies ETOH use. Consider abdominal ultrasound in am, if transaminitis does not improve with IVF. Will use toradol with caution, low threshold to discontinue if liver function worsens. resolving  HAGMA: due to above. On IVF. Will monitor.  HTN: Continue home diltiazem and losartan.  Holding home hydrochlorothiazide due to dehydration.  Depression with anxiety: Continue home fluoxetine with caution.  Initial QTc was 592 but it improved to 462 on repeat. Continue home ativan PRN.   Diabetes mellitus type 2: Continue home Lantus 10 units every morning with low-dose sliding scale insulin.  Insomnia: Continue home Ambien nightly  OSA: continue home CPAP  History of IBS: continue home hyoscyamine PRN.   HLD: holding home statin due to above.   Debility: PT/OT    5.26.2023 seen this a.m. symptoms completely labs are markedly improving have resolved patient is medically stable for discharge she will resume her home medications as previously prescribed she is to push fluids.    Exam:     Vitals:  Vitals:    06/25/21 2303 06/26/21 0045 06/26/21 0127 06/26/21 0914   BP: (!) 152/87 136/85  134/68   Pulse: (!) 115 (!) 106  96   Resp: 15 18 20 18    Temp:  (!) 100.6 F (38.1 C)  99.3 F (37.4 C)   TempSrc:  Oral  Oral   SpO2: 96% 100%  97%  Weight:       Height:         Weight: Weight - Scale: 188 lb (85.3 kg)     24 hour intake/output:No intake or output data in the 24 hours ending 06/26/21 1039      General appearance:  No apparent distress, appears stated age and cooperative.  HEENT:  Normal cephalic, atraumatic without obvious deformity. Pupils equal, round, and reactive to light.  Extra ocular muscles intact. Conjunctivae/corneas clear.  Neck: Supple, with full range of motion. No jugular venous distention. Trachea midline.  Respiratory:  Normal respiratory effort. Clear to auscultation, bilaterally without Rales/Wheezes/Rhonchi.  Cardiovascular:  Regular  rate and rhythm with normal S1/S2 without murmurs, rubs or gallops.  Abdomen: Soft, non-tender, non-distended with normal bowel sounds.  Musculoskeletal:  No clubbing, cyanosis or edema bilaterally.  Full range of motion without deformity.  Skin: Skin color, texture, turgor normal.  No rashes or lesions.  Neurologic:  Neurovascularly intact without any focal sensory/motor deficits. Cranial nerves: II-XII intact, grossly non-focal.  Psychiatric:  Alert and oriented, thought content appropriate, normal insight  Capillary Refill: Brisk,< 3 seconds   Peripheral Pulses: +2 palpable, equal bilaterally       Labs: For convenience and continuity at follow-up the following most recent labs are provided:      CBC:    Lab Results   Component Value Date/Time    WBC 4.6 06/26/2021 06:12 AM    HGB 11.3 06/26/2021 06:12 AM    HCT 34.8 06/26/2021 06:12 AM    PLT 241 06/26/2021 06:12 AM       Renal:    Lab Results   Component Value Date/Time    NA 142 06/26/2021 04:50 AM    K 3.3 06/26/2021 04:50 AM    K 3.6 06/25/2021 07:34 PM    CL 107 06/26/2021 04:50 AM    CO2 24 06/26/2021 04:50 AM    BUN 14 06/26/2021 04:50 AM    CREATININE 0.7 06/26/2021 04:50 AM    CALCIUM 8.3 06/26/2021 04:50 AM         Significant Diagnostic Studies    Radiology:   XR CHEST (2 VW)   Final Result   Impression:   No acute cardiopulmonary abnormality.      This document has been electronically signed by: Linus Orn, MD on    06/25/2021 09:04 PM      CT ABDOMEN PELVIS W IV CONTRAST Additional Contrast? None   Final Result   Impression:   1. Liquid stool throughout much of the colon compatible with diarrhea. No    evidence of colitis.      2. Otherwise no acute abnormality in the abdomen or pelvis.      This document has been electronically signed by: Linus Orn, MD on    06/25/2021 09:02 PM      All CTs at this facility use dose modulation techniques and iterative    reconstructions, and/or weight-based dosing   when appropriate to reduce radiation to  a low as reasonably achievable.      CT HEAD WO CONTRAST   Final Result   Impression:   No acute intracranial abnormality.      This document has been electronically signed by: Linus Orn, MD on    06/25/2021 08:58 PM      All CTs at this facility use dose modulation techniques and iterative    reconstructions, and/or weight-based dosing   when appropriate to reduce radiation to a low as reasonably  achievable.             Consults:     None    Disposition:    [x]  Home       []  TCU       []  Rehab       []  Psych       []  SNF       []  [place]       []  Other-    Condition at Discharge: Stable    Code Status:  Full Code     Patient Instructions:    Discharge lab work:   Activity: activity as tolerated  Diet: ADULT DIET; Regular      Follow-up visits:   Karren Burly, MD  [place] [id number]  [phone]    Follow up on 07/06/2021  Follow up appointment July 06, 2021 at 2pm.         Discharge Medications:        Medication List        CONTINUE taking these medications      CPAP Machine Misc  by Does not apply route Please check patient's PAP machine for proper functioning by [employer].     dilTIAZem 300 MG extended release capsule  Commonly known as: TIAZAC     FLUoxetine 40 MG capsule  Commonly known as: PROzac     hydroCHLOROthiazide 25 MG tablet  Commonly known as: HYDRODIURIL     HYDROcodone-acetaminophen 5-325 MG per tablet  Commonly known as: Norco  Take 1 tablet by mouth every 6 hours as needed for Pain for up to 30 days.     hyoscyamine 125 MCG sublingual tablet  Commonly known as: LEVSIN/SL     ibuprofen 200 MG tablet  Commonly known as: ADVIL;MOTRIN     LANTUS SC     LORazepam 1 MG tablet  Commonly known as: ATIVAN     losartan 100 MG tablet  Commonly known as: COZAAR     NovoLOG FlexPen 100 UNIT/ML injection pen  Generic drug: insulin aspart     ONE TOUCH ULTRA TEST strip  Generic drug: blood glucose test strips     OZEMPIC (0.25 OR 0.5 MG/DOSE) SC      pravastatin 20 MG tablet  Commonly known as: PRAVACHOL     zolpidem 10 MG tablet  Commonly known as: AMBIEN              Time Spent on discharge is more than 45 minutes in the examination, evaluation, counseling and review of medications and discharge plan.      Signed:    Thank you Jeneen Rinks T. Bowlus, MD for the opportunity to be involved in this patient's care.    Electronically signed by Quentin Cornwall, DO on 06/26/2021 at 10:39 AM

## 2021-06-26 NOTE — Procedures (Signed)
12 lead EKG completed. Results handed to Jessica RN. Catalaya Garr CET

## 2021-06-26 NOTE — Plan of Care (Signed)
Problem: Discharge Planning  Goal: Discharge to home or other facility with appropriate resources  Outcome: Completed  Flowsheets (Taken 06/26/2021 0914)  Discharge to home or other facility with appropriate resources: Identify barriers to discharge with patient and caregiver     Problem: Pain  Goal: Verbalizes/displays adequate comfort level or baseline comfort level  Outcome: Completed     Problem: Safety - Adult  Goal: Free from fall injury  Outcome: Completed

## 2021-07-01 LAB — EKG 12-LEAD
Atrial Rate: 113 {beats}/min
Atrial Rate: 99 {beats}/min
P Axis: 39 degrees
P-R Interval: 190 ms
Q-T Interval: 360 ms
Q-T Interval: 406 ms
QRS Duration: 74 ms
QRS Duration: 76 ms
QTc Calculation (Bazett): 462 ms
QTc Calculation (Bazett): 592 ms
R Axis: -15 degrees
R Axis: 13 degrees
T Axis: 39 degrees
T Axis: 42 degrees
Ventricular Rate: 128 {beats}/min
Ventricular Rate: 99 {beats}/min

## 2021-07-01 LAB — CULTURE, BLOOD 1

## 2021-07-01 LAB — CULTURE, BLOOD 2

## 2021-07-08 LAB — ALT/SGPT ALANINE TRANSAMINASE: ALT: 62 U/L — ABNORMAL HIGH (ref 5–40)

## 2021-07-08 LAB — HEPATITIS A ANTIBODY, IGM: Hep A IgM: NONREACTIVE

## 2021-07-09 LAB — HEPATITIS A ANTIBODY, TOTAL: Hep A Total Ab: NONREACTIVE

## 2021-07-15 LAB — HEPATIC FUNCTION PANEL
ALT: 22 U/L (ref 5–40)
AST: 19 U/L (ref 9–40)
Albumin: 4.8 g/dL (ref 3.5–5.2)
Alk Phosphatase: 133 U/L (ref 40–140)
Bilirubin, Direct: <0.2 mg/dL (ref 0.0–0.3)
Total Bilirubin: 0.5 mg/dL (ref <=1.2)
Total Protein: 7.3 g/dL (ref 6.1–8.3)

## 2021-07-15 LAB — HEPATITIS C ANTIBODY: Hepatitis C Ab: NONREACTIVE

## 2021-07-15 LAB — HEPATITIS B SURFACE ANTIGEN: Hepatitis B Surface Ag: NONREACTIVE

## 2021-07-18 ENCOUNTER — Encounter

## 2021-07-20 ENCOUNTER — Ambulatory Visit: Admit: 2021-07-20 | Discharge: 2021-07-20 | Payer: PRIVATE HEALTH INSURANCE | Attending: Family

## 2021-07-20 DIAGNOSIS — M961 Postlaminectomy syndrome, not elsewhere classified: Secondary | ICD-10-CM

## 2021-07-20 MED ORDER — HYDROCODONE-ACETAMINOPHEN 5-325 MG PO TABS
5-325 MG | ORAL_TABLET | Freq: Four times a day (QID) | ORAL | 0 refills | Status: DC | PRN
Start: 2021-07-20 — End: 2021-08-13

## 2021-07-20 NOTE — Progress Notes (Signed)
[facility] [place] [facility]  [facility]  [address]  [place] [address]  Dept: [phone]  Dept Fax: [phone]  Loc: [phone]    Visit Date: 07/20/2021    Functionality Assessment/Goals Worksheet     On a scale of 0 (Does not Interfere) to 10 (Completely Interferes)     1.  Which number describes how during the past week pain has interfered with       the following:  A.  General Activity:  7  B.  Mood: 5  C.  Walking Ability:  7  D.  Normal Work (Includes both work outside the home and housework):  7  E.  Relations with Other People:   0  F.  Sleep:   3  G.  Enjoyment of Life:   4    2.  Patient Prefers to Take their Pain Medications:     [x]   On a regular basis   [x]   Only when necessary    []   Does not take pain medications    3.  What are the Patient's Goals/Expectations for Visiting Pain Management?     [x]   Learn about my pain    [x]   Receive Medication   []   Physical Therapy     []   Treat Depression   []   Receive Injections    []   Treat Sleep   []   Deal with Anxiety and Stress   []   Treat Opoid Dependence/Addiction   []   Other:      HPI:   Theresa Vargas is a 66 y.o. female is here today for    Chief Complaint: Low back pain. SI pain     HPI   2.5 month FU.   Continues to has pain in low back axial across into bilateral SI area/ buttocks - constant aching pain. Pain level depends on activity and increases with increased activity.     Denies any radicular pain   Current pain medications remain effective in decreasing pain to a tolerable level. Able to get around.   Pain increases with bending, lifting, twisting , walking, standing, getting up and down, and housework or working at job.      Medications reviewed. Patient denies side effects with medications. Patient states she is taking medications as prescribed. Shedenies receiving pain medications from other sources. She  had 1 ER visit since last visit for virus      Pain  scale with out pain medications or at its worst is 8/10.  Pain scale with pain medications or at its best is 0/10.  Last dose of Norco was yesterday   Drug screen reviewed from 05/18/2021 and was appropriate  Pill count completed  today and WNL: Yes      The patienthas No Known Allergies.      Subjective:      Review of Systems   Constitutional: Negative.    Eyes: Negative.    Respiratory: Negative.     Cardiovascular: Negative.  Negative for chest pain, palpitations and leg swelling.   Gastrointestinal: Negative.    Musculoskeletal:  Positive for arthralgias, back pain, gait problem, joint swelling and myalgias. Negative for neck pain and neck stiffness.        No assist devices    Neurological:  Negative for dizziness, weakness, numbness and headaches.   Psychiatric/Behavioral:  Positive for dysphoric mood. Negative for sleep disturbance. The patient is not nervous/anxious.  Objective:     Vitals:    07/20/21 1035   BP: 124/74   Weight: 188 lb (85.3 kg)   Height: 5' 6.5" (1.689 m)       Physical Exam  Constitutional:       General: She is not in acute distress.     Appearance: Normal appearance. She is well-developed.   HENT:      Head: Normocephalic and atraumatic.      Right Ear: External ear normal.      Left Ear: External ear normal.      Nose: Nose normal.   Eyes:      Conjunctiva/sclera: Conjunctivae normal.      Pupils: Pupils are equal, round, and reactive to light.   Neck:      Thyroid: No thyromegaly.      Comments: Decreased range of motion with ear to shoulder both sides, tender posterior over bone   Cardiovascular:      Rate and Rhythm: Normal rate and regular rhythm.      Pulses: Normal pulses.      Heart sounds: Normal heart sounds. No murmur heard.    No friction rub. No gallop.   Pulmonary:      Effort: Pulmonary effort is normal. No respiratory distress.      Breath sounds: Normal breath sounds.   Abdominal:      General: Bowel sounds are normal.      Palpations: Abdomen is soft.    Musculoskeletal:         General: Tenderness present. No deformity.      Right shoulder: No tenderness. Normal range of motion.      Left shoulder: Tenderness and bony tenderness present. No swelling. Decreased range of motion. Decreased strength.      Left upper arm: No tenderness or bony tenderness.      Left elbow: Normal range of motion.      Left forearm: No tenderness or bony tenderness.      Cervical back: Normal range of motion and neck supple. No tenderness.      Thoracic back: No tenderness. Normal range of motion.      Lumbar back: Tenderness and bony tenderness present. Decreased range of motion. Negative right straight leg raise test and negative left straight leg raise test.        Back:       Right knee: Normal range of motion. No tenderness.      Left knee: Normal range of motion. No tenderness.   Skin:     General: Skin is warm and dry.      Findings: No erythema or rash.          Neurological:      General: No focal deficit present.      Mental Status: She is alert and oriented to person, place, and time.      Sensory: Sensory deficit present.      Motor: Weakness present. No atrophy or abnormal muscle tone.      Coordination: Coordination abnormal.      Gait: Gait normal.      Deep Tendon Reflexes: Reflexes are normal and symmetric.      Reflex Scores:       Tricep reflexes are 2+ on the right side and 2+ on the left side.       Bicep reflexes are 2+ on the right side and 2+ on the left side.       Brachioradialis reflexes are 2+ on  the right side and 2+ on the left side.       Patellar reflexes are 2+ on the right side and 2+ on the left side.       Achilles reflexes are 2+ on the right side and 2+ on the left side.     Comments: Bilateral lower and right upper extremity 5/5            Psychiatric:         Behavior: Behavior normal.         Thought Content: Thought content normal.     FABER  Patricks test  positive  Yeoman's  or Gaenslen's positive       Assessment:     1. Lumbar  post-laminectomy syndrome    2. Spinal stenosis of lumbar region with neurogenic claudication    3. Spondylosis of lumbar region without myelopathy or radiculopathy    4. SI (sacroiliac) pain    5. Sacroiliac inflammation (HCC)    6. Lumbar spondylosis    7. Chronic pain syndrome    8. Chronic, continuous use of opioids            Plan:      OARRS reviewed. Current MED: 20.00  Patient was offered naloxone for home.   Discussed long term side effects of medications, tolerance, dependency and addiction.  Previous UDS reviewed  UDS preformed today for compliance.  Patient told can not receive any pain medications from any other source.  No evidence of abuse, diversion or aberrant behavior.  Medications and/or procedures to improve function and quality of life- patient understanding with this and that may not be pain free  Discussed with patient about safe storage of medications at home  Discussed possible weaning of medication dosing dependent on treatment/procedure results.   Discussed with patient about risks with procedure including infection, reaction to medication, increased pain, or bleeding.  Procedure notes reviewed in detail   Received good relief from SI MBB but insurance wont cover SI RFA. Discussed advanced Si injection if needed to.   Discussed updating imaging (MRI and SCS again). Also discussed surgery referral if needed   Pain remains tolerable with medications. Continue Norco 5/325 QID prn- ordered refill   Is compliant.       Meds. Prescribed:   Orders Placed This Encounter   Medications    HYDROcodone-acetaminophen (NORCO) 5-325 MG per tablet     Sig: Take 1 tablet by mouth every 6 hours as needed for Pain for up to 30 days.     Dispense:  120 tablet     Refill:  0     Reduce doses taken as pain becomes manageable       Return in about 10 weeks (around 09/28/2021), or if symptoms worsen or fail to improve, for follow up  for medications.               Electronically signed by Simone Curia, APRN -  CNP on6/19/2023 at 10:53 AM

## 2021-07-27 ENCOUNTER — Encounter: Payer: PRIVATE HEALTH INSURANCE | Attending: Family

## 2021-08-13 ENCOUNTER — Encounter

## 2021-08-13 MED ORDER — HYDROCODONE-ACETAMINOPHEN 5-325 MG PO TABS
5-325 MG | ORAL_TABLET | Freq: Four times a day (QID) | ORAL | 0 refills | Status: DC | PRN
Start: 2021-08-13 — End: 2021-09-14

## 2021-08-13 NOTE — Telephone Encounter (Signed)
OARRS reviewed. UDS: + for  Fluoxetine, Clomipramine, Zolpidem, Lorazepam, Hydrocodone.   Last seen: 07/20/2021. Follow-up:   Future Appointments   Date Time Provider Department Center   09/28/2021 12:30 PM Simone Curia, APRN - CNP [facility] [place]

## 2021-09-14 ENCOUNTER — Encounter

## 2021-09-14 MED ORDER — HYDROCODONE-ACETAMINOPHEN 5-325 MG PO TABS
5-325 MG | ORAL_TABLET | Freq: Four times a day (QID) | ORAL | 0 refills | Status: DC | PRN
Start: 2021-09-14 — End: 2021-10-12

## 2021-09-14 NOTE — Telephone Encounter (Signed)
OARRS reviewed. UDS: + for  Fluoxetine, Clomipramine, Zolpidem, Lorazepam, Hydrocodone.   Last seen: 07/20/2021. Follow-up:   Future Appointments   Date Time Provider Department Center   09/28/2021 12:30 PM Simone Curia, APRN - CNP [facility] [place]

## 2021-09-28 ENCOUNTER — Ambulatory Visit: Admit: 2021-09-28 | Discharge: 2021-09-28 | Payer: PRIVATE HEALTH INSURANCE | Attending: Family

## 2021-09-28 DIAGNOSIS — M961 Postlaminectomy syndrome, not elsewhere classified: Secondary | ICD-10-CM

## 2021-09-28 NOTE — Progress Notes (Signed)
[facility] [place] [facility]  [facility]  [address]  [place] [address]  Dept: [phone]  Dept Fax: [phone]  Loc: [phone]    Visit Date: 09/28/2021    Functionality Assessment/Goals Worksheet     On a scale of 0 (Does not Interfere) to 10 (Completely Interferes)     1.  Which number describes how during the past week pain has interfered with       the following:  A.  General Activity:  6  B.  Mood: 1  C.  Walking Ability:  4  D.  Normal Work (Includes both work outside the home and housework):  6  E.  Relations with Other People:   0  F.  Sleep:   5  G.  Enjoyment of Life:   1    2.  Patient Prefers to Take their Pain Medications:     []   On a regular basis   [x]   Only when necessary    []   Does not take pain medications    3.  What are the Patient's Goals/Expectations for Visiting Pain Management?     []   Learn about my pain    [x]   Receive Medication   []   Physical Therapy     []   Treat Depression   [x]   Receive Injections    []   Treat Sleep   []   Deal with Anxiety and Stress   []   Treat Opoid Dependence/Addiction   []   Other:      HPI:   Theresa Vargas is a 66 y.o. female is here today for    Chief Complaint: Low back pain, SI pain     HPI   2.5 month FU. FU.   Continues to has pain in low back axial across into bilateral SI area/ buttocks - constant aching pain with periods of intermittent sharp pain. All localized in low back and patient denies any radicular pain.   Current pain medications remain effective in decreasing pain to a tolerable level. Able to get around. "sometimes pain goes away completely"     Pain increases with bending, lifting, twisting , walking, standing, getting up and down, and housework or working at job, "bending and picking over heavy granddaughter"     Planning on moving to at end of September and has an appointment with pain management there at beginning of October. States this may be  last visit with me at this clinic.l         Medications reviewed. Patient denies side effects with medications. Patient states she is taking medications as prescribed. Shedenies receiving pain medications from other sources. She denies any ER visits since last visit.    Pain scale with out pain medications or at its worst is 5-8/10 depending on activity  Pain scale with pain medications or at its best is 0-2/10.  Last dose of Norco was today   Drug screen reviewed from 07/20/2021 and was appropriate  Pill count completed  today and WNL: Yes      The patienthas No Known Allergies.      Subjective:      Review of Systems   Constitutional: Negative.    Eyes: Negative.    Respiratory: Negative.     Cardiovascular: Negative.  Negative for chest pain, palpitations and leg swelling.   Gastrointestinal: Negative.    Musculoskeletal:  Positive for arthralgias, back pain, gait problem, joint  swelling and myalgias. Negative for neck pain and neck stiffness.        No assist devices    Neurological:  Negative for dizziness, weakness, numbness and headaches.   Psychiatric/Behavioral:  Negative for dysphoric mood and sleep disturbance. The patient is not nervous/anxious.      Objective:     Vitals:    09/28/21 1229   BP: 122/80   Weight: 188 lb (85.3 kg)   Height: 5' 6.5" (1.689 m)       Physical Exam  Constitutional:       General: She is not in acute distress.     Appearance: Normal appearance. She is well-developed.   HENT:      Head: Normocephalic and atraumatic.      Right Ear: External ear normal.      Left Ear: External ear normal.      Nose: Nose normal.   Eyes:      Conjunctiva/sclera: Conjunctivae normal.      Pupils: Pupils are equal, round, and reactive to light.   Neck:      Thyroid: No thyromegaly.      Comments: Decreased range of motion with ear to shoulder both sides, tender posterior over bone   Cardiovascular:      Rate and Rhythm: Normal rate and regular rhythm.      Pulses: Normal pulses.      Heart sounds:  Normal heart sounds. No murmur heard.    No friction rub. No gallop.   Pulmonary:      Effort: Pulmonary effort is normal. No respiratory distress.      Breath sounds: Normal breath sounds.   Abdominal:      General: Bowel sounds are normal.      Palpations: Abdomen is soft.   Musculoskeletal:         General: Tenderness present. No deformity.      Right shoulder: No tenderness. Normal range of motion.      Left shoulder: Tenderness and bony tenderness present. No swelling. Decreased range of motion. Decreased strength.      Left upper arm: No tenderness or bony tenderness.      Left elbow: Normal range of motion.      Left forearm: No tenderness or bony tenderness.      Cervical back: Normal range of motion and neck supple. No tenderness.      Thoracic back: No tenderness. Normal range of motion.      Lumbar back: Tenderness and bony tenderness present. Decreased range of motion. Negative right straight leg raise test and negative left straight leg raise test.        Back:       Right knee: Normal range of motion. No tenderness.      Left knee: Normal range of motion. No tenderness.   Skin:     General: Skin is warm and dry.      Findings: No erythema or rash.          Neurological:      General: No focal deficit present.      Mental Status: She is alert and oriented to person, place, and time.      Sensory: Sensory deficit present.      Motor: Weakness present. No atrophy or abnormal muscle tone.      Coordination: Coordination abnormal.      Gait: Gait normal.      Deep Tendon Reflexes: Reflexes are normal and symmetric.  Reflex Scores:       Tricep reflexes are 2+ on the right side and 2+ on the left side.       Bicep reflexes are 2+ on the right side and 2+ on the left side.       Brachioradialis reflexes are 2+ on the right side and 2+ on the left side.       Patellar reflexes are 2+ on the right side and 2+ on the left side.       Achilles reflexes are 2+ on the right side and 2+ on the left side.      Comments: Bilateral lower and right upper extremity 5/5            Psychiatric:         Behavior: Behavior normal.         Thought Content: Thought content normal.     FABER  Patricks test  positive  Yeoman's  or Gaenslen's positive       Assessment:     1. Lumbar post-laminectomy syndrome    2. Spinal stenosis of lumbar region with neurogenic claudication    3. Spondylosis of lumbar region without myelopathy or radiculopathy    4. SI (sacroiliac) pain    5. Sacroiliac inflammation (HCC)    6. Lumbar spondylosis    7. Chronic pain syndrome    8. Other chronic pain            Plan:      OARRS reviewed. Current MED: 20.00  Patient was offered naloxone for home.   Discussed long term side effects of medications, tolerance, dependency and addiction.  Previous UDS reviewed  UDS preformed today for compliance.  Patient told can not receive any pain medications from any other source.  No evidence of abuse, diversion or aberrant behavior.  Medications and/or procedures to improve function and quality of life- patient understanding with this and that may not be pain free  Discussed with patient about safe storage of medications at home  Discussed possible weaning of medication dosing dependent on treatment/procedure results.   Discussed with patient about risks with procedure including infection, reaction to medication, increased pain, or bleeding.  Procedure notes reviewed in detail   Received good relief from SI MBB but insurance wont cover SI RFA. Discussed advanced Si injection if needed to.   Again discussed SCS trial.   Patient is moving top [place] permanently at end of September and will see pain management there.   Pain remains tolerable with medications. Continue Norco 5/325 QID prn- filled 09/18/2021. Will likely get one more prescription on 10/18/2021 before her move to [place]  Is compliant.       Meds. Prescribed:   No orders of the defined types were placed in this encounter.      Return for No FU needed as  patient moving permanantly to [place] next month. .               Electronically signed by Simone Curia, APRN - CNP on8/28/2023 at 12:42 PM

## 2021-10-12 ENCOUNTER — Telehealth

## 2021-10-12 MED ORDER — HYDROCODONE-ACETAMINOPHEN 5-325 MG PO TABS
5-325 MG | ORAL_TABLET | Freq: Four times a day (QID) | ORAL | 0 refills | Status: AC | PRN
Start: 2021-10-12 — End: 2021-11-17

## 2021-10-12 NOTE — Telephone Encounter (Signed)
Patient notified and voiced understanding

## 2021-10-12 NOTE — Telephone Encounter (Signed)
OARRS reviewed. UDS: + for  Fluoxetine, Clomipramine, Zolpidem, Lorazepam, Hydrocodone.   Last seen: 09/28/2021. Follow-up: No future appointments.

## 2021-10-12 NOTE — Telephone Encounter (Signed)
She will need another FU if she is not moved by beginning of November. I will cover her until then

## 2021-10-12 NOTE — Telephone Encounter (Signed)
Pt is calling and is not moving until November and is wanting to see if she needs another appt to get her pain medication refilled.  Please advise pt at [phone]

## 2021-10-15 LAB — BASIC METABOLIC PANEL
Anion Gap: 17 meq/L — ABNORMAL HIGH (ref 7.0–16.0)
BUN: 10 mg/dL (ref 8–23)
CO2: 25 meq/L (ref 19–31)
Calcium: 9.7 mg/dL (ref 8.5–10.5)
Chloride: 97 meq/L (ref 95–107)
Creatinine: 0.78 mg/dL (ref 0.60–1.30)
EGFR IF NonAfrican American: 85 mL/min/{1.73_m2} (ref >60)
Glucose: 155 mg/dL — ABNORMAL HIGH (ref 70–99)
Potassium: 4.2 meq/L (ref 3.5–5.4)
Sodium: 139 meq/L (ref 133–146)

## 2021-10-15 LAB — ALT/SGPT ALANINE TRANSAMINASE: ALT: 29 U/L (ref 5–40)

## 2021-10-15 LAB — HEMOGLOBIN A1C
AVERAGE GLUCOSE: 146 mg/dL — ABNORMAL HIGH (ref <117)
Hemoglobin A1C: 6.7 % — ABNORMAL HIGH (ref 4.2–5.6)

## 2021-10-15 LAB — LIPID PANEL W/ REFLEX DIRECT LDL
Chol/HDL Ratio: 4.2 RATIO (ref <4.44)
Cholesterol: 154 mg/dL (ref <200)
HDL: 37 mg/dL — ABNORMAL LOW (ref >39)
LDL Calculated: 87 mg/dL (ref <100)
LDL/HDL Ratio: 2.4 RATIO (ref <3.22)
Triglycerides: 149 mg/dL (ref <149)
VLDL Cholesterol Calculated: 30 mg/dL — ABNORMAL HIGH (ref <30)

## 2021-11-11 ENCOUNTER — Encounter

## 2021-11-12 MED ORDER — HYDROCODONE-ACETAMINOPHEN 5-325 MG PO TABS
5-325 MG | ORAL_TABLET | Freq: Four times a day (QID) | ORAL | 0 refills | Status: AC | PRN
Start: 2021-11-12 — End: 2021-12-17

## 2021-11-12 NOTE — Telephone Encounter (Signed)
This will be patients last prescription

## 2021-11-12 NOTE — Telephone Encounter (Signed)
OARRS reviewed. UDS: .   Last seen: 09/28/2021. Follow-up: No future appointments.    Pt said that she see's pain a management down in [place] on 12/08/2021.  Pt is still in [place] until 11/23/21.    Send to [facility] [address] [place]

## 2021-12-28 IMAGING — DX CERVICAL SPINE 3 VIEWS
1 series · 5 of 5 positions shown · non-contrast
Comparison: None prior.

________________________________________________________________________________________________ 
CERVICAL SPINE 3 VIEWS, 12/28/2021 [DATE]: 
CLINICAL INDICATION: Radiculopathy, Cervical Region and lumbar. Patient 
complains of chronic neck pain, and low back pain with bilat leg radiculopathy. 
Cervial surgery 8968, lumbar surgery 7373. No trauma.

[Series 1: AP · U · 0.14mm/px · 5 of 5 slices shown]
[im 1/5]
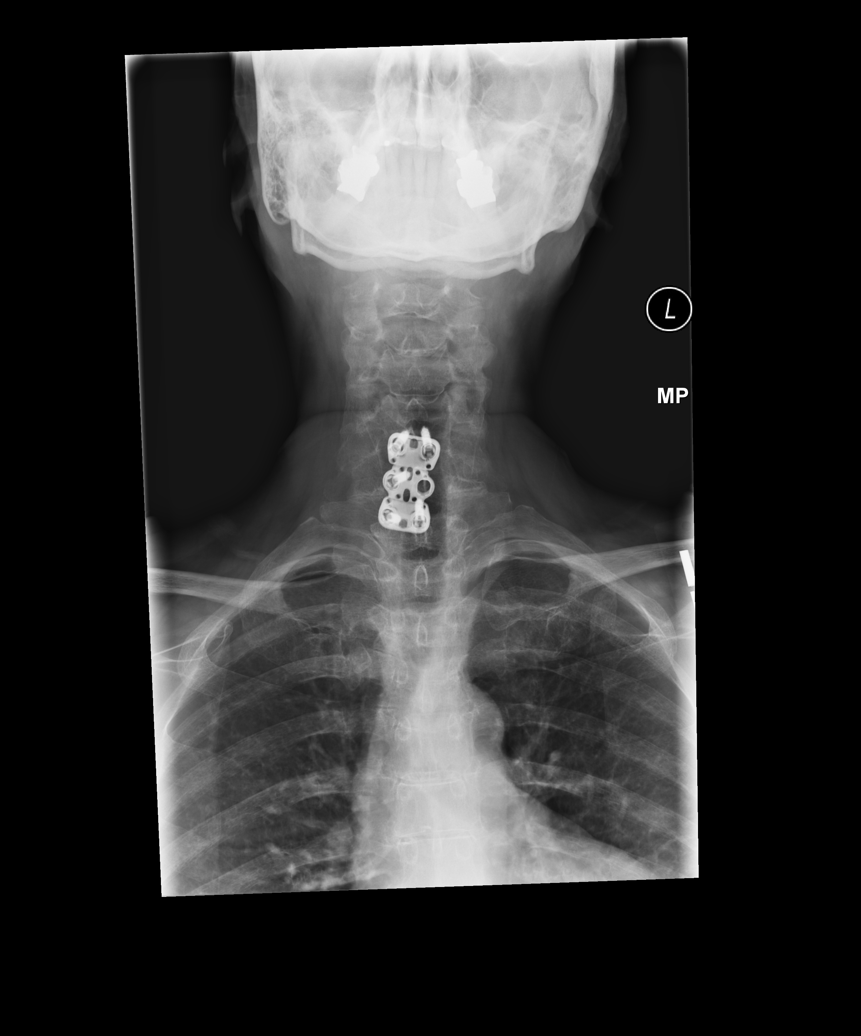
[im 2/5]
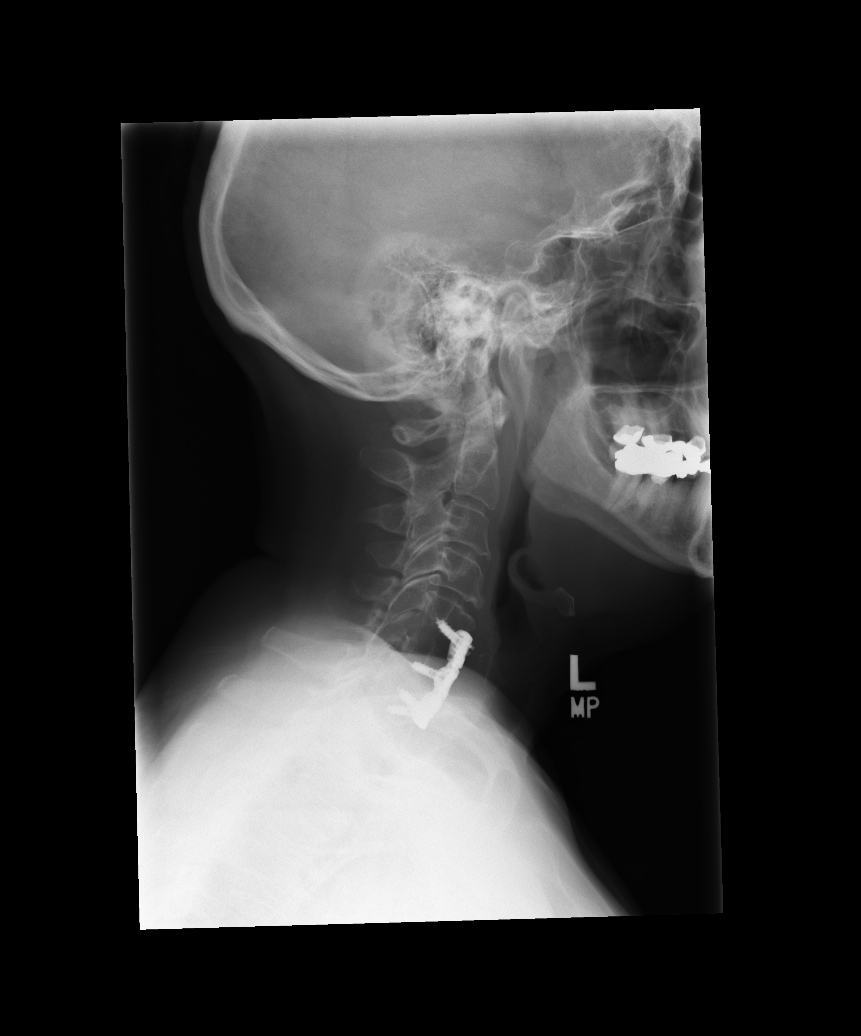
[im 3/5]
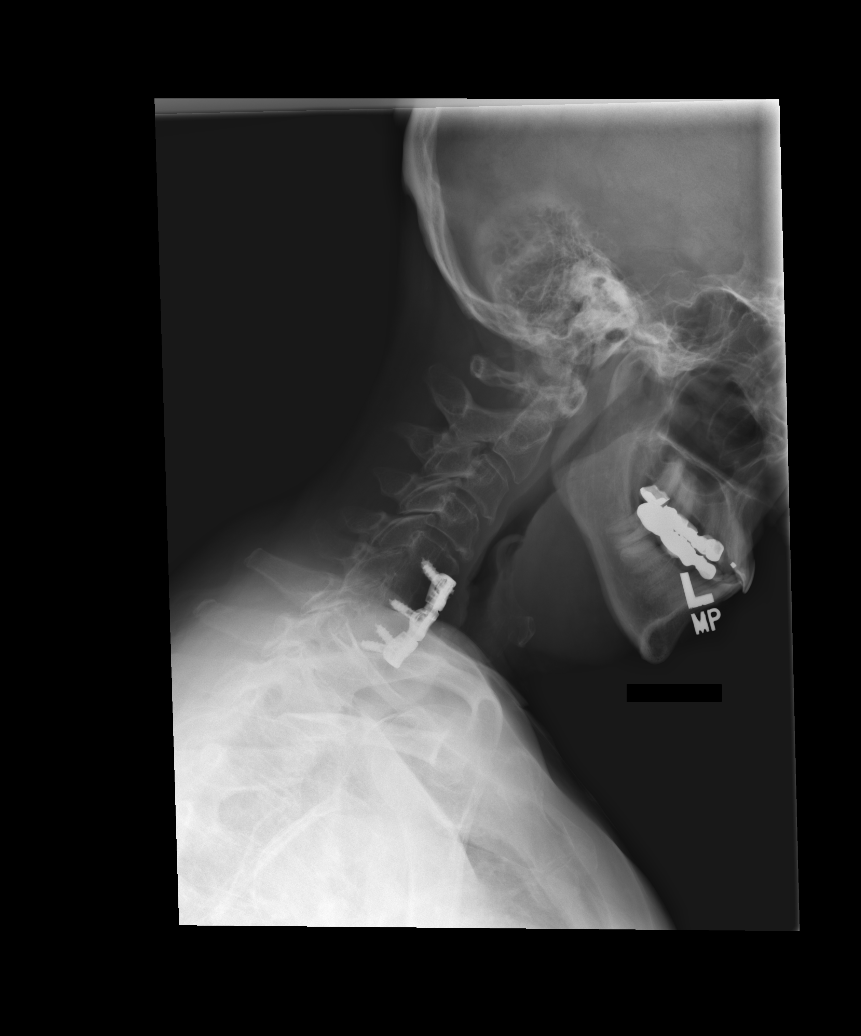
[im 4/5]
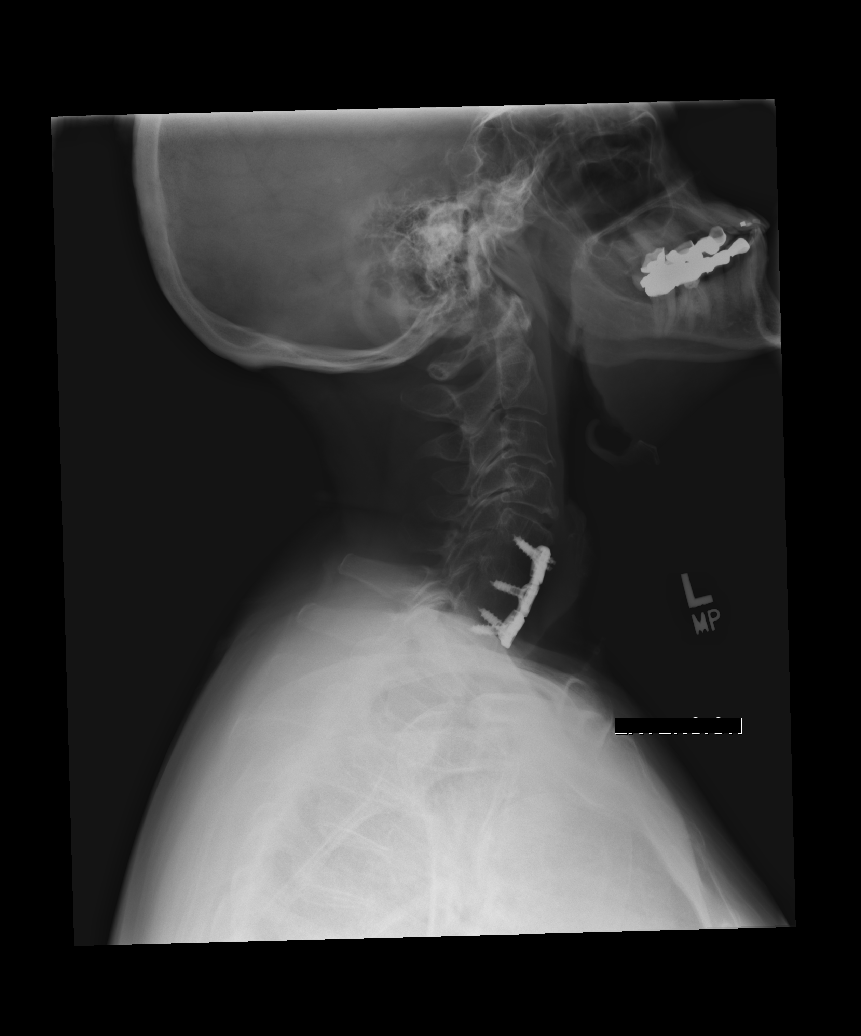
[im 5/5]
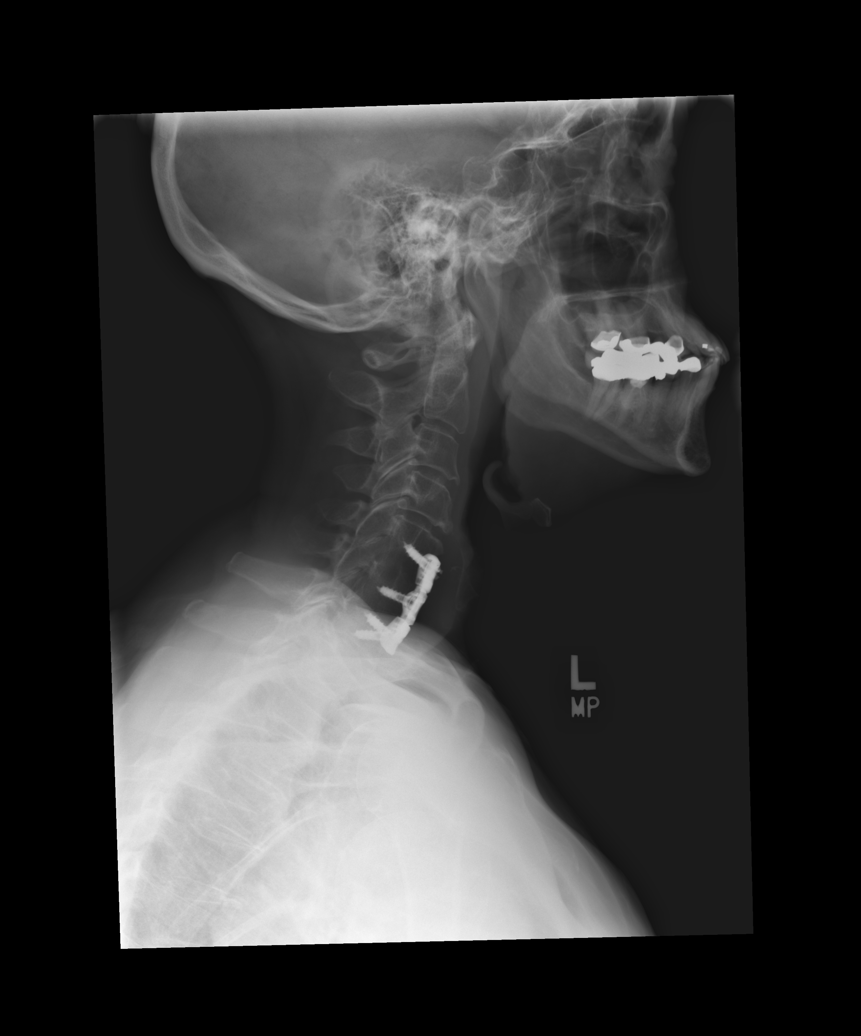

[5 of 5 positions shown; findings below may reference images not displayed]

FINDINGS: Postsurgical changes ACDF C5-C7. Hardware is intact. No hardware 
fracture. Mild retrolisthesis C3 on C4 and C4 on C5 in extension positioning 
with reduction in neutral and flexion. Mild scattered disc space narrowing and 
osteophytic spurring. Multilevel facet hypertrophy. Lung apices are clear.
IMPRESSION: Mild retrolisthesis C3 on C4 and C4 on C5 in extension positioning. 
ACDF C5-C7. 
Mild spondylotic changes.

## 2021-12-28 IMAGING — DX LUMBAR SPINE 3 VIEW
1 series · 4 of 4 positions shown · non-contrast
Comparison: None

________________________________________________________________________________________________ 
LUMBAR SPINE 3 VIEW, 12/28/2021 [DATE]: 
CLINICAL INDICATION: Radiculopathy, Cervical Region and lumbar. Patient 
complains of chronic neck pain, and low back pain with bilat leg radiculopathy. 
Cervial surgery 7960, lumbar surgery 5656. No trauma.

[Series 1: AP · U · 0.14mm/px · 4 of 4 slices shown]
[im 1/4]
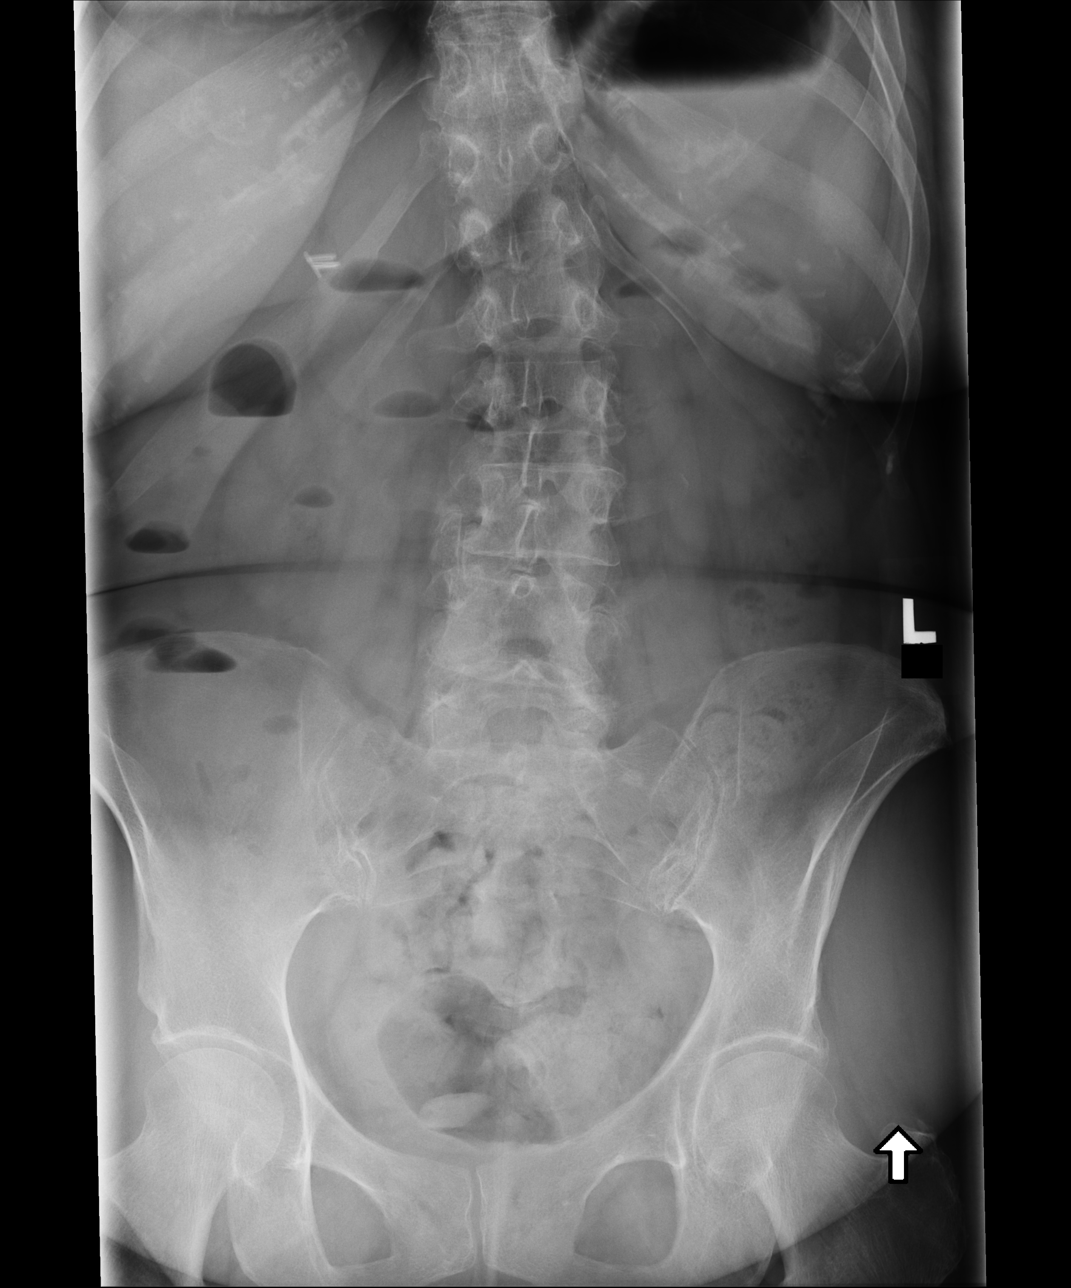
[im 2/4]
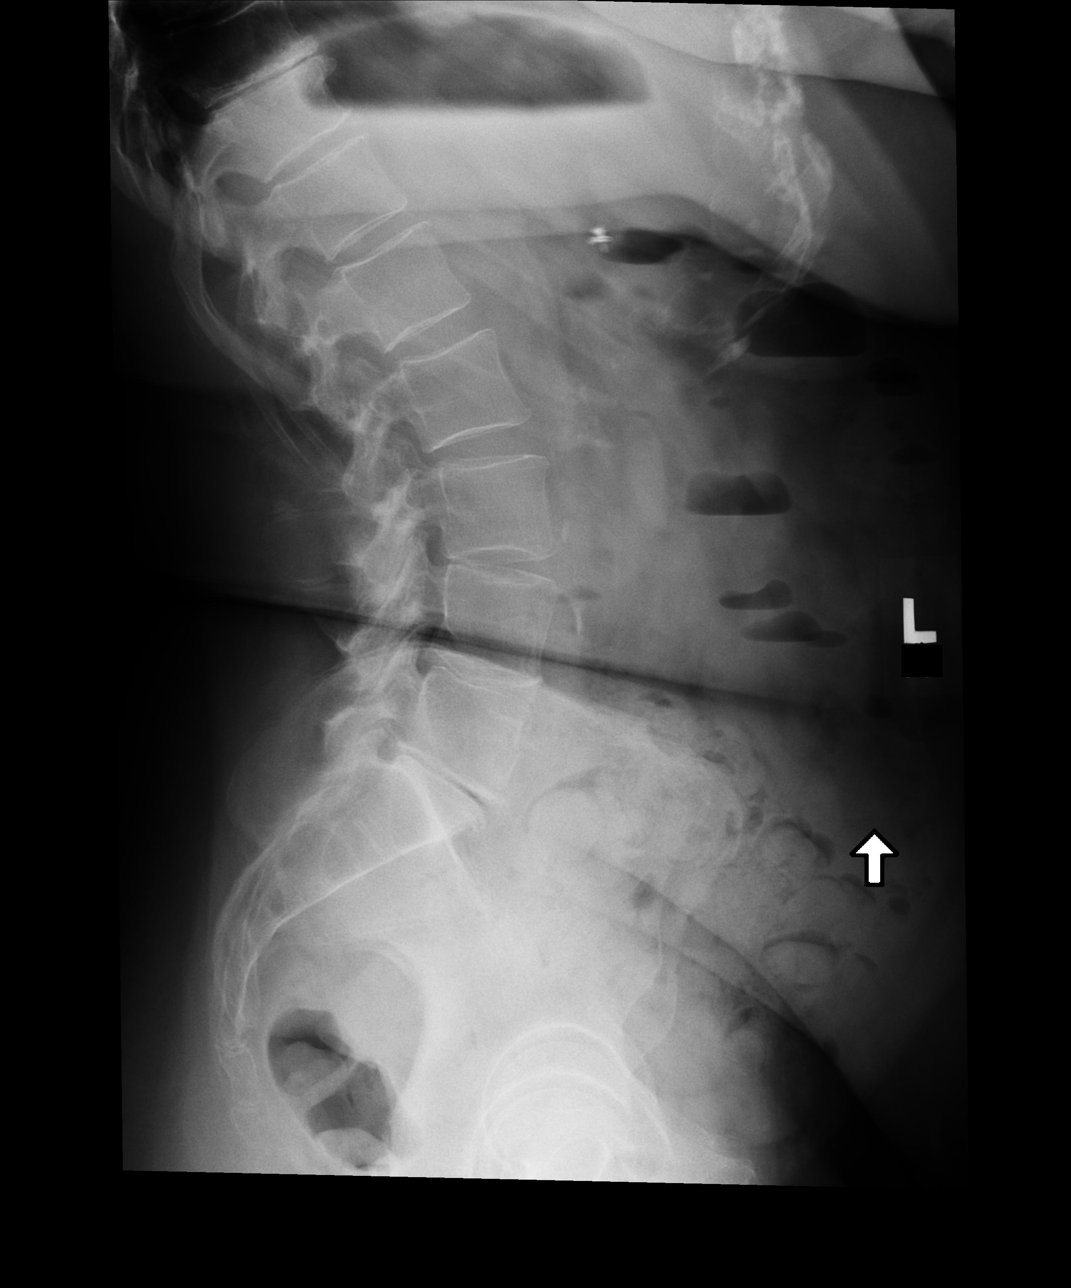
[im 3/4]
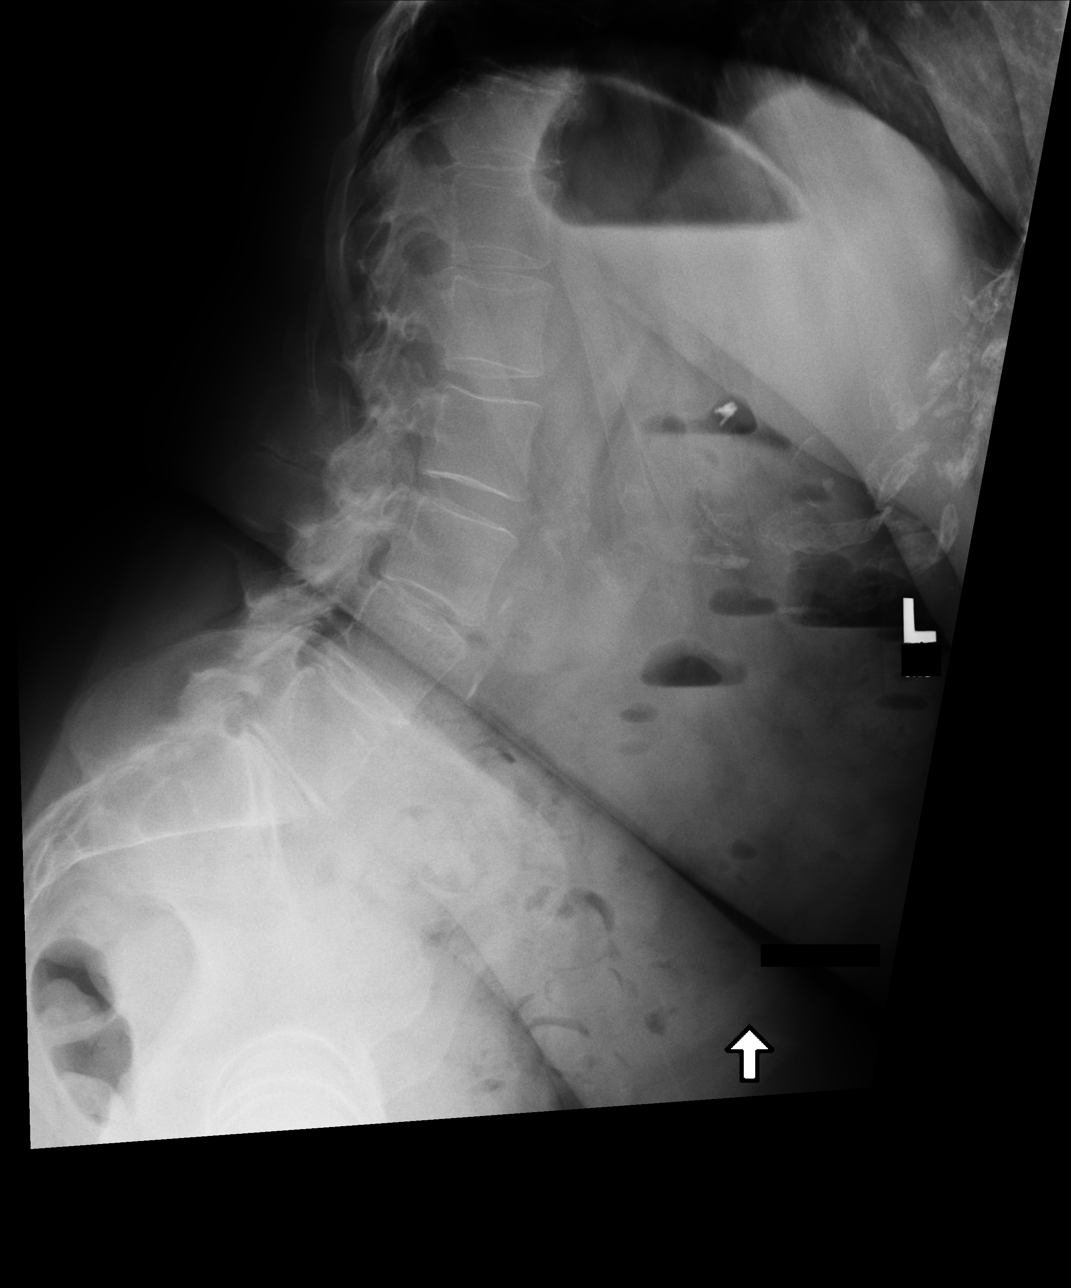
[im 4/4]
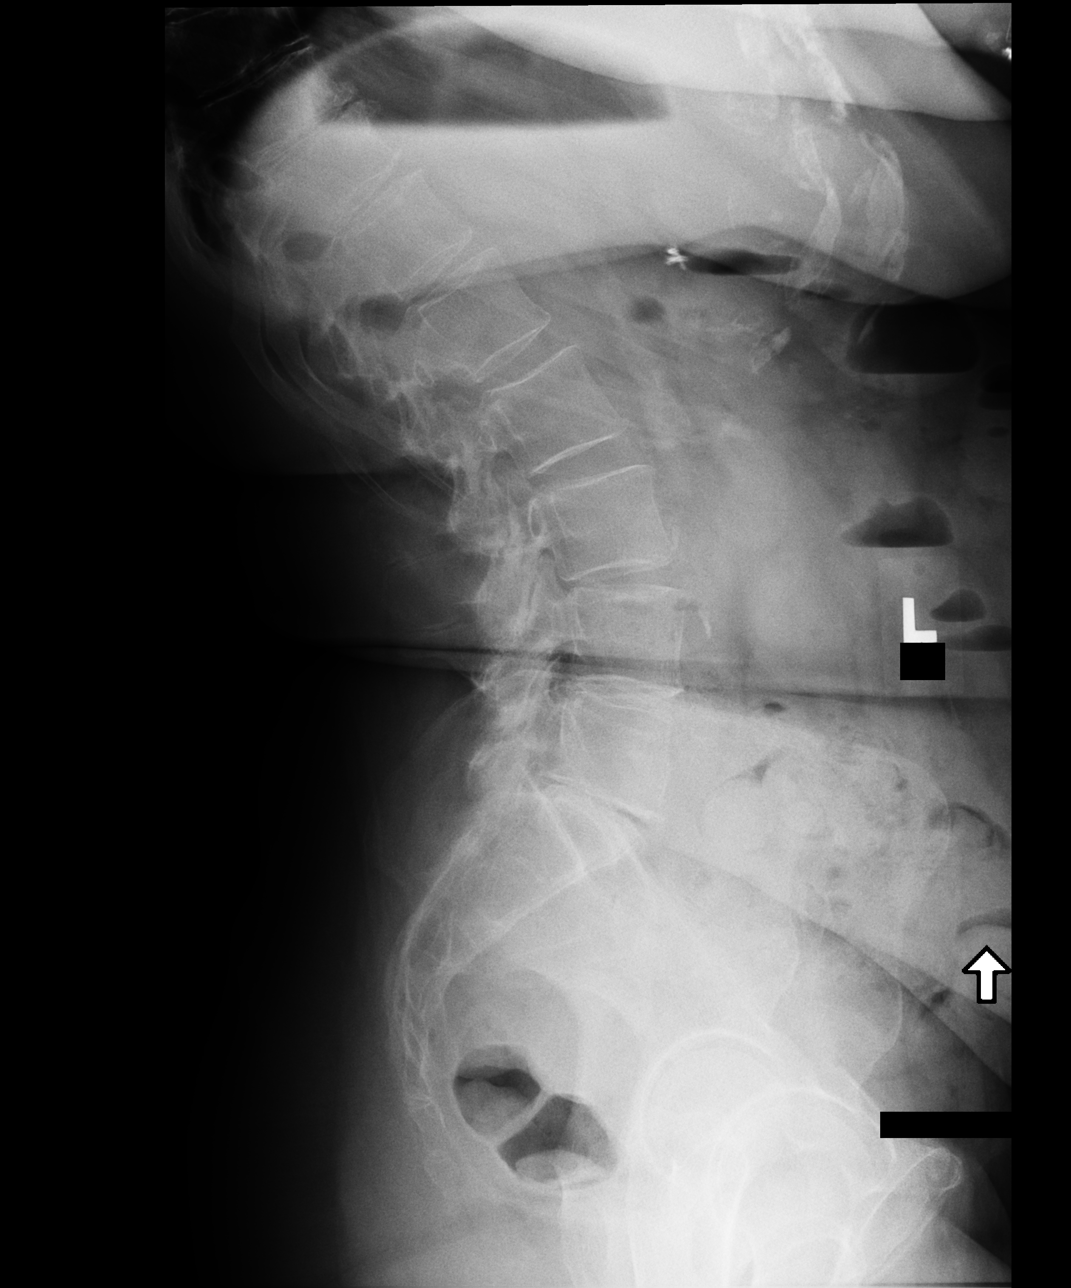

[4 of 4 positions shown; findings below may reference images not displayed]

FINDINGS: Lumbar vertebral heights are intact. Marked disc narrowing at L4-5 and 
L5-S1. Slight retrolisthesis at L3-4, approximately 1 mm.. Flexion and extension 
views show no dynamic subluxation. Mild thoracolumbar levoscoliosis. Facet 
degenerative changes are most pronounced from L3 through S1. Surgical clips at 
the porta hepatis.
IMPRESSION: Degenerative changes as described. Mild thoracolumbar levoscoliosis.

## 2022-02-05 IMAGING — DX SCOLIOSIS SERIES 2 VIEWS
1 series · 4 of 4 positions shown · non-contrast
Comparison: 12/28/2021 lumbar spine radiographs

________________________________________________________________________________________________ 
SCOLIOSIS SERIES 2 VIEWS, 02/05/2022 [DATE]: 
CLINICAL INDICATION: Other forms of scoliosis, lumbar region.

[Series 1: AP · U · 0.14mm/px · 4 of 4 slices shown]
[im 1/4]
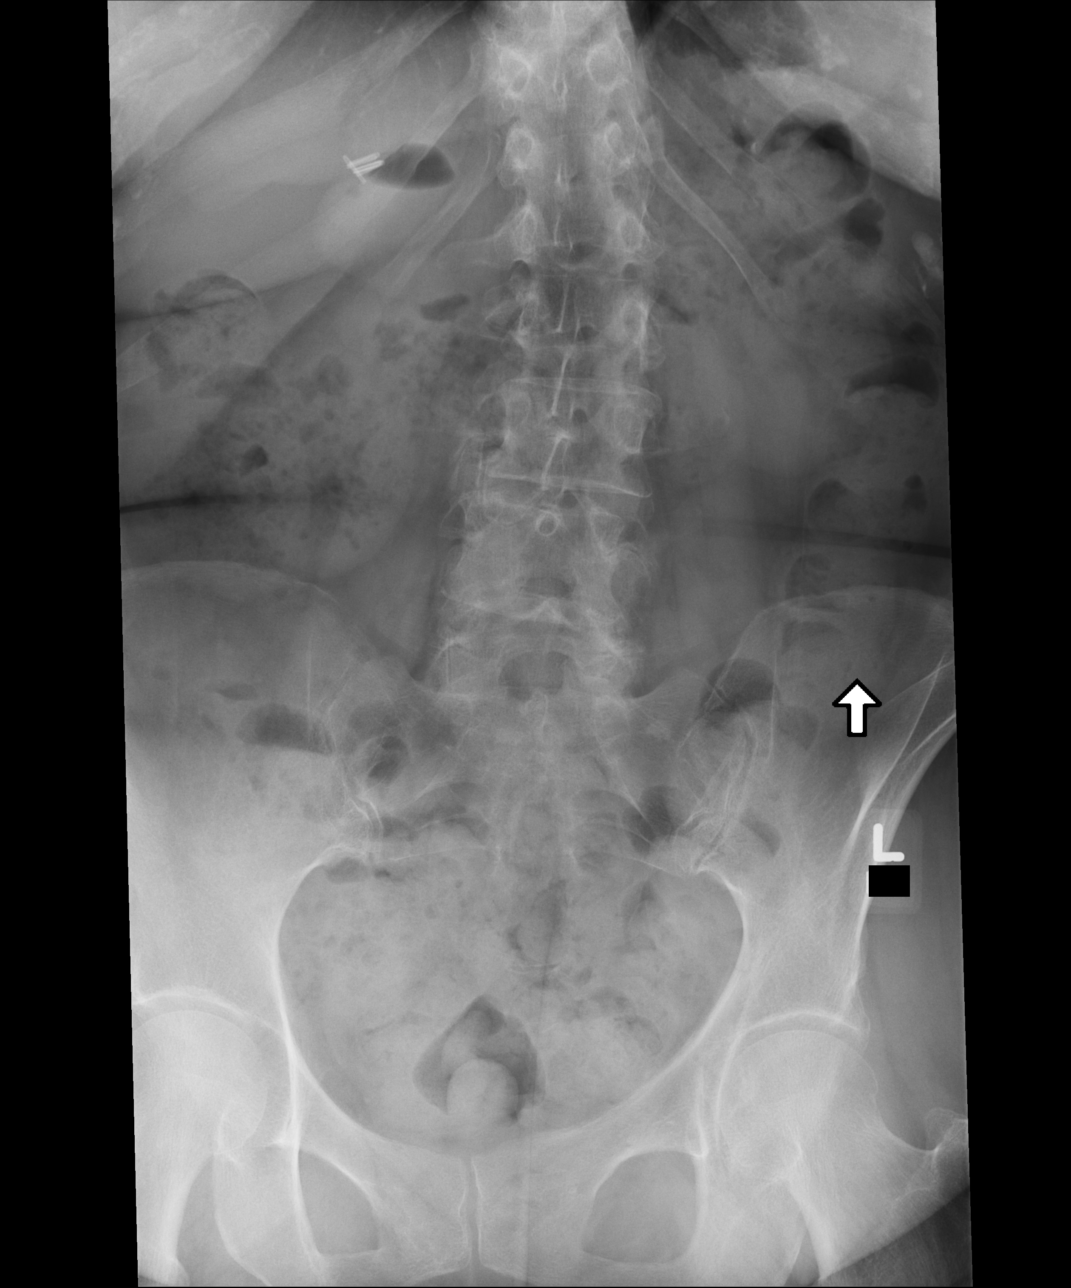
[im 2/4]
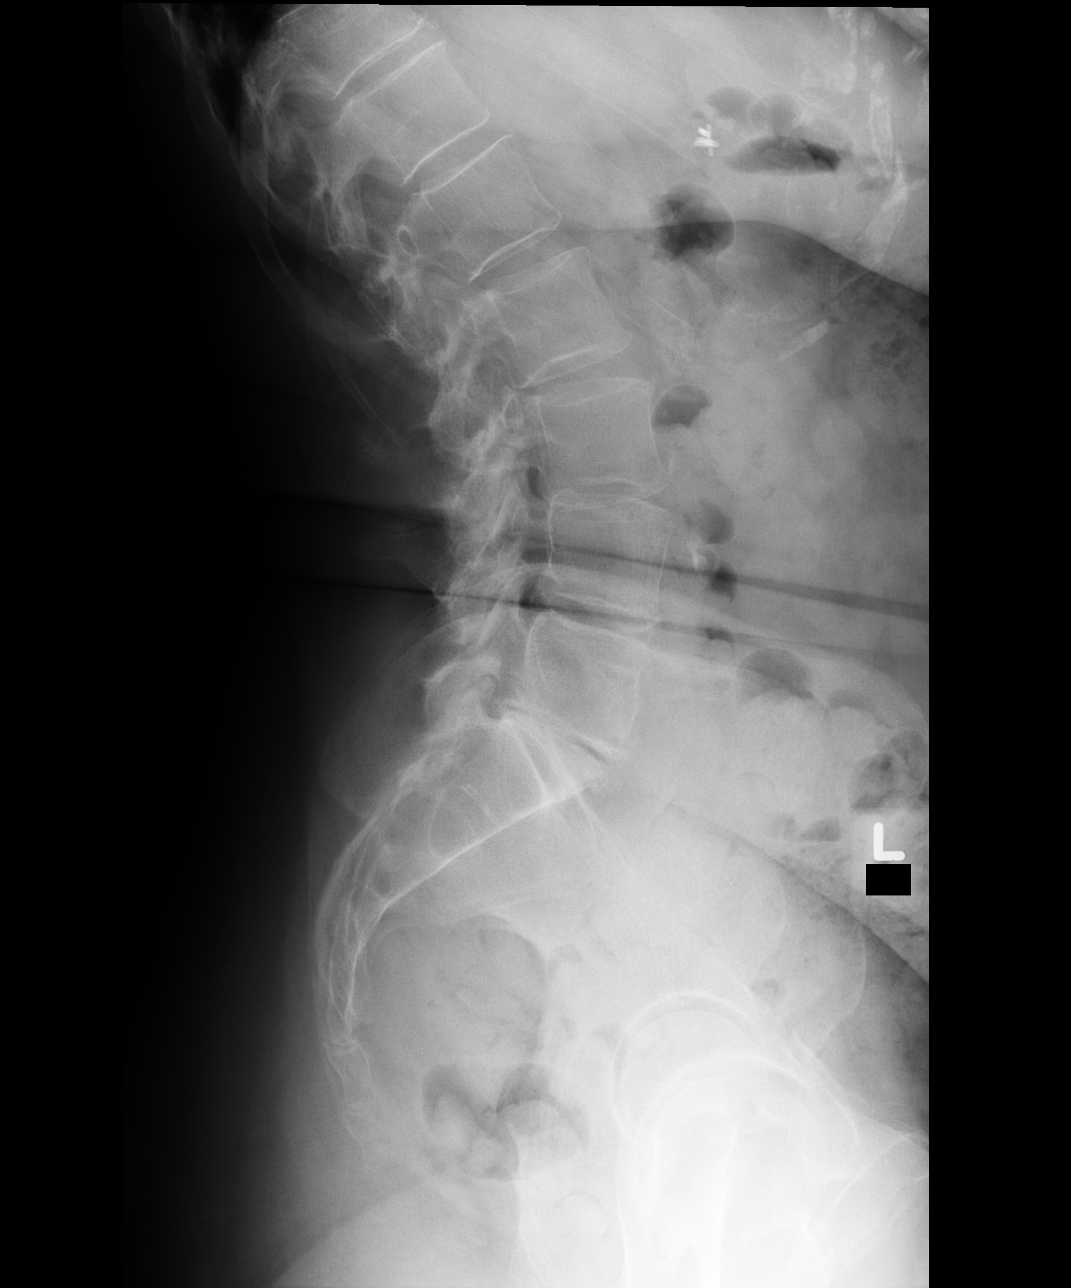
[im 3/4]
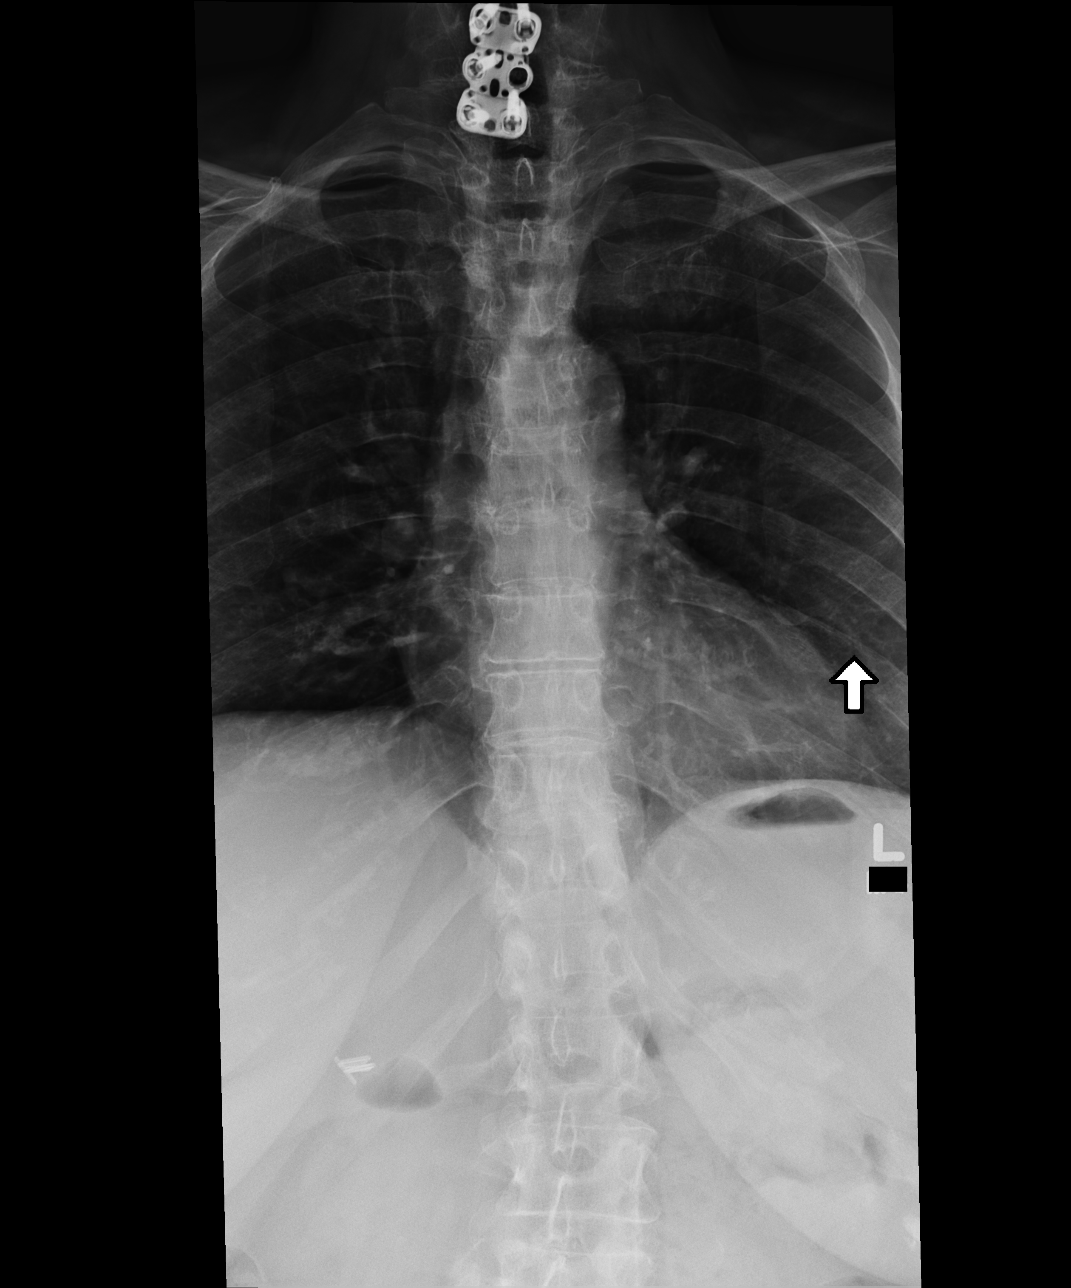
[im 4/4]
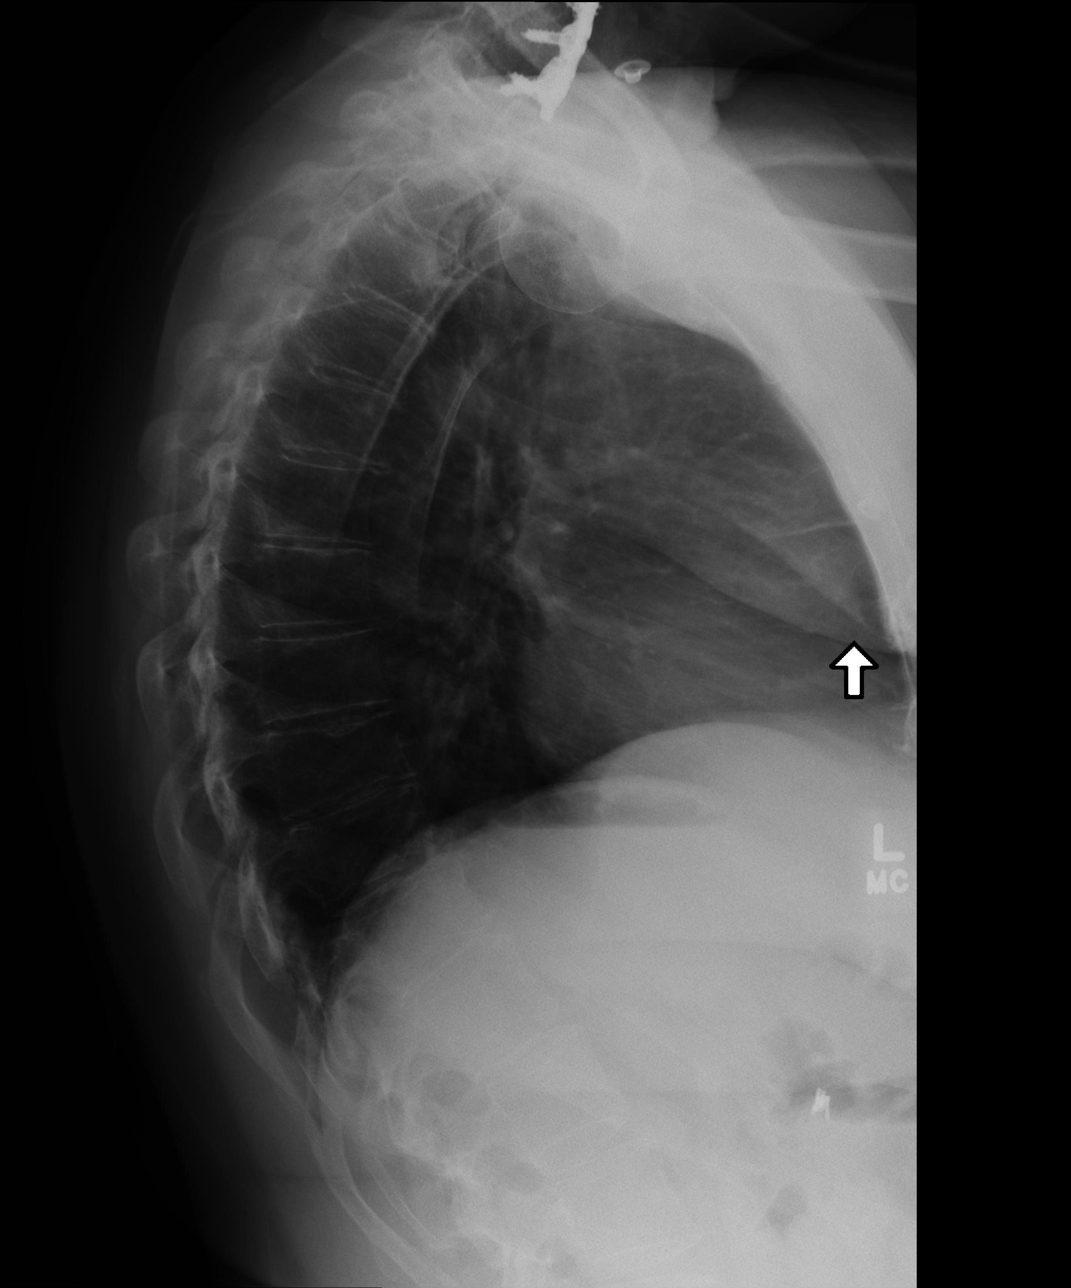

[4 of 4 positions shown; findings below may reference images not displayed]

FINDINGS: There is no fracture or subluxation. Multifocal disc space narrowing, 
which is marked at L5-S1. Mid/lower lumbar facet arthropathy. 15 degrees 
levoscoliosis. Osteopenia. Atherosclerosis. Cholecystectomy clips.
IMPRESSION: 1.  Degenerative change, most marked at L5-S1, and mild levoscoliosis. 
2.  Osteopenia: DXA with TBS (trabecular bone score) may be helpful for further 
evaluation.

## 2022-02-10 IMAGING — MR MRI LUMBAR SPINE WITHOUT CONTRAST
6 of 8 series · 12 of 48 positions shown · IV contrast (gadolinium)
Comparison: None

________________________________________________________________________________________________ 
MRI LUMBAR SPINE WITHOUT CONTRAST, 02/10/2022 [DATE]: 
CLINICAL INDICATION: Radiculopathy, lumbar region
TECHNIQUE: Multiplanar, multiecho position MR images of the lumbar spine were 
performed without intravenous gadolinium enhancement. Patient was scanned on a 
1.5T magnet.

[Series 101: survey · axial · 10.0mm · 1.25mm/px · 1 of 10 slices shown]
[im 1/10]
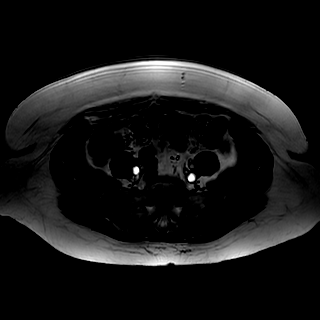

[Series 201: t2w_cor-surv · coronal · 6.0mm · 0.62mm/px · 1 of 10 slices shown]
[im 1/10]
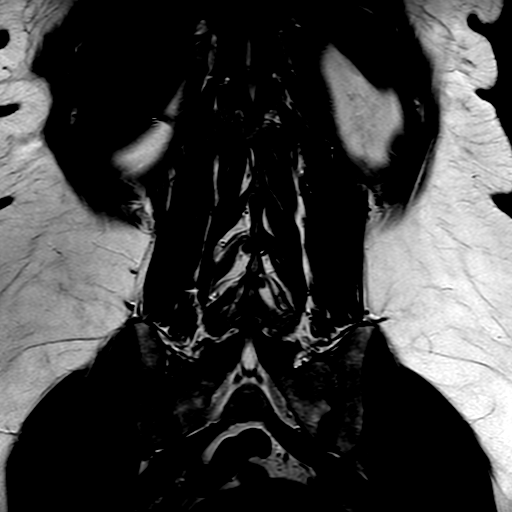

[Series 301: t1_tse_sag · sagittal · 4.0mm · 0.46mm/px · 3 of 19 slices shown]
[im 1/19]
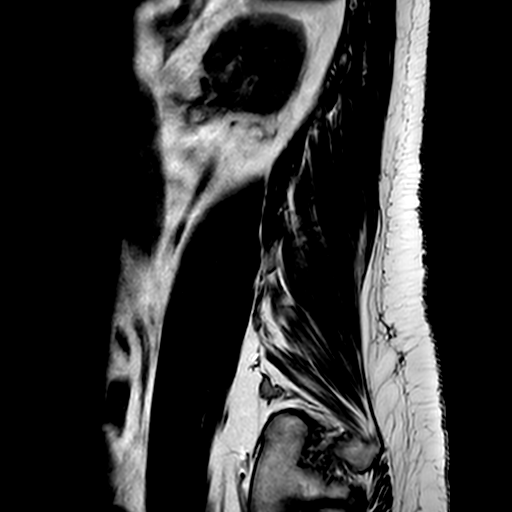
[im 10/19]
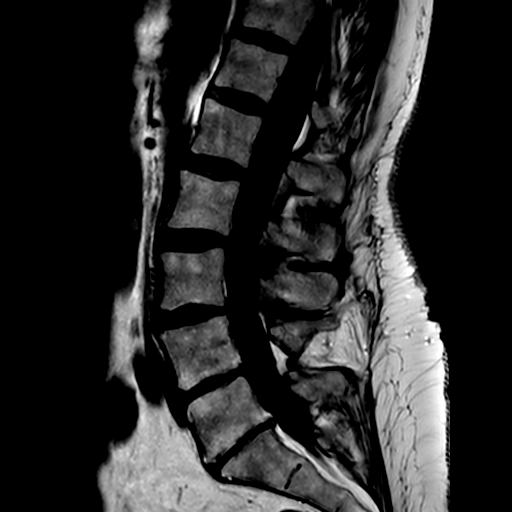
[im 19/19]
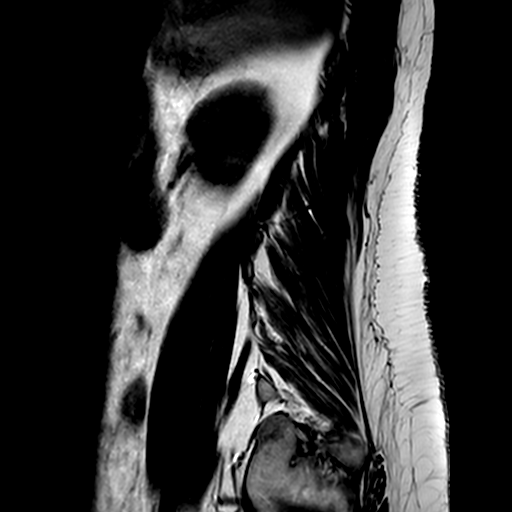

[Series 402: (id)_mdixon_tse · sagittal · 4.0mm · 0.37mm/px · 3 of 19 slices shown]
[im 1/19]
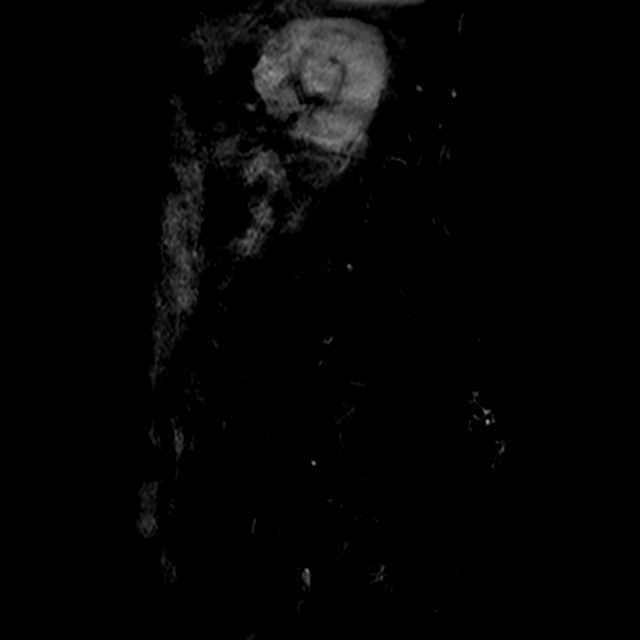
[im 10/19]
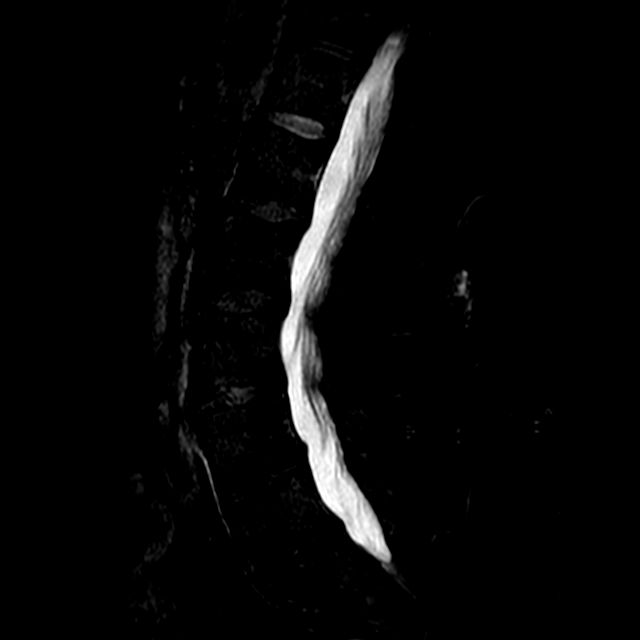
[im 19/19]
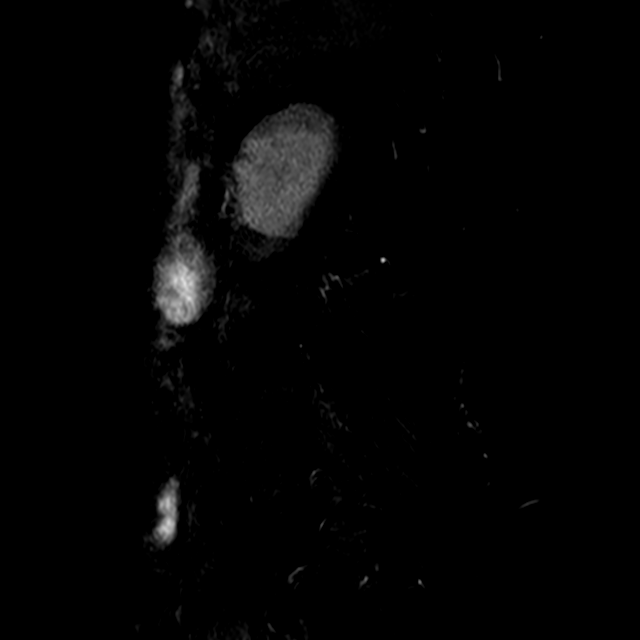

[Series 403: st2w_mdixon_tse · sagittal · 4.0mm · 0.37mm/px · 3 of 19 slices shown]
[im 1/19]
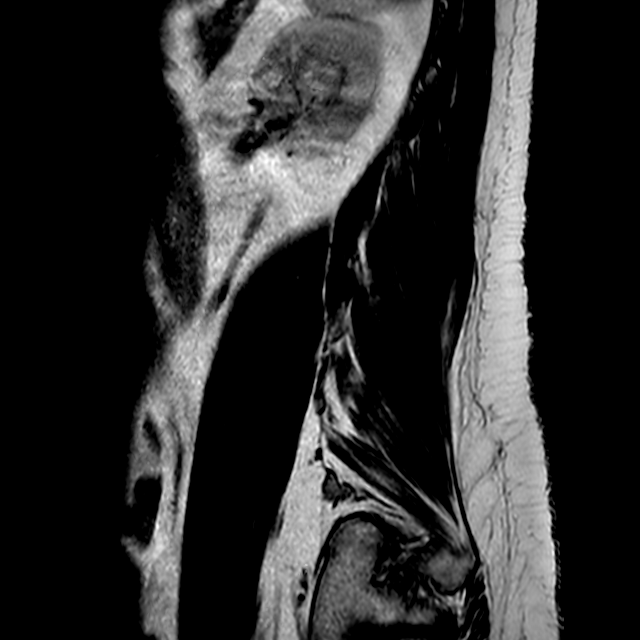
[im 10/19]
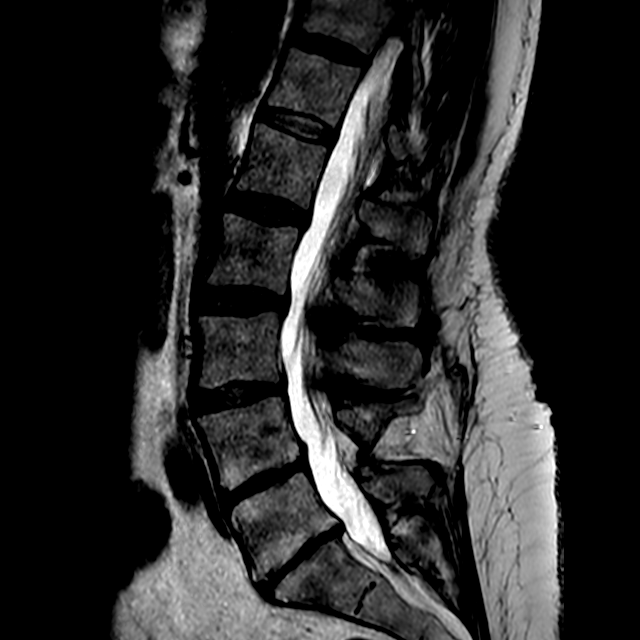
[im 19/19]
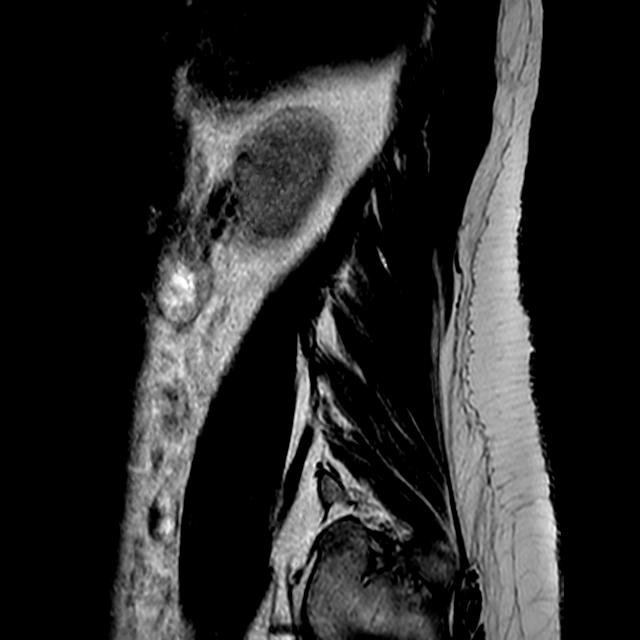

[Series 502: (id) view_ax mpr · axial · 1.0mm · 0.25mm/px · 1 of 136 slices shown]
[im 8/136]
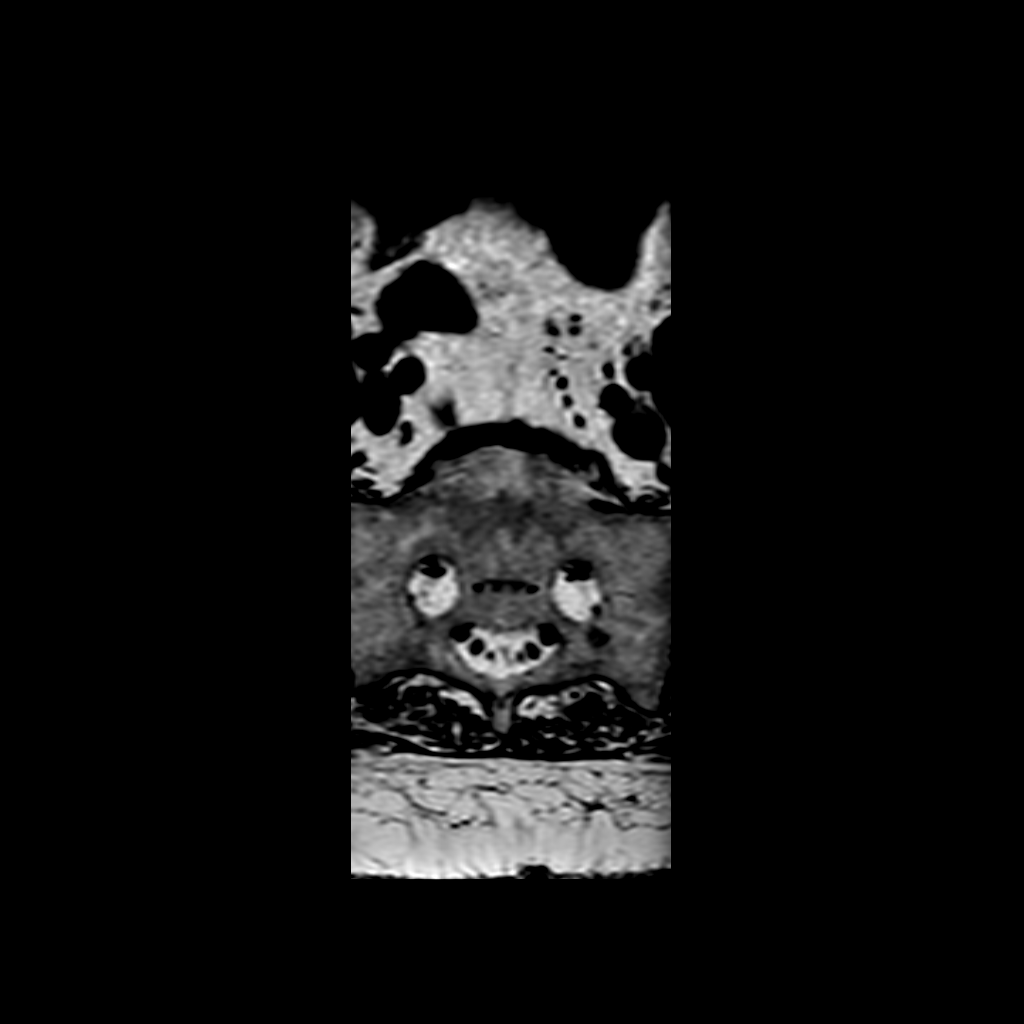

[12 of 48 positions shown; findings below may reference images not displayed]

FINDINGS: GENERAL: 
Nomenclature is based on 5 lumbar type vertebral bodies.     
ALIGNMENT: 11 degrees lumbar levoscoliosis 
VERTEBRAE: Laminectomy at L4-5. No fracture. Multilevel osteophytes.  
MARROW SIGNAL: No focal suspect signal abnormality. 0.6 cm left S1 bone island. 
CORD SIGNAL: Normal distal spinal cord and cauda equina. Conus medullaris 
terminates at L1. 
EXTRASPINAL STRUCTURES: Mild dorsal subcutaneous soft tissue swelling. 
Modic I-II: Type II Modic changes at L4-S1. 
Ligamentum Flavum > 2.5 mm: All levels except L4-5 
SEGMENTAL: 
T12-L1: Normal disc height and signal. No herniation. Normal facets. No spinal 
canal or neural foraminal stenosis. 
L1-L2: Normal disc height with disc desiccation. No herniation. Normal facets. 
No spinal canal or neural foraminal stenosis. 
L2-L3: Central annular tear, disc bulge and disc desiccation. Normal disc 
height. No herniation. Mild facet arthropathy. No central canal stenosis. Mild 
lateral recess stenosis, greater on the left. No neural foraminal stenosis. 
L3-L4: Disc bulge, mild disc space narrowing and disc desiccation. No 
herniation. Marked right and mild left facet arthropathy. Mild central canal 
stenosis. No neural foraminal stenosis. 
L4-L5: Laminectomy and ligament resection. Central disc extrusion with superior 
subligamentous extension measures 0.3 cm in AP dimensions, 0.7 cm in width and 
extends 0.5 cm superior to the disc space. Moderate disc space narrowing and 
disc desiccation. Mild facet arthropathy. No central canal stenosis. Mild 
lateral recess/neural foraminal stenosis. 
L5-S1: Osteophyte-disc complex, marked disc space narrowing and disc 
desiccation. No herniation. Mild facet arthropathy. No spinal canal or neural 
foraminal stenosis. 
IMPRESSION 
1.  Multifocal degenerative change (most marked at L4-S1) and mild 
levoscoliosis. 
2.  L2-L3 central annular tear and mild lateral recess stenosis. 
3.  L3-L4 mild central canal stenosis.  
4.  L4-L5 laminectomy, small central disc extrusion and mild lateral 
recess/neural foraminal stenosis.

## 2022-02-10 IMAGING — MR MRI CERVICAL SPINE W/WO CONTRAST
7 of 11 series · 26 of 48 positions shown · IV contrast (Gadolinium)
Comparison: Scoliosis radiograph February 05, 2022. Cervical spine x-ray December 28, 2021.

________________________________________________________________________________________________ 
MRI CERVICAL SPINE W/WO CONTRAST, 02/10/2022 [DATE]: 
CLINICAL INDICATION: Previous cervical fusion. Pain.
TECHNIQUE: Multiplanar, multiecho position MR images of the cervical spine were 
performed without and with 8.5 mL of Gadavist were injected intravenously by 
hand. 1.5 mL of Gadavist were discarded. Patient was scanned on a 1.5T magnet.

[Series 101: survey · axial · 10.0mm · 1.25mm/px · 1 of 10 slices shown (1 of 2)]
[im 1/10]
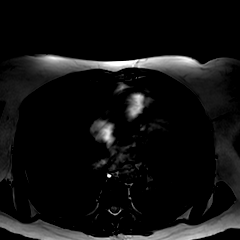

[Series 201: survey · axial · 10.0mm · 1.25mm/px · 1 of 10 slices shown (2 of 2)]
[im 1/10]
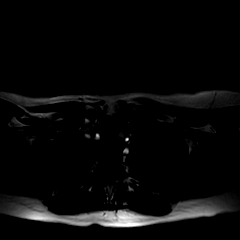

[Series 301: t2w_cor-surv · coronal · 5.0mm · 0.69mm/px · 2 of 7 slices shown]
[im 1/7]
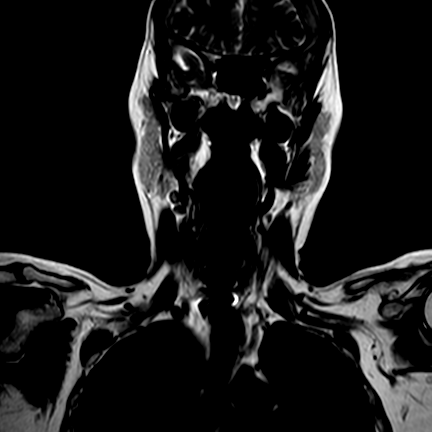
[im 7/7]
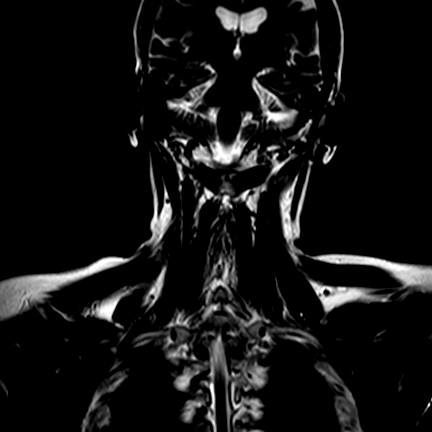

[Series 401: t1_sag · sagittal · 3.0mm · 0.39mm/px · 4 of 15 slices shown]
[im 1/15]
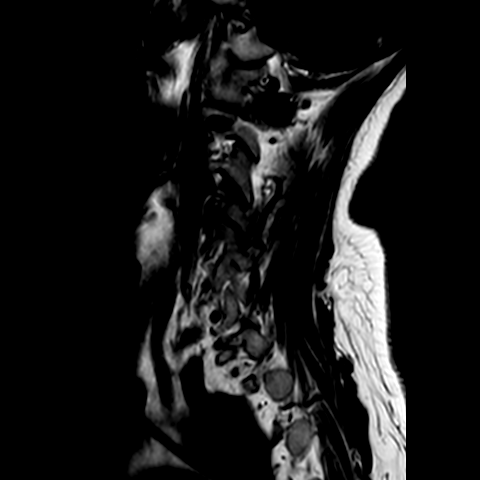
[im 5/15]
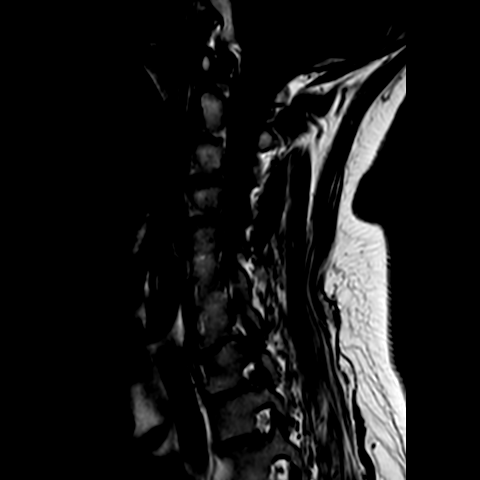
[im 10/15]
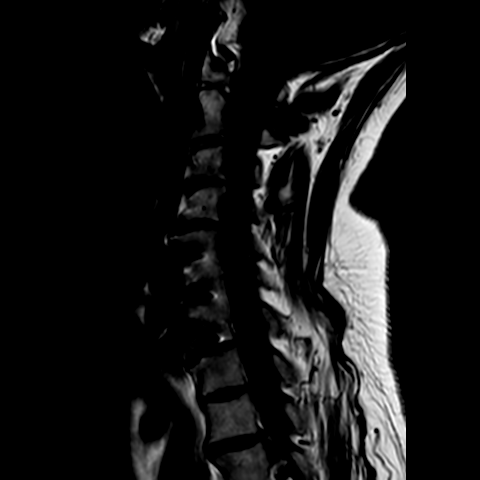
[im 15/15]
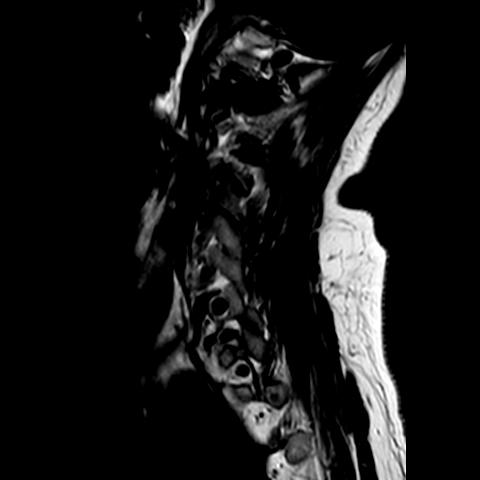

[Series 601: t2_ffe ax · axial · 3.0mm · 0.62mm/px · z∈[-106,-5]mm · 8 of 34 slices shown]
[im 1/34]
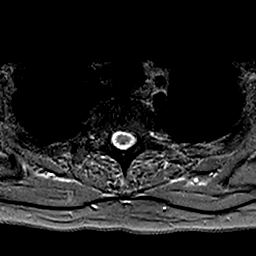
[im 5/34]
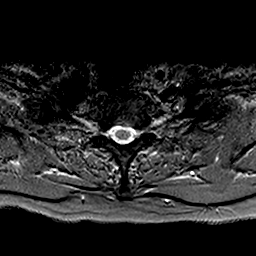
[im 10/34]
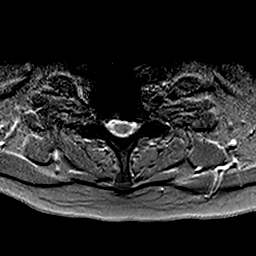
[im 15/34]
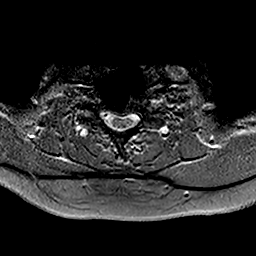
[im 19/34]
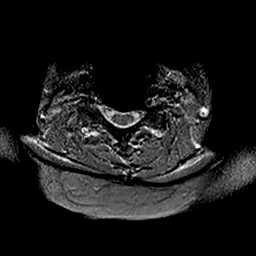
[im 24/34]
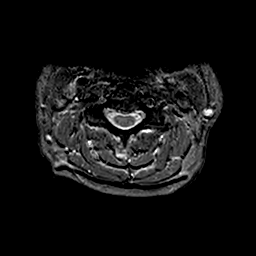
[im 29/34]
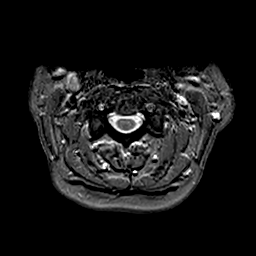
[im 34/34]
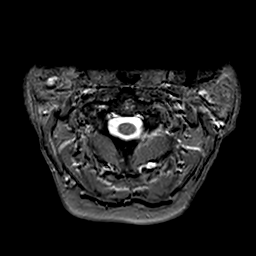

[Series 701: T2 · axial · 3.0mm · 0.31mm/px · z∈[-103,-8]mm · 8 of 32 slices shown]
[im 1/32]
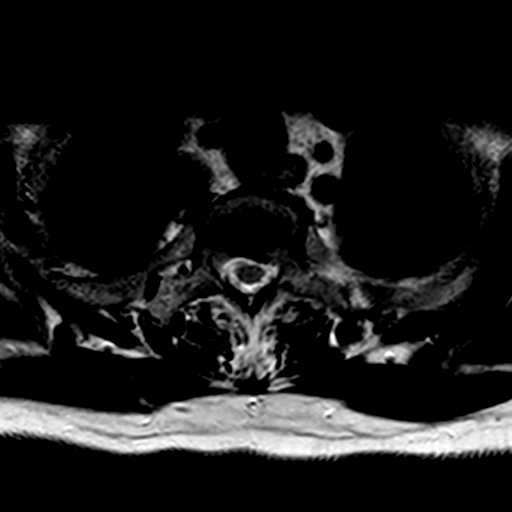
[im 5/32]
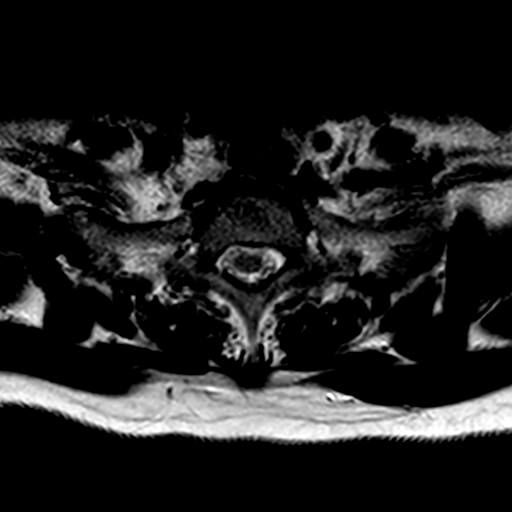
[im 9/32]
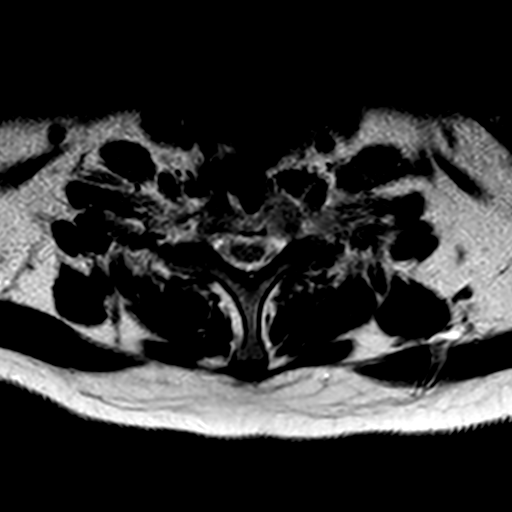
[im 14/32]
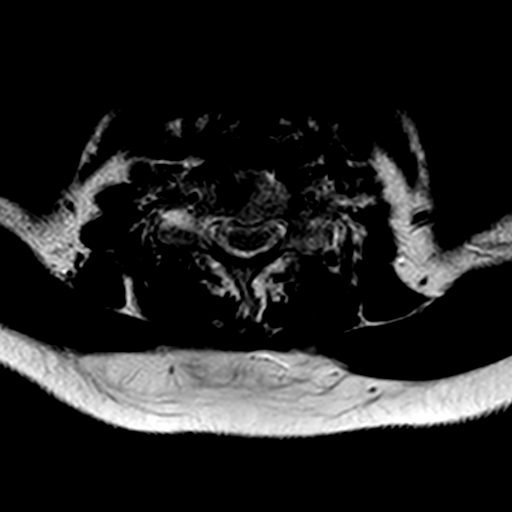
[im 18/32]
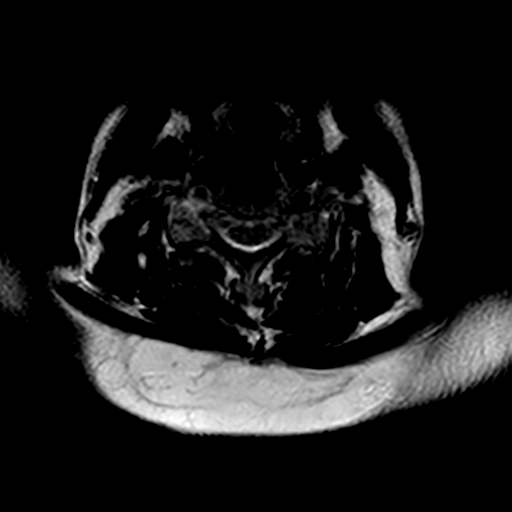
[im 23/32]
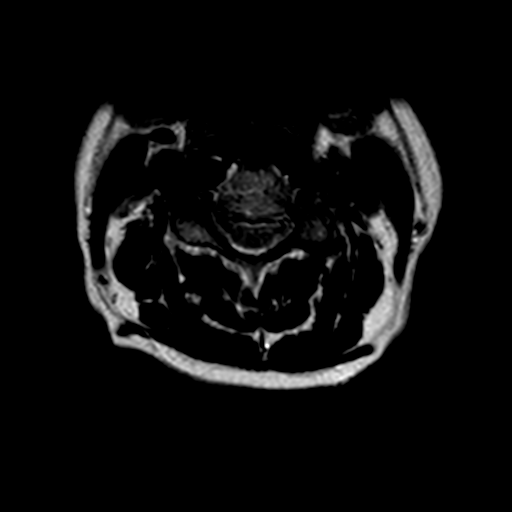
[im 27/32]
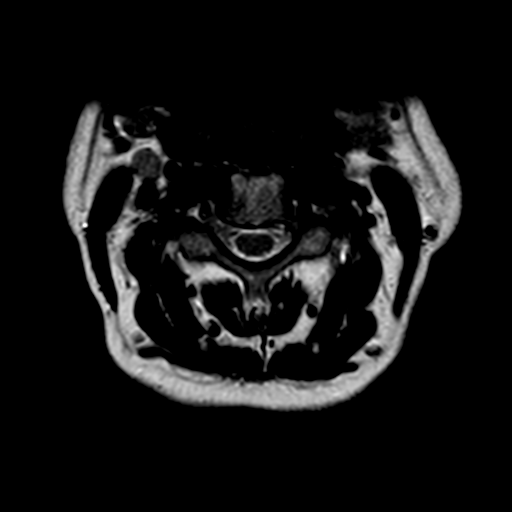
[im 32/32]
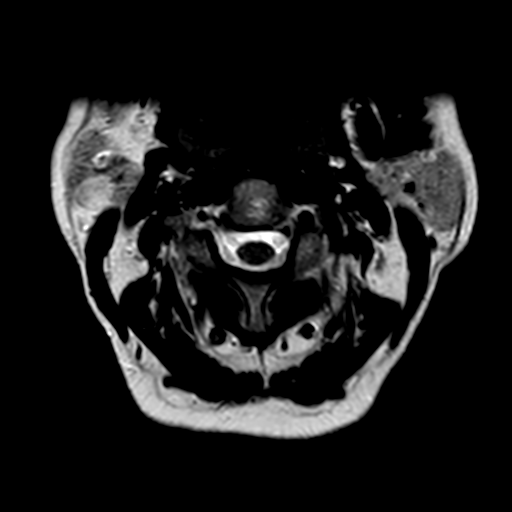

[Series 802: (id)_mdixon_tse · sagittal · 3.0mm · 0.42mm/px · 2 of 15 slices shown]
[im 1/15]
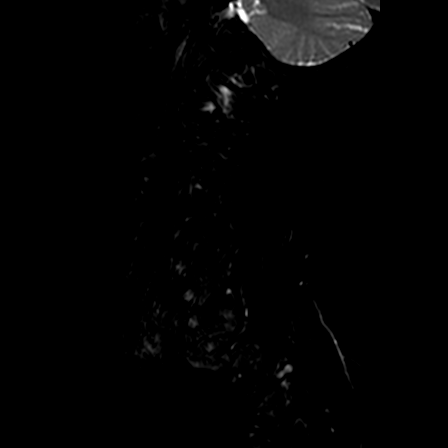
[im 5/15]
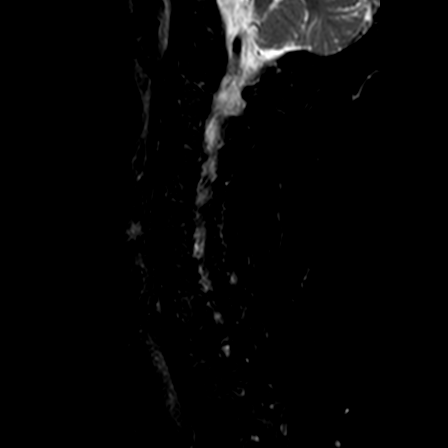

[26 of 48 positions shown; findings below may reference images not displayed]

FINDINGS: C5-C7 ACDF. There is solid osseous bridging across C5-C6 and C6-C7 disc spaces. 
No abnormal enhancement. There appears to be early osseous fusion across the 
right C5-C6 and C6-C7 posterior facet. 
-------------------------------------------------------------------------------- 
----------------- 
GENERAL: 
ALIGNMENT: Minimal dextroconvex cervical thoracic scoliosis. Trace stepwise 
grade 1 retrolisthesis C3 on C4 and C4 on C5. 
VERTEBRAL BODY HEIGHT: Normal.  
MARROW SIGNAL: Presumed hemangioma within the base of the dens, measuring 4 x 4 
millimeters. 
CORD SIGNAL: Normal.  No pathologic enhancement. 
ADDITIONAL FINDINGS: None. 
-------------------------------------------------------------------------------- 
---------------- 
SEGMENTAL:  
CRANIOCERVICAL JUNCTION: There is a 6 x 2 mm focus of hypointense T1 and 
hyperintense T2 signal residing anterior to the dens and immediately inferior to 
the anterior arch of C1. This may reflect a degenerative type cyst in this 
location. No significant stenosis. 
C2-C3: Mild loss of disc height. Canal and foramina are patent. Mild facet 
arthropathy bilaterally. 
C3-C4: Mild loss of disc height. Generalized disc osteophyte complex abuts and 
minimally indents the ventral cord margin with mild canal stenosis. Mild 
bilateral foraminal narrowing. Mild facet arthropathy. 
C4-C5: Mild loss of disc height. Posterior ligamentous hypertrophy with 
generalized disc osteophyte complex and mild-to-moderate canal stenosis. There 
is slight ventral cord deformity. Mild bilateral foraminal narrowing and facet 
arthropathy. 
C5-C6: Fusion. Posterior osteophytic ridging abuts and minimally indents the 
ventral cord margin with mild to moderate canal stenosis. Foramina patent. 
Right-sided facet appears fused. 
C6-C7: Fusion. Canal patent. Right-sided nerve root sleeve cyst incidentally 
noted. Mild right and moderate left foraminal narrowing. Right facet is fused. 
C7-T1: Mild loss of disc height. Canal patent. Right foramen patent. Left 
foramen is moderately to severely narrowed. 
Visualized upper thoracic segments demonstrate mild left-sided foraminal 
narrowing T1-T2. 
-------------------------------------------------------------------------------- 
---------------
IMPRESSION: C5-C7 ACDF with solid osseous bridging across the disc spaces and early osseous 
fusion across the right C5-C6 and C6-C7 posterior facets. 
Cervical spondylosis but without critical central canal narrowing. No 
significant canal stenosis is at C4-C5 and C5-C6, mild-to-moderate. Multilevel 
foraminal narrowing as detailed above.

## 2022-12-27 IMAGING — MG MAMMOGRAPHY SCREENING BILATERAL 3[PERSON_NAME]
8 series · 8 of 24 positions shown · non-contrast
Comparison: Comparison was made to prior examinations.

Referring: SOCHEATRA P BOKACH, PA-C               
________________________________________________________________________________________________ 
MAMMOGRAPHY SCREENING BILATERAL 3TOSHI LUTES, 12/27/2022 [DATE]: 
CLINICAL INDICATION: Encounter for screening mammogram.
TECHNIQUE: Digital bilateral mammograms and 3-D Tomosynthesis were obtained. 
These were interpreted both primarily and with the aid of computer-aided 
detection system.  
BREAST DENSITY: (Level B) There are scattered areas of fibroglandular density.

[L CC]
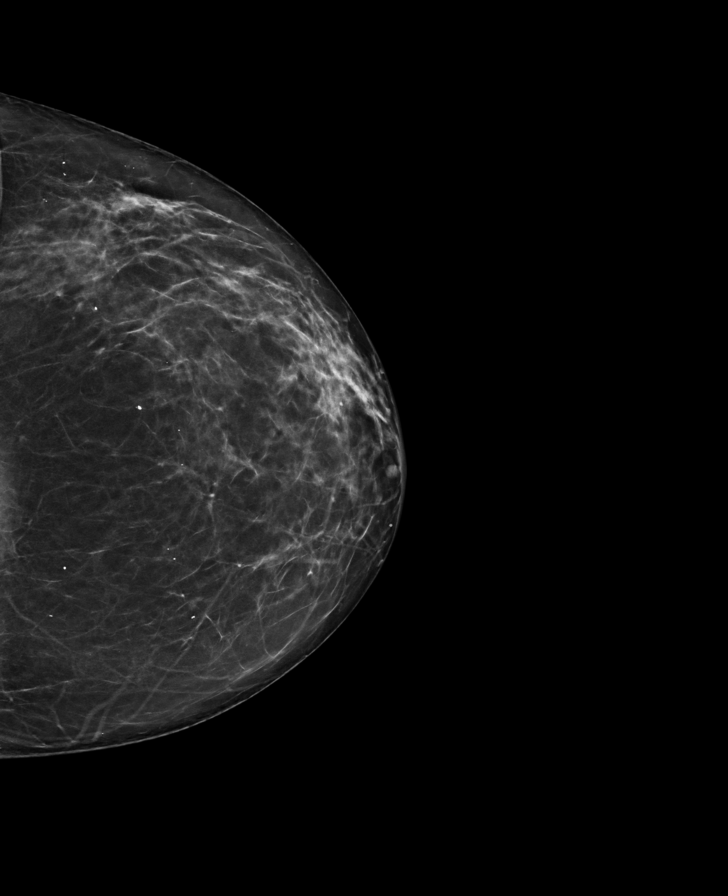

[L MLO]
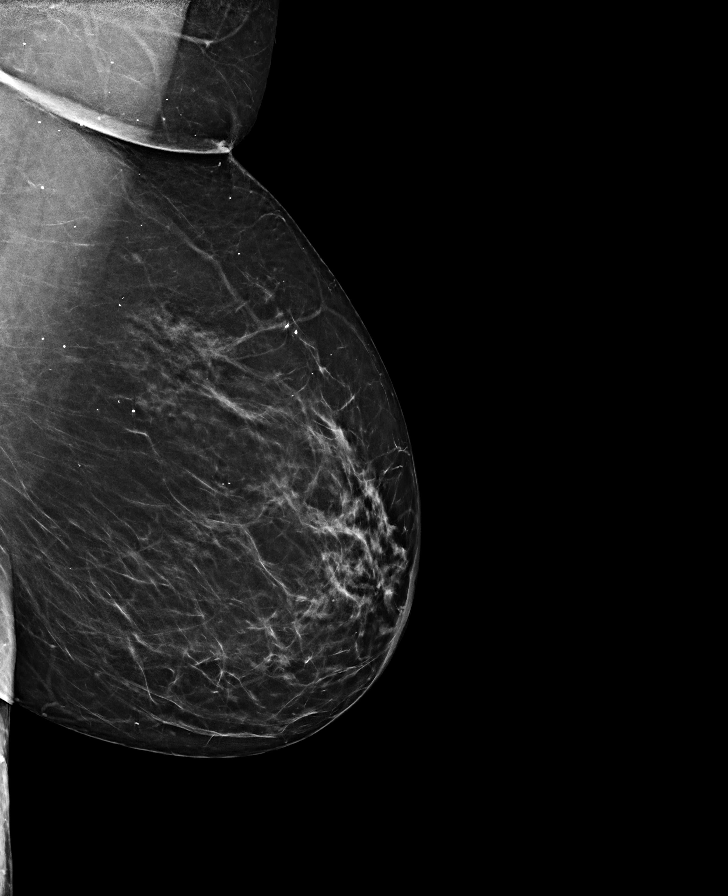

[R MLO]
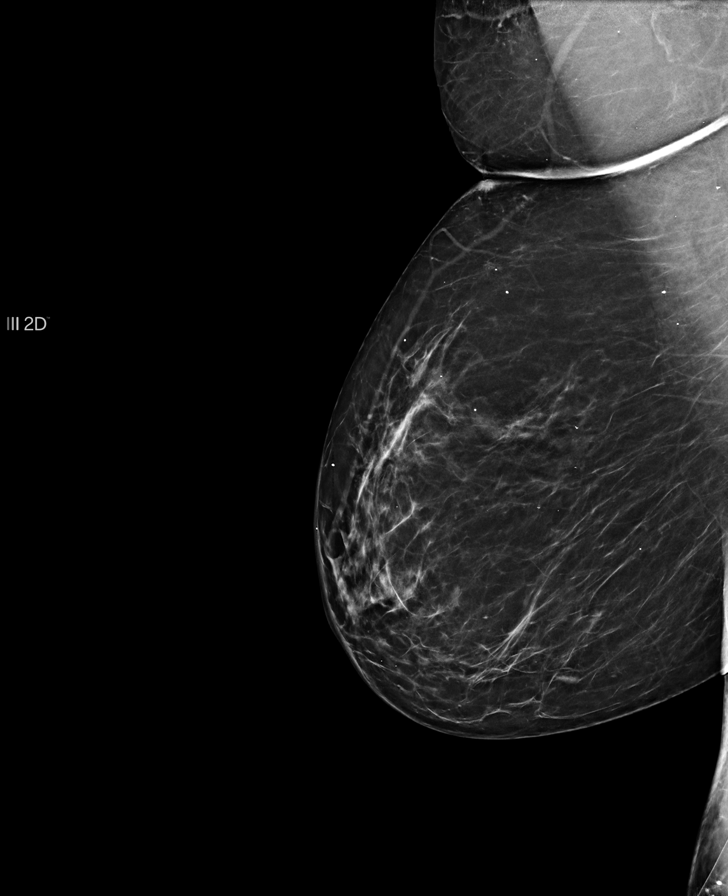

[R CC]
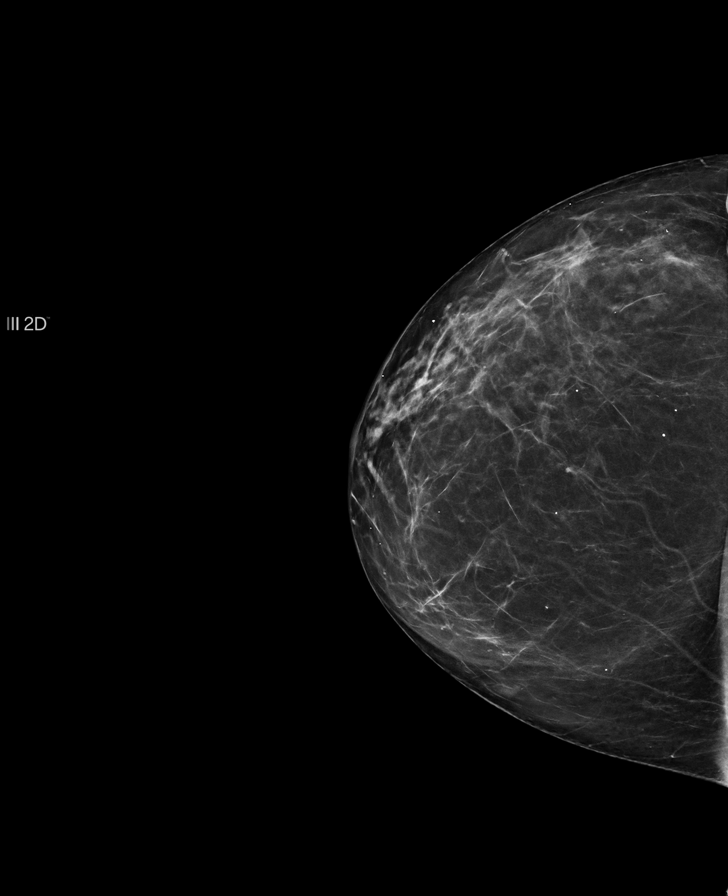

[L MLO tomo · tomo slice 13/25.0]
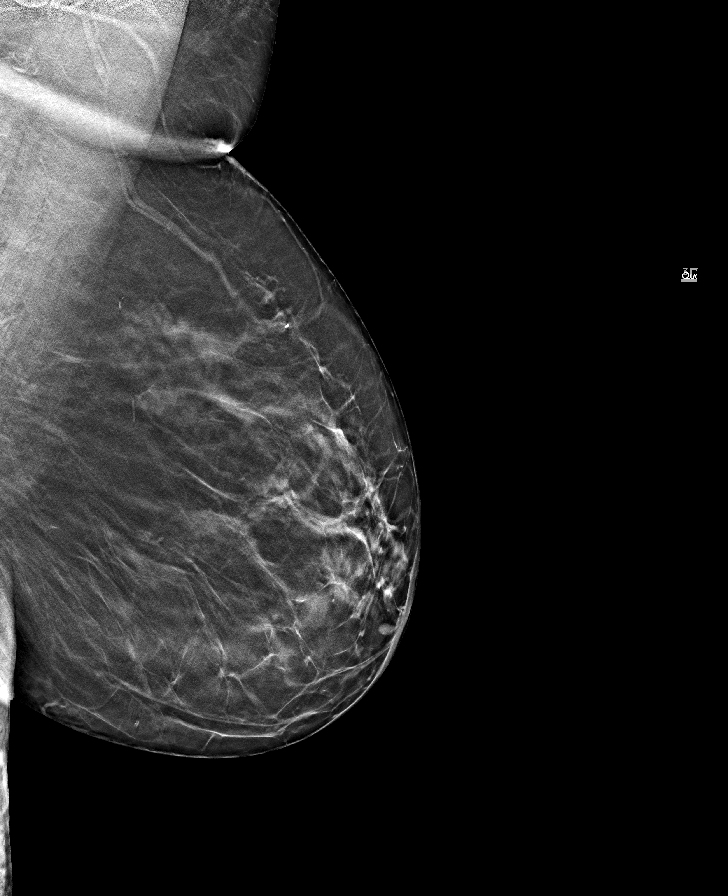

[R MLO tomo · tomo slice 13/24.0]
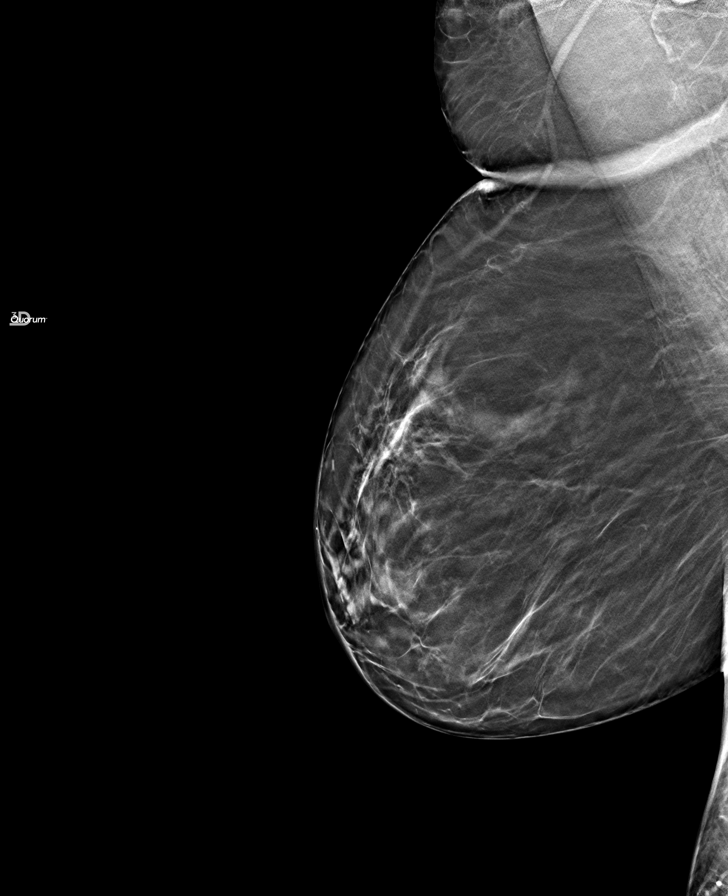

[R CC tomo · tomo slice 11/21.0]
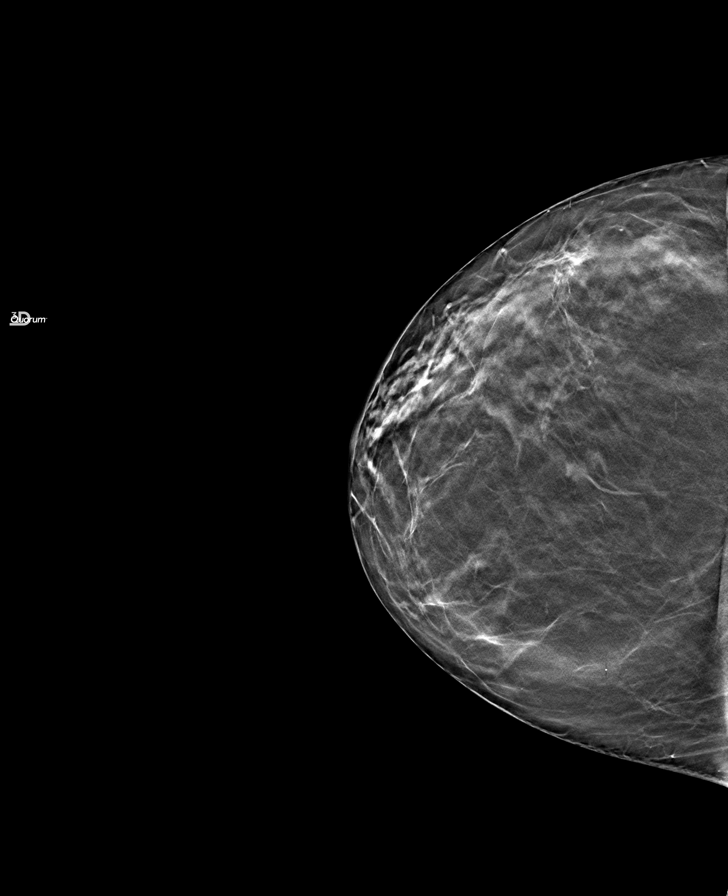

[L CC tomo · tomo slice 11/21.0]
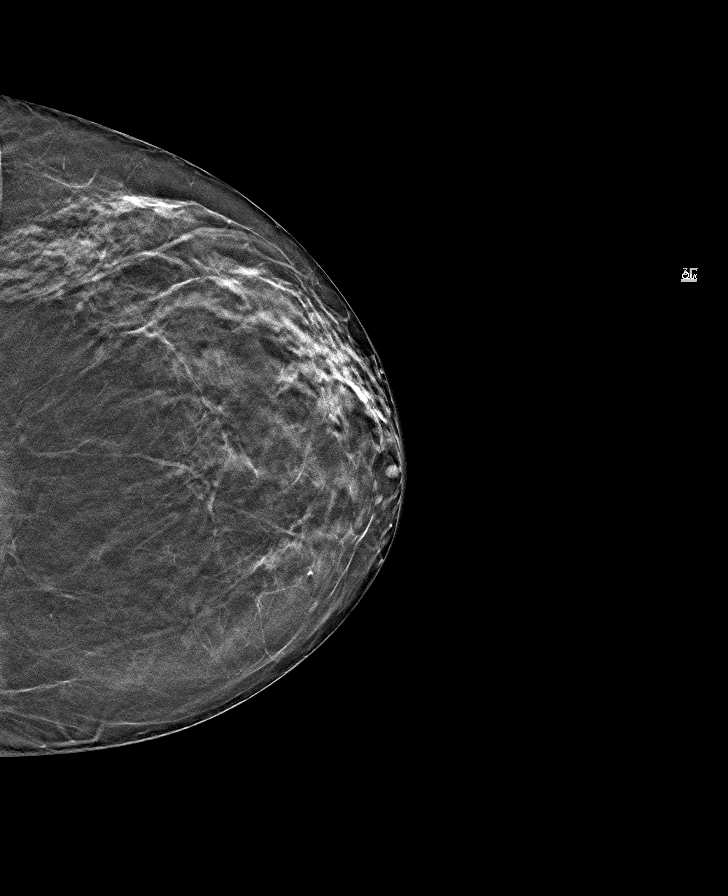

[8 of 24 positions shown; findings below may reference images not displayed]

FINDINGS: No suspicious mass, calcifications, or area of architectural 
distortion in either breast.
IMPRESSION: Stable exam. 
(BI-RADS 2) Benign findings. Routine mammographic follow-up is recommended.
